# Patient Record
Sex: Male | Born: 1935 | Race: White | Hispanic: No | Smoking: Current every day smoker
Health system: Southern US, Community
[De-identification: ages and names within clinical notes are randomized; demographics above are authoritative.]

## PROBLEM LIST (undated history)

## (undated) DIAGNOSIS — D649 Anemia, unspecified: Secondary | ICD-10-CM

## (undated) DIAGNOSIS — D126 Benign neoplasm of colon, unspecified: Secondary | ICD-10-CM

## (undated) DIAGNOSIS — E785 Hyperlipidemia, unspecified: Secondary | ICD-10-CM

## (undated) DIAGNOSIS — L259 Unspecified contact dermatitis, unspecified cause: Secondary | ICD-10-CM

## (undated) DIAGNOSIS — K5730 Diverticulosis of large intestine without perforation or abscess without bleeding: Secondary | ICD-10-CM

## (undated) DIAGNOSIS — N529 Male erectile dysfunction, unspecified: Secondary | ICD-10-CM

## (undated) DIAGNOSIS — I10 Essential (primary) hypertension: Secondary | ICD-10-CM

## (undated) DIAGNOSIS — I4891 Unspecified atrial fibrillation: Secondary | ICD-10-CM

## (undated) DIAGNOSIS — C4491 Basal cell carcinoma of skin, unspecified: Secondary | ICD-10-CM

## (undated) DIAGNOSIS — J45909 Unspecified asthma, uncomplicated: Secondary | ICD-10-CM

## (undated) DIAGNOSIS — M109 Gout, unspecified: Secondary | ICD-10-CM

## (undated) HISTORY — PX: TONSILLECTOMY: SUR1361

## (undated) HISTORY — DX: Diverticulosis of large intestine without perforation or abscess without bleeding: K57.30

## (undated) HISTORY — DX: Unspecified asthma, uncomplicated: J45.909

## (undated) HISTORY — DX: Unspecified contact dermatitis, unspecified cause: L25.9

## (undated) HISTORY — DX: Anemia, unspecified: D64.9

## (undated) HISTORY — DX: Unspecified atrial fibrillation: I48.91

## (undated) HISTORY — DX: Essential (primary) hypertension: I10

## (undated) HISTORY — DX: Hyperlipidemia, unspecified: E78.5

## (undated) HISTORY — DX: Gout, unspecified: M10.9

## (undated) HISTORY — DX: Benign neoplasm of colon, unspecified: D12.6

## (undated) HISTORY — DX: Male erectile dysfunction, unspecified: N52.9

## (undated) HISTORY — DX: Basal cell carcinoma of skin, unspecified: C44.91

---

## 1997-10-01 ENCOUNTER — Ambulatory Visit (HOSPITAL_BASED_OUTPATIENT_CLINIC_OR_DEPARTMENT_OTHER): Admission: RE | Admit: 1997-10-01 | Discharge: 1997-10-01 | Payer: Self-pay | Admitting: Plastic Surgery

## 2000-12-25 ENCOUNTER — Encounter: Payer: Self-pay | Admitting: Emergency Medicine

## 2000-12-25 ENCOUNTER — Emergency Department (HOSPITAL_COMMUNITY): Admission: EM | Admit: 2000-12-25 | Discharge: 2000-12-25 | Payer: Self-pay | Admitting: *Deleted

## 2003-01-09 ENCOUNTER — Encounter: Admission: RE | Admit: 2003-01-09 | Discharge: 2003-01-09 | Payer: Self-pay

## 2003-04-30 ENCOUNTER — Encounter (INDEPENDENT_AMBULATORY_CARE_PROVIDER_SITE_OTHER): Payer: Self-pay | Admitting: Specialist

## 2003-04-30 ENCOUNTER — Ambulatory Visit (HOSPITAL_COMMUNITY): Admission: RE | Admit: 2003-04-30 | Discharge: 2003-04-30 | Payer: Self-pay | Admitting: Gastroenterology

## 2007-08-06 ENCOUNTER — Ambulatory Visit: Payer: Self-pay | Admitting: Vascular Surgery

## 2010-05-17 ENCOUNTER — Encounter: Payer: Self-pay | Admitting: Internal Medicine

## 2010-05-17 ENCOUNTER — Encounter (INDEPENDENT_AMBULATORY_CARE_PROVIDER_SITE_OTHER): Payer: Self-pay | Admitting: *Deleted

## 2010-05-17 ENCOUNTER — Ambulatory Visit (INDEPENDENT_AMBULATORY_CARE_PROVIDER_SITE_OTHER): Payer: Medicare Other | Admitting: Internal Medicine

## 2010-05-17 ENCOUNTER — Other Ambulatory Visit: Payer: Medicare Other

## 2010-05-17 ENCOUNTER — Other Ambulatory Visit: Payer: Self-pay | Admitting: Internal Medicine

## 2010-05-17 DIAGNOSIS — M109 Gout, unspecified: Secondary | ICD-10-CM | POA: Insufficient documentation

## 2010-05-17 DIAGNOSIS — I10 Essential (primary) hypertension: Secondary | ICD-10-CM

## 2010-05-17 DIAGNOSIS — N529 Male erectile dysfunction, unspecified: Secondary | ICD-10-CM | POA: Insufficient documentation

## 2010-05-17 DIAGNOSIS — K5730 Diverticulosis of large intestine without perforation or abscess without bleeding: Secondary | ICD-10-CM | POA: Insufficient documentation

## 2010-05-17 DIAGNOSIS — L259 Unspecified contact dermatitis, unspecified cause: Secondary | ICD-10-CM | POA: Insufficient documentation

## 2010-05-17 DIAGNOSIS — E785 Hyperlipidemia, unspecified: Secondary | ICD-10-CM

## 2010-05-17 DIAGNOSIS — J45909 Unspecified asthma, uncomplicated: Secondary | ICD-10-CM | POA: Insufficient documentation

## 2010-05-17 DIAGNOSIS — D126 Benign neoplasm of colon, unspecified: Secondary | ICD-10-CM | POA: Insufficient documentation

## 2010-05-17 DIAGNOSIS — C4491 Basal cell carcinoma of skin, unspecified: Secondary | ICD-10-CM | POA: Insufficient documentation

## 2010-05-17 LAB — HEPATIC FUNCTION PANEL
ALT: 18 U/L (ref 0–53)
AST: 17 U/L (ref 0–37)
Albumin: 4.1 g/dL (ref 3.5–5.2)
Alkaline Phosphatase: 73 U/L (ref 39–117)
Bilirubin, Direct: 0 mg/dL (ref 0.0–0.3)
Total Bilirubin: 0.6 mg/dL (ref 0.3–1.2)
Total Protein: 7.1 g/dL (ref 6.0–8.3)

## 2010-05-17 LAB — LIPID PANEL
Cholesterol: 245 mg/dL — ABNORMAL HIGH (ref 0–200)
HDL: 36.4 mg/dL — ABNORMAL LOW (ref >39.00)
Total CHOL/HDL Ratio: 7
Triglycerides: 351 mg/dL — ABNORMAL HIGH (ref 0.0–149.0)
VLDL: 70.2 mg/dL — ABNORMAL HIGH (ref 0.0–40.0)

## 2010-05-17 LAB — BASIC METABOLIC PANEL
BUN: 28 mg/dL — ABNORMAL HIGH (ref 6–23)
CO2: 29 mEq/L (ref 19–32)
Calcium: 9.1 mg/dL (ref 8.4–10.5)
Chloride: 103 mEq/L (ref 96–112)
Creatinine, Ser: 1.2 mg/dL (ref 0.4–1.5)
GFR: 62.25 mL/min (ref >60.00)
Glucose, Bld: 77 mg/dL (ref 70–99)
Potassium: 4.7 mEq/L (ref 3.5–5.1)
Sodium: 141 mEq/L (ref 135–145)

## 2010-05-17 LAB — URIC ACID: Uric Acid, Serum: 8.3 mg/dL — ABNORMAL HIGH (ref 4.0–7.8)

## 2010-05-18 ENCOUNTER — Telehealth: Payer: Self-pay | Admitting: Internal Medicine

## 2010-05-18 LAB — LDL CHOLESTEROL, DIRECT: Direct LDL: 150.5 mg/dL

## 2010-05-24 ENCOUNTER — Ambulatory Visit (INDEPENDENT_AMBULATORY_CARE_PROVIDER_SITE_OTHER): Payer: Medicare Other | Admitting: Internal Medicine

## 2010-05-24 ENCOUNTER — Encounter: Payer: Self-pay | Admitting: Internal Medicine

## 2010-05-24 DIAGNOSIS — I10 Essential (primary) hypertension: Secondary | ICD-10-CM

## 2010-05-24 DIAGNOSIS — E785 Hyperlipidemia, unspecified: Secondary | ICD-10-CM

## 2010-05-24 DIAGNOSIS — N529 Male erectile dysfunction, unspecified: Secondary | ICD-10-CM

## 2010-05-24 DIAGNOSIS — M109 Gout, unspecified: Secondary | ICD-10-CM

## 2010-05-27 NOTE — Progress Notes (Signed)
  Phone Note Outgoing Call   Reason for Call: Get patient information Summary of Call: I was supposed to have called in a 30 day supply of his lisinopril/hct 20/12.5 but did not and do not remember his pharmacy. Help please.  Thanks Initial call taken by: Jacques Navy MD,  May 18, 2010 8:31 AM    Prescriptions: LISINOPRIL-HYDROCHLOROTHIAZIDE 20-12.5 MG TABS (LISINOPRIL-HYDROCHLOROTHIAZIDE) 1 tablet every morning  #30 x 6   Entered by:   Ami Bullins CMA   Authorized by:   Jacques Navy MD   Signed by:   Bill Salinas CMA on 05/18/2010   Method used:   Electronically to        [id number] (retail)       [address]       [place], [place]  [address]       Ph: [phone]       Fax: [phone]   RxID:   [id number] LISINOPRIL-HYDROCHLOROTHIAZIDE 20-12.5 MG TABS (LISINOPRIL-HYDROCHLOROTHIAZIDE) 1 tablet every morning  #30 x 6   Entered and Authorized by:   Ami Bullins CMA   Signed by:   Ami Bullins CMA on 05/18/2010   Method used:   Print then Give to Patient   RxID:   [id number]

## 2010-05-27 NOTE — Assessment & Plan Note (Signed)
Summary: new pt/bcbs/medicare/#/lb   Vital Signs:  Patient profile:   77 year old male Height:      67.5 inches Weight:      167 pounds BMI:     25.86 O2 Sat:      97 % on Room air Temp:     97.4 degrees F oral Pulse rate:   62 / minute BP sitting:   190 / 72  (left arm) Cuff size:   regular  Vitals Entered By: Bill Salinas CMA (May 17, 2010 3:27 PM)  O2 Flow:  Room air  Primary Care Provider:  Illene Regulus   History of Present Illness: Mr. Devereux presents to establish for on-going continuity care after his physicians closed their office.  cc: Uncontrolled blood pressure. His BP is high but he is asymptomatic.   He has no complaints otherwise. He is not a "doctor goer" and considers himself to be in good health. He has not had an eye exam in years. He doesn't recall when he last had any lab work. He reports he has had a colonoscopy in the last 10 years.     Preventive Screening-Counseling & Management  Alcohol-Tobacco     Alcohol drinks/day: <1     Alcohol type: beer, whiskey     Smoking Status: current     Packs/Day: 1.0  Caffeine-Diet-Exercise     Caffeine use/day: 1 cup per day     Does Patient Exercise: no  Hep-HIV-STD-Contraception     Dental Visit-last 6 months no     Sun Exposure-Excessive: no  Safety-Violence-Falls     Seat Belt Use: yes     Helmet Use: n/a     Smoke Detectors: yes     Violence in the Home: no risk noted     Sexual Abuse: no     Fall Risk: low fall risk      Blood Transfusions:  no.    Current Medications (verified): 1)  Lisinopril-Hydrochlorothiazide 20-12.5 Mg Tabs (Lisinopril-Hydrochlorothiazide) .Marland Kitchen.. 1 Tablet Every Morning  Allergies (verified): 1)  ! Prednisone 2)  ! Codeine 3)  ! Neosporin 4)  ! * Topical Steroids  Past History:  Past Medical History: ERECTILE DYSFUNCTION, ORGANIC (ICD-607.84) CONTACT DERMATITIS&OTHER ECZEMA DUE UNSPEC CAUSE (ICD-692.9) COLONIC POLYPS, ADENOMATOUS (ICD-211.3) GOUT,  UNSPECIFIED (ICD-274.9) HYPERLIPIDEMIA (ICD-272.4) LABILE HYPERTENSION (ICD-401.9) DIVERTICULOSIS OF COLON (ICD-562.10) BASAL CELL CARCINOMA OF SKIN SITE UNSPECIFIED (ICD-173.91) ASTHMA, CHILDHOOD (ICD-493.00)     Physician Roser          Cardiologist - Dr. Peter Swaziland           Dermatologist - Dr. Danella Deis            Past Surgical History: Tonsillectomy  Family History: Father- deceased @ 68: CAD/MI Mother - deceased @ 20 - CVA, HTN Neg- prostate or colon cancer; DM  Social History: [employer], [employer] then [employer]; Master's [employer] - mathematics Married '62 2 dtrs - '67, '70: no grandchildren work- high school and college Editor, commissioning - retired. 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 Marriage in good health; wife with COPDSmoking Status:  current Packs/Day:  1.0 Caffeine use/day:  1 cup per day Does Patient Exercise:  no Dental Care w/in 6 mos.:  no Sun Exposure-Excessive:  no Seat Belt Use:  yes Fall Risk:  low fall risk Blood Transfusions:  no  Review of Systems       The patient complains of decreased hearing.  The patient denies anorexia, fever,  weight loss, weight gain, vision loss, chest pain, syncope, dyspnea on exertion, peripheral edema, prolonged cough, headaches, abdominal pain, severe indigestion/heartburn, incontinence, muscle weakness, difficulty walking, depression, abnormal bleeding, enlarged lymph nodes, and angioedema.    Physical Exam  General:  alert, well-developed, well-nourished, well-hydrated, and normal appearance.   Head:  normocephalic and atraumatic.  Bit of droop to the right eye Eyes:  vision grossly intact, pupils equal, and pupils round.  Both lenses have increased opacity with hand held opthalmoscope. Fundiscopic exam was hindered.  Ears:  R ear normal and L ear normal.   Nose:  no external deformity and no external erythema.   Mouth:  Oral mucosa and oropharynx without lesions or exudates.   Teeth in good repair. Neck:  supple, full ROM, no masses, no thyromegaly, and no carotid bruits.   Chest Wall:  No deformities, masses, tenderness or gynecomastia noted. Lungs:  Normal respiratory effort, chest expands symmetrically. Lungs are clear to auscultation, no crackles or wheezes. Heart:  Normal rate and regular rhythm. S1 and S2 normal without gallop, murmur, click, rub or other extra sounds. Abdomen:  soft, non-tender, normal bowel sounds, and no guarding.   Msk:  normal ROM, no joint tenderness, no joint swelling, and no joint instability.   Pulses:  2+ radial pulse Extremities:  No clubbing, cyanosis, edema, or deformity noted with normal full range of motion of all joints.   Neurologic:  alert & oriented X3, cranial nerves II-XII intact, strength normal in all extremities, sensation intact to light touch, gait normal, and DTRs symmetrical and normal.   Skin:  turgor normal, color normal, and no suspicious lesions.   Cervical Nodes:  no anterior cervical adenopathy and no posterior cervical adenopathy.   Psych:  Oriented X3, memory intact for recent and remote, normally interactive, good eye contact, and not anxious appearing.     Impression & Recommendations:  Problem # 1:  ERECTILE DYSFUNCTION, ORGANIC (ICD-607.84) INterested in a trial of viagra. Will provide samples at next office visit.  Problem # 2:  COLONIC POLYPS, ADENOMATOUS (ICD-211.3) By report he had precancerous polyp(s). He was to have repeat colonoscopy 5 years from the date of his last study.   Problem # 3:  GOUT, UNSPECIFIED (ICD-274.9) Reports that he has less than one flare a year. Currently asymptomatic.  Plan - uric acid level for baseline.   Orders: TLB-Uric Acid, Blood (84550-URIC)  Addendum - uric acid 8.3 - high.  Plan  low purine diet         for more than 2 flares in 12 months will recommend allopurinol.  Problem # 4:  HYPERLIPIDEMIA (ICD-272.4) Reports this diagnosis but doesn't know his  last lipid readings and hasn't had lab for approximately 1 year.  Plan - lab with recommendations to follow.  Orders: TLB-Lipid Panel (80061-LIPID) TLB-Hepatic/Liver Function Pnl (80076-HEPATIC)  addendum - LDL 150 - greater than goal of 130 or less but not at NCEP treatment threshold for low-moderate risk patient.  Plan - life style managment with low fat diet and regular aerobic exercise.  Problem # 5:  LABILE HYPERTENSION (ICD-401.9)  His updated medication list for this problem includes:    Lisinopril-hydrochlorothiazide 20-12.5 Mg Tabs (Lisinopril-hydrochlorothiazide) .Marland Kitchen... 1 tablet every morning  Orders: TLB-BMP (Basic Metabolic Panel-BMET) (80048-METABOL)  BP today: 190/72; on Doctor's repeat: 178/80 right arm; 164/84 left arm.  Plan - trial of Lisinopril/HCT 40/25 (doubling his present dose)          return in 1 week for follow-up  routine lab- Bmet  Problem # 6:  Preventive Health Care (ICD-V70.0) Does not have any complaints except for high BP. His exam is suggestive of cataracts and he admits to increased light refraction and slight dimming of vision. He is advised to see an opthalmologist. Limited physical exam was unremarkable. Lab results are within normal limits except for moderately elevated LDL cholesterol. Immunizations: tetnus May '10; shingles Nov '11; due for pneumonia vaccine. Colorectal cancer screening - will request copy of last colonoscopy from patient with recommendations to follow.   Complete Medication List: 1)  Lisinopril-hydrochlorothiazide 20-12.5 Mg Tabs (Lisinopril-hydrochlorothiazide) .Marland Kitchen.. 1 tablet every morning   Patient: Christopher Rubio Note: All result statuses are Final unless otherwise noted.  Tests: (1) Lipid Panel (LIPID)   Cholesterol          [H]  245 mg/dL                   4-132     ATP III Classification            Desirable:  < 200 mg/dL                    Borderline High:  200 - 239 mg/dL               High:  > = 240  mg/dL   Triglycerides        [H]  351.0 mg/dL                 4.4-010.2     Normal:  <150 mg/dL     Borderline High:  725 - 199 mg/dL   HDL                  [L]  36.64 mg/dL                 >40.34   VLDL Cholesterol     [H]  70.2 mg/dL                  7.4-25.9  CHO/HDL Ratio:  CHD Risk                             7                    Men          Women     1/2 Average Risk     3.4          3.3     Average Risk          5.0          4.4     2X Average Risk          9.6          7.1     3X Average Risk          15.0          11.0                           Tests: (2) Hepatic/Liver Function Panel (HEPATIC)   Total Bilirubin           0.6 mg/dL                   5.6-3.8   Direct Bilirubin  0.0 mg/dL                   1.6-1.0   Alkaline Phosphatase      73 U/L                      39-117   AST                       17 U/L                      0-37   ALT                       18 U/L                      0-53   Total Protein             7.1 g/dL                    9.6-0.4   Albumin                   4.1 g/dL                    5.4-0.9  Tests: (3) Uric Acid (URIC)   Uric Acid            [H]  8.3 mg/dL                   8.1-1.9  Tests: (4) BMP (METABOL)   Sodium                    141 mEq/L                   135-145   Potassium                 4.7 mEq/L                   3.5-5.1   Chloride                  103 mEq/L                   96-112   Carbon Dioxide            29 mEq/L                    19-32   Glucose                   77 mg/dL                    14-78   BUN                  [H]  28 mg/dL                    2-95   Creatinine                1.2 mg/dL                   6.2-1.3   Calcium                   9.1 mg/dL  8.4-10.5   GFR                       62.25 mL/min                >60.00  Tests: (5) Cholesterol LDL - Direct (DIRLDL)  Cholesterol LDL - Direct                             150.5 mg/dL     Optimal:  <478 mg/dL     Near or Above Optimal:   100-129 mg/dL     Borderline High:  295-621 mg/dL     High:  308-657 mg/dL     Very High:  >846 mg/dL  Orders Added: 1)  TLB-Lipid Panel [80061-LIPID] 2)  TLB-Hepatic/Liver Function Pnl [80076-HEPATIC] 3)  TLB-Uric Acid, Blood [84550-URIC] 4)  TLB-BMP (Basic Metabolic Panel-BMET) [80048-METABOL] 5)  New Patient Level IV [96295]   Immunization History:  Zostavax History:    Zostavax # 1:  zostavax (02/13/2010)   Immunization History:  Zostavax History:    Zostavax # 1:  Zostavax (02/13/2010)   Preventive Care Screening  Last Tetanus Booster:    Date:  08/10/2008    Results:  Historical

## 2010-06-01 ENCOUNTER — Encounter: Payer: Self-pay | Admitting: Internal Medicine

## 2010-06-02 NOTE — Assessment & Plan Note (Signed)
Summary: bp/fu/lb   Vital Signs:  Patient profile:   77 year old male Height:      67.5 inches Weight:      166 pounds BMI:     25.71 O2 Sat:      96 % on Room air Temp:     97.4 degrees F oral Pulse rate:   58 / minute BP sitting:   180 / 84  (left arm) Cuff size:   regular  Vitals Entered By: Bill Salinas CMA (May 24, 2010 9:06 AM)  O2 Flow:  Room air CC: follow-up visit on bp/ ab   Primary Care Provider:  Illene Regulus  CC:  follow-up visit on bp/ ab.  History of Present Illness: Mr. Barca returns for follow-up of blood pressure. He was recently seen as a new patient and that record is reviewed. He is seen with the assistance of Candelaria Celeste, NP-student.  His blood pressure remains elevated despite increase in lisinopril/hct. He is asymptomatic.  Reviewed his labs: LDL was 150 range. Discussed NCEP guidelines: treatment threshold of 160+.  He has a h/o gout and uric acid level was elevated on his lab - 8.6. He does not have frequent flares.   Current Medications (verified): 1)  Lisinopril-Hydrochlorothiazide 20-12.5 Mg Tabs (Lisinopril-Hydrochlorothiazide) .Marland Kitchen.. 1 Tablet Every Morning 2)  Vitamin D .... 1 Daily 3)  Aspirin 81 Mg Tbec (Aspirin) .Marland Kitchen.. 1 Tablet Once A Day 4)  Vitamin B-12 1000 Mcg Tabs (Cyanocobalamin) 5)  Fish Oil 1000 Mg Caps (Omega-3 Fatty Acids) .Marland Kitchen.. 1 Capsule Daiily  Allergies (verified): 1)  ! Prednisone 2)  ! Codeine 3)  ! Neosporin 4)  ! * Topical Steroids PMH-FH-SH reviewed-no changes except otherwise noted  Review of Systems  The patient denies fever, weight loss, chest pain, dyspnea on exertion, headaches, abdominal pain, severe indigestion/heartburn, incontinence, muscle weakness, difficulty walking, and abnormal bleeding.    Physical Exam  General:  Well-developed,well-nourished,in no acute distress; alert,appropriate and cooperative throughout examination Head:  normocephalic and atraumatic.   Eyes:  C&S clear Lungs:   normal respiratory effort and normal breath sounds.   Heart:  normal rate and regular rhythm.     Impression & Recommendations:  Problem # 1:  ERECTILE DYSFUNCTION, ORGANIC (EAV-409.81) Patient had raised this issue at his inital visit. He has not had chest pain and does use any nitrate product  Plan - trail of viagra - sample provied - instructed to take 1/2 100mg  tab to start.  Problem # 2:  GOUT, UNSPECIFIED (ICD-274.9) No recent flares. Uric acid elevated per HPI  Plan - he is provided information about a low purine diet           will not start allopurinol unless he begins to have more flares           wil decrease HCT to previous low dose.  Problem # 3:  HYPERLIPIDEMIA (ICD-272.4)  Labs Reviewed: SGOT: 17 (05/17/2010)   SGPT: 18 (05/17/2010)   HDL:36.40 (05/17/2010)  LDL 150.5 (05/17/2010 Chol:245 (05/17/2010)  Trig:351.0 (05/17/2010)  Plan - patient provided information on low cholesterol diet           recheck lipid panel in  6 months.  Problem # 4:  LABILE HYPERTENSION (ICD-401.9) Blood pressure remains poorly controlled. He is asymptomatic.  Plan - resume lisinopril/hct at 20/12.5 once daily            add taztia XT 180 mg once daily.  return for BP check in 1-2 weeks, bring home monitor for calibration  His updated medication list for this problem includes:    Lisinopril-hydrochlorothiazide 20-12.5 Mg Tabs (Lisinopril-hydrochlorothiazide) .Marland Kitchen... 1 tablet every morning    Taztia Xt 180 Mg Xr24h-cap (Diltiazem hcl er beads) .Marland Kitchen... 1 by mouth once daily for blood pressure  Complete Medication List: 1)  Lisinopril-hydrochlorothiazide 20-12.5 Mg Tabs (Lisinopril-hydrochlorothiazide) .Marland Kitchen.. 1 tablet every morning 2)  Vitamin D  .... 1 daily 3)  Aspirin 81 Mg Tbec (Aspirin) .Marland Kitchen.. 1 tablet once a day 4)  Vitamin B-12 1000 Mcg Tabs (Cyanocobalamin) 5)  Fish Oil 1000 Mg Caps (Omega-3 fatty acids) .Marland Kitchen.. 1 capsule daiily 6)  Taztia Xt 180 Mg Xr24h-cap (Diltiazem hcl er  beads) .Marland Kitchen.. 1 by mouth once daily for blood pressure Prescriptions: TAZTIA XT 180 MG XR24H-CAP (DILTIAZEM HCL ER BEADS) 1 by mouth once daily for blood pressure  #30 x 12   Entered and Authorized by:   Jacques Navy MD   Signed by:   Jacques Navy MD on 05/24/2010   Method used:   Electronically to        [facility] (retail)       [address]       [place], [place]  [address]       Ph: [phone]       Fax: [phone]   RxID:   [id number]    Orders Added: 1)  Est. Patient Level III [40102]

## 2010-06-07 ENCOUNTER — Ambulatory Visit: Payer: Medicare Other

## 2010-06-07 ENCOUNTER — Encounter: Payer: Self-pay | Admitting: Internal Medicine

## 2010-06-22 NOTE — Assessment & Plan Note (Signed)
Summary: bp check  Nurse Visit   Vital Signs:  Patient profile:   77 year old male BP sitting:   156 / 70  (left arm) Cuff size:   regular  Vitals Entered By: Bill Salinas CMA (June 07, 2010 9:15 AM)  Allergies: 1)  ! Prednisone 2)  ! Codeine 3)  ! Neosporin 4)  ! * Topical Steroids

## 2010-06-22 NOTE — Consult Note (Signed)
Summary: [facility]   Imported By: Sherian Rein 06/15/2010 12:44:37  _____________________________________________________________________  External Attachment:    Type:   Image     Comment:   External Document

## 2010-08-27 NOTE — Op Note (Signed)
NAME:  Christopher Rubio, Christopher Rubio                        ACCOUNT NO.:  [id number]   MEDICAL RECORD NO.:  [id number]                   PATIENT TYPE:  AMB   LOCATION:  ENDO                                 FACILITY:  [facility]   PHYSICIAN:  Anselmo Rod, M.D.               DATE OF BIRTH:  [birth year]   DATE OF PROCEDURE:  04/30/2003  DATE OF DISCHARGE:                                 OPERATIVE REPORT   PROCEDURE:  Colonoscopy with cold biopsies x6.   ENDOSCOPIST:  Anselmo Rod, M.D.   INSTRUMENT USED:  Olympus video colonoscope.   INDICATIONS FOR PROCEDURE:  A 77 year old white male undergoing screening  colonoscopy, rule out colonic polyps, masses, etc.   PREPROCEDURE PREPARATION:  Informed consent was obtained from the patient  and the patient was  fasted for 8 hours prior to  the procedure and prepped  with a bottle of magnesium citrate and a gallon of GoLYTELY the  night prior  to the procedure.   PREPROCEDURE PHYSICAL:  The patient had stable vital signs. Neck supple.  Chest clear to auscultation, S1, S2 regular. Abdomen soft with normoactive  bowel sounds.   DESCRIPTION OF PROCEDURE:  The patient was placed in the left lateral  decubitus position and sedated with 80 mg of Demerol and 8 mg of Versed  intravenously. Once sedation was adequate, the patient was maintained on low  flow oxygen and continuous cardiac monitoring.   The Olympus video colonoscope was advanced from the rectum to the cecum  without difficulty. The patient had scattered diverticula throughout the  colon with some diverticula in the proximal right and distal left colon.  Some of the diverticula had stool impacted in them. Prominent internal  hemorrhoids were seen on retroflexion. These were not bleeding at the time  of examination.   Four small  sessile polyps were biopsied from 20 cm. The terminal ileum  appeared  normal. The appendiceal orifice and ileocecal valve were clearly  visualized and  photographed.  The patient tolerated the procedure well  without complications.   IMPRESSION:  1. Prominent internal hemorrhoids.  2. Four small sessile polyps biopsied from 20 cm.  3. Scattered diverticulosis with small prominent change in the proximal     right and distal left colon.  4. Normal terminal ileum.   RECOMMENDATIONS:  1. Await pathology results.  2. Avoid nonsteroidals including  aspirin .  3. A high fiber diet with liberal fluid intake has been advocated. Brochures     on diverticulosis have been given to the patient for his education.  4. Outpatient follow up in the next 2 weeks. Repeat  colorectal cancer     screening will be scheduled depending on pathology results.   RECOMMENDATIONS:  Anselmo Rod, M.D.    JNM/MEDQ  D:  04/30/2003  T:  04/30/2003  Job:  [id number]   cc:   Talmadge Coventry, M.D.  [address]  [place]  [place] [address]  Fax: [phone]

## 2010-12-07 ENCOUNTER — Other Ambulatory Visit: Payer: Self-pay | Admitting: Internal Medicine

## 2011-03-21 ENCOUNTER — Other Ambulatory Visit: Payer: Self-pay | Admitting: Internal Medicine

## 2011-05-19 ENCOUNTER — Encounter: Payer: Self-pay | Admitting: Internal Medicine

## 2011-05-19 ENCOUNTER — Ambulatory Visit (INDEPENDENT_AMBULATORY_CARE_PROVIDER_SITE_OTHER): Payer: Medicare Other | Admitting: Internal Medicine

## 2011-05-19 DIAGNOSIS — M19071 Primary osteoarthritis, right ankle and foot: Secondary | ICD-10-CM

## 2011-05-19 DIAGNOSIS — I779 Disorder of arteries and arterioles, unspecified: Secondary | ICD-10-CM

## 2011-05-19 DIAGNOSIS — M19079 Primary osteoarthritis, unspecified ankle and foot: Secondary | ICD-10-CM

## 2011-05-19 DIAGNOSIS — I739 Peripheral vascular disease, unspecified: Secondary | ICD-10-CM

## 2011-05-19 MED ORDER — CILOSTAZOL 50 MG PO TABS: 50.0000 mg | ORAL_TABLET | Freq: Two times a day (BID) | ORAL | Status: AC

## 2011-05-19 NOTE — Patient Instructions (Signed)
1. Tender toe joints - most likely osteoarthritis. Use it or loose it - ok to walk, etc. For pain ok to use OTC anti-inflammatory, e.g. Ibuprofen  2. Leg pain with exertion = claudication. On exam weak pulses and femoral bruits suggestive of blocked arteries. Plan - do nothing, or a study - CT or MR angio or trial medication, i.e. Pletal 50mg  twice a day.   Osteoarthritis Osteoarthritis is the most common form of arthritis. It is redness, soreness, and swelling (inflammation) affecting the cartilage. Cartilage acts as a cushion, covering the ends of bones where they meet to form a joint. CAUSES   Over time, the cartilage begins to wear away. This causes bone to rub on bone. This produces pain and stiffness in the affected joints. Factors that contribute to this problem are:  Excessive body weight.     Age.    Overuse of joints.  SYMPTOMS    People with osteoarthritis usually experience joint pain, swelling, or stiffness.     Over time, the joint may lose its normal shape.     Small deposits of bone (osteophytes) may grow on the edges of the joint.     Bits of bone or cartilage can break off and float inside the joint space. This may cause more pain and damage.     Osteoarthritis can lead to depression, anxiety, feelings of helplessness, and limitations on daily activities.  The most commonly affected joints are in the:  Ends of the fingers.     Thumbs.    Neck.    Lower back.     Knees.    Hips.  DIAGNOSIS  Diagnosis is mostly based on your symptoms and exam. Tests may be helpful, including:  X-rays of the affected joint.     A computerized magnetic scan (MRI).     Blood tests to rule out other types of arthritis.     Joint fluid tests. This involves using a needle to draw fluid from the joint and examining the fluid under a microscope.  TREATMENT   Goals of treatment are to control pain, improve joint function, maintain a normal body weight, and maintain a healthy  lifestyle. Treatment approaches may include:  A prescribed exercise program with rest and joint relief.     Weight control with nutritional education.     Pain relief techniques such as:     Properly applied heat and cold.     Electric pulses delivered to nerve endings under the skin (transcutaneous electrical nerve stimulation, TENS).     Massage.    Certain supplements. Ask your caregiver before using any supplements, especially in combination with prescribed drugs.     Medicines to control pain, such as:     Acetaminophen.    Nonsteroidal anti-inflammatory drugs (NSAIDs), such as naproxen.     Narcotic or central-acting agents, such as tramadol. This drug carries a risk of addiction and is generally prescribed for short-term use.     Corticosteroids. These can be given orally or as injection. This is a short-term treatment, not recommended for routine use.     Surgery to reposition the bones and relieve pain (osteotomy) or to remove loose pieces of bone and cartilage. Joint replacement may be needed in advanced states of osteoarthritis.  HOME CARE INSTRUCTIONS   Your caregiver can recommend specific types of exercise. These may include:  Strengthening exercises. These are done to strengthen the muscles that support joints affected by arthritis. They can be performed with weights  or with exercise bands to add resistance.     Aerobic activities. These are exercises, such as brisk walking or low-impact aerobics, that get your heart pumping. They can help keep your lungs and circulatory system in shape.     Range-of-motion activities. These keep your joints limber.     Balance and agility exercises. These help you maintain daily living skills.  Learning about your condition and being actively involved in your care will help improve the course of your osteoarthritis. SEEK MEDICAL CARE IF:    You feel hot or your skin turns red.     You develop a rash in addition to your joint  pain.     You have an oral temperature above 102 F (38.9 C).  FOR MORE INFORMATION   National Institute of Arthritis and Musculoskeletal and Skin Diseases: www.niams.http://www.myers.net/ [employer] on Aging: https://walker.com/ American College of Rheumatology: www.rheumatology.org Document Released: 03/28/2005 Document Revised: 12/08/2010 Document Reviewed: 07/09/2009 [facility] Patient Information 2012 [place], [place].   Peripheral Vascular Disease Peripheral Vascular Disease (PVD), also called Peripheral Arterial Disease (PAD), is a circulation problem caused by cholesterol (atherosclerotic plaque) deposits in the arteries. PVD commonly occurs in the lower extremities (legs) but it can occur in other areas of the body, such as your arms. The cholesterol buildup in the arteries reduces blood flow which can cause pain and other serious problems. The presence of PVD can place a person at risk for Coronary Artery Disease (CAD).   CAUSES   Causes of PVD can be many. It is usually associated with more than one risk factor such as:    High Cholesterol.     Smoking.    Diabetes.    Lack of exercise or inactivity.     High blood pressure (hypertension).     Obesity.    Family history.  SYMPTOMS    When the lower extremities are affected, patients with PVD may experience:     Leg pain with exertion or physical activity. This is called INTERMITTENT CLAUDICATION. This may present as cramping or numbness with physical activity. The location of the pain is associated with the level of blockage. For example, blockage at the abdominal level (distal abdominal aorta) may result in buttock or hip pain. Lower leg arterial blockage may result in calf pain.     As PVD becomes more severe, pain can develop with less physical activity.     In people with severe PVD, leg pain may occur at rest.     Other PVD signs and symptoms:     Leg numbness or weakness.     Coldness in the affected leg or foot,  especially when compared to the other leg.     A change in leg color.     Patients with significant PVD are more prone to ulcers or sores on toes, feet or legs. These may take longer to heal or may reoccur. The ulcers or sores can become infected.     If signs and symptoms of PVD are ignored, gangrene may occur. This can result in the loss of toes or loss of an entire limb.     Not all leg pain is related to PVD. Other medical conditions can cause leg pain such as:     Blood clots (embolism) or Deep Vein Thrombosis.     Inflammation of the blood vessels (vasculitis).     Spinal stenosis.  DIAGNOSIS   Diagnosis of PVD can involve several different types of tests. These can  include:  Pulse Volume Recording Method (PVR). This test is simple, painless and does not involve the use of X-rays. PVR involves measuring and comparing the blood pressure in the arms and legs. An ABI (Ankle-Brachial Index) is calculated. The normal ratio of blood pressures is 1. As this number becomes smaller, it indicates more severe disease.     < 0.95 - indicates significant narrowing in one or more leg vessels.     <0.8 - there will usually be pain in the foot, leg or buttock with exercise.     <0.4 - will usually have pain in the legs at rest.     <0.25 - usually indicates limb threatening PVD.     Doppler detection of pulses in the legs. This test is painless and checks to see if you have a pulses in your legs/feet.     A dye or contrast material (a substance that highlights the blood vessels so they show up on x-ray) may be given to help your caregiver better see the arteries for the following tests. The dye is eliminated from your body by the kidney's. Your caregiver may order blood work to check your kidney function and other laboratory values before the following tests are performed:     Magnetic Resonance Angiography (MRA). An MRA is a picture study of the blood vessels and arteries. The MRA machine uses  a large magnet to produce images of the blood vessels.     Computed Tomography Angiography (CTA). A CTA is a specialized x-ray that looks at how the blood flows in your blood vessels. An IV may be inserted into your arm so contrast dye can be injected.     Angiogram. Is a procedure that uses x-rays to look at your blood vessels. This procedure is minimally invasive, meaning a small incision (cut) is made in your groin. A small tube (catheter) is then inserted into the artery of your groin. The catheter is guided to the blood vessel or artery your caregiver wants to examine. Contrast dye is injected into the catheter. X-rays are then taken of the blood vessel or artery. After the images are obtained, the catheter is taken out.  TREATMENT   Treatment of PVD involves many interventions which may include:  Lifestyle changes:     Quitting smoking.     Exercise.    Following a low fat, low cholesterol diet.     Control of diabetes.     Foot care is very important to the PVD patient. Good foot care can help prevent infection.     Medication:    Cholesterol-lowering medicine.     Blood pressure medicine.     Anti-platelet drugs.     Certain medicines may reduce symptoms of Intermittent Claudication.     Interventional/Surgical options:     Angioplasty. An Angioplasty is a procedure that inflates a balloon in the blocked artery. This opens the blocked artery to improve blood flow.     Stent Implant. A wire mesh tube (stent) is placed in the artery. The stent expands and stays in place, allowing the artery to remain open.     Peripheral Bypass Surgery. This is a surgical procedure that reroutes the blood around a blocked artery to help improve blood flow. This type of procedure may be performed if Angioplasty or stent implants are not an option.  SEEK IMMEDIATE MEDICAL CARE IF:    You develop pain or numbness in your arms or legs.     Your  arm or leg turns cold, becomes blue in color.      You develop redness, warmth, swelling and pain in your arms or legs.  MAKE SURE YOU:    Understand these instructions.     Will watch your condition.     Will get help right away if you are not doing well or get worse.  Document Released: 05/05/2004 Document Revised: 12/08/2010 Document Reviewed: 04/01/2008 [facility] Patient Information 2012 [place], [place].

## 2011-05-21 DIAGNOSIS — M19071 Primary osteoarthritis, right ankle and foot: Secondary | ICD-10-CM | POA: Insufficient documentation

## 2011-05-21 DIAGNOSIS — I739 Peripheral vascular disease, unspecified: Secondary | ICD-10-CM | POA: Insufficient documentation

## 2011-05-21 NOTE — Progress Notes (Signed)
  Subjective:    Patient ID: Christopher Rubio, male    DOB: 06/04/1933, 78 y.o.   MRN: [id number]  HPI Christopher Rubio presents with 2 primary c/o: He has pain with initial weight bearing at the plantar aspect of his feet located at the based of his toes. After standing for several minutes the pain abates. He reports that he will develop leg pain with exertion of 15 minutes or more that resolves with rest. He reports that he did have a lower extremity doppler some time ago that by his report was normal.   Past Medical History  Diagnosis Date  . Impotence of organic origin   . Contact dermatitis and other eczema, due to unspecified cause   . Benign neoplasm of colon   . Gout, unspecified   . Other and unspecified hyperlipidemia   . Unspecified essential hypertension   . Diverticulosis of colon (without mention of hemorrhage)   . Basal cell carcinoma of skin, site unspecified   . Extrinsic asthma, unspecified    Past Surgical History  Procedure Date  . Tonsillectomy    Family History  Problem Relation Age of Onset  . Coronary artery disease Father 29    deceased  . Heart attack Father   . Stroke Mother   . Hypertension Mother   . Diabetes Neg Hx   . Cancer Neg Hx     colon, prostate   History   Social History  . Marital Status: Married    Spouse Name: N/A    Number of Children: 2  . Years of Education: N/A   Occupational History  . retired Runner, broadcasting/film/video    Social History Main Topics  . Smoking status: Current Everyday Smoker  . Smokeless tobacco: Not on file  . Alcohol Use: Not on file  . Drug Use: Not on file  . Sexually Active: Not on file   Other Topics Concern  . Not on file   Social History Narrative   [employer], [employer]; Masters [employer]hematicsMarried '622 daughters - '67, '70: no grandchildrenRetired-college math teacherEnjoys retirement: golf, gardening, woodworkingMarriage in good health     Review of Systems System review is negative  for any constitutional, cardiac, pulmonary, GI or neuro symptoms or complaints other than as described in the HPI.     Objective:   Physical Exam Filed Vitals:   05/19/11 1048  BP: 128/68  Pulse: 55  Temp: 98.3 F (36.8 C)  Resp: 16  Weight: 161 lb 8 oz (73.256 kg)  Gen'l - WNWD white man in no distress Pulm - normal respirations Cor - 2+ radial pulse, 1+ femoral pulses L>R with bilateral bruits, trace to absent DP/PT pulses, slow capillary refill noted at toes. No abdominal bruits noted. HR - regular Ext - short toes w/o webbing. No valgus deformity of toes. Tender to pressure at the MTIP joints worse at the 3rd-5th digits.       Assessment & Plan:

## 2011-05-21 NOTE — Assessment & Plan Note (Signed)
Tenderness at the MTP joints with initial weight bearing and on palpation suggestive of OA vs plantar tendonitis.  Plan - supportive footwear           For prolonged discomfort may [place] otc NSAIDs

## 2011-05-21 NOTE — Assessment & Plan Note (Signed)
Symptoms that are classic for claudication with very limited activity time. Physical findings of weak to absent pulses distally along with bruits support the diagnosis of PAD. Offered watchful waiting vs non-invasive angiography vs medical therapy.  Plan - Pletal 50 mg bid.

## 2011-05-30 ENCOUNTER — Other Ambulatory Visit: Payer: Self-pay | Admitting: Internal Medicine

## 2011-06-01 ENCOUNTER — Other Ambulatory Visit: Payer: Self-pay

## 2011-06-01 MED ORDER — DILTIAZEM HCL ER BEADS 180 MG PO CP24: 180.0000 mg | ORAL_CAPSULE | Freq: Every day | ORAL | Status: DC

## 2011-07-02 ENCOUNTER — Other Ambulatory Visit: Payer: Self-pay | Admitting: Internal Medicine

## 2011-09-06 ENCOUNTER — Other Ambulatory Visit: Payer: Self-pay | Admitting: Internal Medicine

## 2011-09-07 NOTE — Telephone Encounter (Signed)
Rx cardiazem sent to [facility]

## 2011-10-10 ENCOUNTER — Other Ambulatory Visit: Payer: Self-pay | Admitting: Internal Medicine

## 2012-04-03 ENCOUNTER — Other Ambulatory Visit: Payer: Self-pay | Admitting: Internal Medicine

## 2012-06-05 ENCOUNTER — Other Ambulatory Visit: Payer: Self-pay | Admitting: *Deleted

## 2012-06-06 MED ORDER — CILOSTAZOL 50 MG PO TABS: 50.0000 mg | ORAL_TABLET | Freq: Two times a day (BID) | ORAL | Status: DC

## 2012-08-22 ENCOUNTER — Other Ambulatory Visit: Payer: Self-pay

## 2012-08-22 MED ORDER — LISINOPRIL-HYDROCHLOROTHIAZIDE 20-12.5 MG PO TABS: 1.0000 | ORAL_TABLET | ORAL | Status: DC

## 2012-09-20 ENCOUNTER — Encounter: Payer: Self-pay | Admitting: Internal Medicine

## 2012-09-20 ENCOUNTER — Ambulatory Visit (INDEPENDENT_AMBULATORY_CARE_PROVIDER_SITE_OTHER): Payer: Medicare Other | Admitting: Internal Medicine

## 2012-09-20 VITALS — BP 190/74 | HR 64 | Temp 97.6°F | Resp 16 | Ht 67.5 in | Wt 159.4 lb

## 2012-09-20 DIAGNOSIS — I739 Peripheral vascular disease, unspecified: Secondary | ICD-10-CM

## 2012-09-20 DIAGNOSIS — I10 Essential (primary) hypertension: Secondary | ICD-10-CM

## 2012-09-20 MED ORDER — LISINOPRIL 20 MG PO TABS: 20.0000 mg | ORAL_TABLET | Freq: Every day | ORAL | Status: DC

## 2012-09-20 MED ORDER — FUROSEMIDE 40 MG PO TABS: 40.0000 mg | ORAL_TABLET | Freq: Every day | ORAL | Status: DC

## 2012-09-20 NOTE — Progress Notes (Signed)
  Subjective:    Patient ID: Christopher Rubio., male    DOB: 17-Jul-1933, 79 y.o.   MRN: [id number]  HPI Christopher Rubio presents due to Rx refill triggering a need for a visit. Last visit was Feb '13 for leg and foot pain. He reports that his symptoms are unchanged despite taking pletal (once a day). He had LE arterial doppler study that was negative.  He has had poor control BP: today SBP 190 with normal diastolic. He is asymptomatic.  PMH, FamHx and SocHx reviewed for any changes and relevance. Current Outpatient Prescriptions on File Prior to Visit  Medication Sig Dispense Refill  . aspirin 81 MG tablet Take 81 mg by mouth daily.      . Cholecalciferol (VITAMIN D PO) Take by mouth daily.      . cilostazol (PLETAL) 50 MG tablet Take 1 tablet (50 mg total) by mouth 2 (two) times daily.  60 tablet  1  . diltiazem (TIAZAC) 180 MG 24 hr capsule TAKE ONE CAPSULE BY MOUTH ONE TIME DAILY  30 capsule  11  . fish oil-omega-3 fatty acids 1000 MG capsule Take 2 g by mouth daily.      Marland Kitchen lisinopril-hydrochlorothiazide (PRINZIDE,ZESTORETIC) 20-12.5 MG per tablet Take 1 tablet by mouth every morning.  30 tablet  0   No current facility-administered medications on file prior to visit.     Review of Systems System review is negative for any constitutional, cardiac, pulmonary, GI or neuro symptoms or complaints other than as described in the HPI.     Objective:   Physical Exam Filed Vitals:   09/20/12 1103  BP: 190/74  Pulse: 64  Temp: 97.6 F (36.4 C)  Resp: 16   BP Readings from Last 3 Encounters:  09/20/12 190/74  05/19/11 128/68  06/07/10 156/70   Gen'l- WNWD white man HEENT - right eye with lid-lag. C&S clear, PERRLA Cor- RRR, trace to 1+ DP and PT pulses, slow capillary refill. Pulm - RRR Derm - no skin breakdown feet or distal LE Neuro - Alert and oriented.       Assessment & Plan:

## 2012-09-20 NOTE — Patient Instructions (Addendum)
Blood pressure - too high. Reviewed the Nephron and the loop of Henle. Plan Continue diltiazem  Stop lisinopril/Hvt  Start lisinopril 20 mg plain, add furosemide 40 mg once a day  Return in 3 weeks for follow up  Claudication - leg fatigue with exertion. Strongly suggestive of peripheral arterial disease (PAD) Recommend - repeat Lower Extremity Arterial Dopplers with exercise to better assess the circulation.   If there is a problem identified would go the next step to a non-invasive angiogram.

## 2012-09-21 NOTE — Assessment & Plan Note (Signed)
Christopher Rubio continues to have claudication, most noticeable when working, e.g. Mowing the yard. Taking pletal once a day did not help.  Plan Restudy: LE arterial doppler with exercise.

## 2012-09-21 NOTE — Assessment & Plan Note (Signed)
BP Readings from Last 3 Encounters:  09/20/12 190/74  05/19/11 128/68  06/07/10 156/70   Patient admits to not taking medication today. He is asymptomatic  Plan Renewed Rx's for BP meds  Patient to monitor BP at home and report back. Recommendations on med changes to be based on report.

## 2012-09-24 ENCOUNTER — Telehealth: Payer: Self-pay

## 2012-09-24 NOTE — Telephone Encounter (Signed)
Ok to cancel study

## 2012-09-24 NOTE — Telephone Encounter (Signed)
Phone call from patient stating he would like to pass on having the lower extremity arterial doppler studies done. He states he's mis-read the label for Cilostazol 50 mg for the past few years and has only been taking 1 tablet daily. Now that he is taking it properly (BID) his legs are much better.

## 2012-09-25 NOTE — Telephone Encounter (Signed)
Pt notified Dr Debby Bud is okay with study being cancelled.

## 2012-10-10 ENCOUNTER — Other Ambulatory Visit: Payer: Self-pay | Admitting: Internal Medicine

## 2012-10-15 ENCOUNTER — Ambulatory Visit (INDEPENDENT_AMBULATORY_CARE_PROVIDER_SITE_OTHER): Payer: Medicare Other | Admitting: Internal Medicine

## 2012-10-15 ENCOUNTER — Encounter: Payer: Self-pay | Admitting: Internal Medicine

## 2012-10-15 ENCOUNTER — Other Ambulatory Visit (INDEPENDENT_AMBULATORY_CARE_PROVIDER_SITE_OTHER): Payer: Medicare Other

## 2012-10-15 VITALS — BP 160/78 | HR 66 | Temp 96.9°F | Resp 16 | Ht 67.0 in | Wt 157.8 lb

## 2012-10-15 DIAGNOSIS — D126 Benign neoplasm of colon, unspecified: Secondary | ICD-10-CM

## 2012-10-15 DIAGNOSIS — I739 Peripheral vascular disease, unspecified: Secondary | ICD-10-CM

## 2012-10-15 DIAGNOSIS — E785 Hyperlipidemia, unspecified: Secondary | ICD-10-CM

## 2012-10-15 DIAGNOSIS — Z0000 Encounter for general adult medical examination without abnormal findings: Secondary | ICD-10-CM | POA: Insufficient documentation

## 2012-10-15 DIAGNOSIS — I10 Essential (primary) hypertension: Secondary | ICD-10-CM

## 2012-10-15 DIAGNOSIS — M109 Gout, unspecified: Secondary | ICD-10-CM

## 2012-10-15 LAB — COMPREHENSIVE METABOLIC PANEL
AST: 19 U/L (ref 0–37)
Albumin: 4.2 g/dL (ref 3.5–5.2)
Alkaline Phosphatase: 73 U/L (ref 39–117)
Chloride: 104 mEq/L (ref 96–112)
Glucose, Bld: 109 mg/dL — ABNORMAL HIGH (ref 70–99)
Potassium: 4.4 mEq/L (ref 3.5–5.1)
Sodium: 139 mEq/L (ref 135–145)
Total Protein: 7.4 g/dL (ref 6.0–8.3)

## 2012-10-15 LAB — LIPID PANEL: Total CHOL/HDL Ratio: 7

## 2012-10-15 LAB — HEPATIC FUNCTION PANEL
ALT: 19 U/L (ref 0–53)
Total Bilirubin: 0.8 mg/dL (ref 0.3–1.2)
Total Protein: 7.4 g/dL (ref 6.0–8.3)

## 2012-10-15 LAB — URIC ACID: Uric Acid, Serum: 9.5 mg/dL — ABNORMAL HIGH (ref 4.0–7.8)

## 2012-10-15 MED ORDER — CILOSTAZOL 50 MG PO TABS: ORAL_TABLET | ORAL | Status: DC

## 2012-10-15 MED ORDER — COLCHICINE 0.6 MG PO TABS: 0.6000 mg | ORAL_TABLET | Freq: Every day | ORAL | Status: DC

## 2012-10-15 MED ORDER — ALLOPURINOL 100 MG PO TABS: 100.0000 mg | ORAL_TABLET | Freq: Every day | ORAL | Status: DC

## 2012-10-15 MED ORDER — GEMFIBROZIL 600 MG PO TABS: 600.0000 mg | ORAL_TABLET | Freq: Two times a day (BID) | ORAL | Status: DC

## 2012-10-15 NOTE — Assessment & Plan Note (Signed)
No report of recent gout flares. Uric acid is high at 9.5  Plan Uric acid lowering drugs: recommend colchicine 0.6 mg daily x 30 days to prevent gout flare with concomitant start up of allopurinol 100 mg daily.  Follow up lab 8 weeks

## 2012-10-15 NOTE — Patient Instructions (Addendum)
Thanks for coming back to see me.  For routine labs today - results will be available on MyChart within 72 hrs - please have your wife sign you up.  You seem to be doing really well.

## 2012-10-15 NOTE — Assessment & Plan Note (Signed)
Interval history is benign. Physical exam is normal. Lab with elevated LDL cholesterol, elevated uric acid, otherwise normal. Colorectal cancer screening - last record in [facility] Jan '05. Will ask him to check records with Dr. Ranae Palms. He is aged out of prostate cancer screening - [facility] '13 recommendations. Immunizations are up to date.  In summary - a very nice man who appears to be medically stable at this time. He will check his blood pressure and report back. He is asked to return Oct 7th for lab: uric acid and cholesterol levels.

## 2012-10-15 NOTE — Assessment & Plan Note (Signed)
Last colonoscopy - Jan '05 by Dr. Loreta Ave  Serrated adenomatous polyp. No correspondence in EPIC of follow up study.  PLan - notification of Dr. Loreta Ave and request for latest study.

## 2012-10-15 NOTE — Assessment & Plan Note (Signed)
Patient started on Pletal BID and he reports resolution of his claudication type symptoms  Plan Continue Pletal BID

## 2012-10-15 NOTE — Progress Notes (Signed)
Subjective:    Patient ID: Christopher Rubio., male    DOB: 18-Oct-1933, 79 y.o.   MRN: [id number]  HPI The patient is here for annual Medicare wellness examination and management of other chronic and acute problems.  He is concerned about increased urination that may be due to furosemide. He also has dry skin of the legs. He has easy bruisability.   He reports that on pletal once a day he is doing much better in regard to claudication.  He reports that when he takes his  BP at the drugstore his BP is much better controlled - running in the 130 SBP range.   The risk factors are reflected in the social history.  The roster of all physicians providing medical care to patient - is listed in the Snapshot section of the chart.  Activities of daily living:  The patient is 100% inedpendent in all ADLs: dressing, toileting, feeding as well as independent mobility  Home safety : The patient has smoke detectors in the home. No falls in the last 6 months.They wear seatbelts most of the time. No firearms at home  There is no violence in the home.   There is no risks for hepatitis, STDs or HIV. There is no  history of blood transfusion. They have no travel history to infectious disease endemic areas of the world.  The patient has seen their dentist in the last six month. They have seen their eye doctor in the last year. They admit to hearing difficulty and have not had audiologic testing in the last year.    They do not  have excessive sun exposure. Discussed the need for sun protection: hats, long sleeves and use of sunscreen if there is significant sun exposure.   Diet: the importance of a healthy diet is discussed. They do have a healthy diet.  The patient has a regular exercise program: golf, walking , 30-60 duration, 5 per week.  The benefits of regular aerobic exercise were discussed.  Depression screen: there are no signs or vegative symptoms of depression- irritability, change in  appetite, anhedonia, sadness/tearfullness.  Cognitive assessment: the patient manages all their financial and personal affairs and is actively engaged.   The following portions of the patient's history were reviewed and updated as appropriate: allergies, current medications, past family history, past medical history,  past surgical history, past social history  and problem list.  Vision, hearing, body mass index were assessed and reviewed.   During the course of the visit the patient was educated and counseled about appropriate screening and preventive services including : fall prevention , diabetes screening, nutrition counseling, colorectal cancer screening, and recommended immunizations.  Past Medical History  Diagnosis Date  . Impotence of organic origin   . Contact dermatitis and other eczema, due to unspecified cause   . Benign neoplasm of colon   . Gout, unspecified   . Other and unspecified hyperlipidemia   . Unspecified essential hypertension   . Diverticulosis of colon (without mention of hemorrhage)   . Basal cell carcinoma of skin, site unspecified   . Extrinsic asthma, unspecified    Past Surgical History  Procedure Laterality Date  . Tonsillectomy     Family History  Problem Relation Age of Onset  . Coronary artery disease Father 32    deceased  . Heart attack Father   . Stroke Mother   . Hypertension Mother   . Diabetes Neg Hx   . Cancer Neg Hx  colon, prostate   History   Social History  . Marital Status: Married    Spouse Name: N/A    Number of Children: 2  . Years of Education: N/A   Occupational History  . retired Runner, broadcasting/film/video    Social History Main Topics  . Smoking status: Current Every Day Smoker  . Smokeless tobacco: Not on file  . Alcohol Use: No  . Drug Use: No  . Sexually Active: Not on file   Other Topics Concern  . Not on file   Social History Narrative   HSG, [employer], [employer]; Masters [employer]. Married '62. 2  daughters - '67, '70: no grandchildren. Retired-college Editor, commissioning. Enjoys retirement: golf, gardening, woodworking. Marriage in good health             Current Outpatient Prescriptions on File Prior to Visit  Medication Sig Dispense Refill  . aspirin 81 MG tablet Take 81 mg by mouth daily.      . Cholecalciferol (VITAMIN D PO) Take by mouth daily.      . fish oil-omega-3 fatty acids 1000 MG capsule Take 2 g by mouth daily.      . furosemide (LASIX) 40 MG tablet Take 1 tablet (40 mg total) by mouth daily.  30 tablet  3  . lisinopril (PRINIVIL,ZESTRIL) 20 MG tablet Take 1 tablet (20 mg total) by mouth daily.  90 tablet  3   No current facility-administered medications on file prior to visit.     Review of Systems Constitutional:  Negative for fever, chills, activity change and unexpected weight change.  HEENT:  Negative for hearing loss, ear pain, congestion, neck stiffness and postnasal drip. Negative for sore throat or swallowing problems. Negative for dental complaints.   Eyes: Negative for vision loss or change in visual acuity.  Respiratory: Negative for chest tightness and wheezing. Negative for DOE.   Cardiovascular: Negative for chest pain or palpitations. No decreased exercise tolerance Gastrointestinal: No change in bowel habit. No bloating or gas. No reflux or indigestion Genitourinary: Negative for urgency, frequency, flank pain and difficulty urinating.  Musculoskeletal: Negative for myalgias, back pain, arthralgias and gait problem.  Neurological: Negative for dizziness, tremors, weakness and headaches.  Hematological: Negative for adenopathy.  Psychiatric/Behavioral: Negative for behavioral problems and dysphoric mood.       Objective:   Physical Exam Filed Vitals:   10/15/12 1023  BP: 160/78  Pulse: 66  Temp: 96.9 F (36.1 C)  Resp: 16   Wt Readings from Last 3 Encounters:  10/15/12 157 lb 12.8 oz (71.578 kg)  09/20/12 159 lb 6.4 oz (72.303 kg)  05/19/11  161 lb 8 oz (73.256 kg)   Gen'l: Well nourished well developed     male in no acute distress  HEENT: Head: Normocephalic and atraumatic. Right Ear: External ear normal. EAC/TM nl. Left Ear: External ear normal.  EAC/TM nl. Nose: Nose normal. Mouth/Throat: Oropharynx is clear and moist. Dentition - native, in good repair. No buccal or palatal lesions. Posterior pharynx clear. Eyes: Conjunctivae and sclera clear. EOM intact. Pupils are equal, round, and reactive to light. Right eye exhibits no discharge. Left eye exhibits no discharge. Neck: Normal range of motion. Neck supple. No JVD present. No tracheal deviation present. No thyromegaly present.  Cardiovascular: Normal rate, regular rhythm, no gallop, no friction rub, no murmur heard.      Quiet precordium. 2+ radial and DP pulses . No carotid bruits Pulmonary/Chest: Effort normal. No respiratory distress or increased WOB, no wheezes,  no rales. No chest wall deformity or CVAT. Abdomen: Soft. Bowel sounds are normal in all quadrants. He exhibits no distension, no tenderness, no rebound or guarding, No heptosplenomegaly  Genitourinary: deferred   Musculoskeletal: Normal range of motion. He exhibits no edema and no tenderness.       Small and large joints without redness, synovial thickening or deformity. Full range of motion preserved about all small, median and large joints.  Lymphadenopathy:    He has no cervical or supraclavicular adenopathy.  Neurological: He is alert and oriented to person, place, and time. CN II-XII intact. DTRs 2+ and symmetrical biceps, radial and patellar tendons. Cerebellar function normal with no tremor, rigidity, normal gait and station.  Skin: Skin is warm and dry. No rash noted. No erythema.  Psychiatric: He has a normal mood and affect. His behavior is normal. Thought content normal.    Recent Results (from the past 2160 hour(s))  HEPATIC FUNCTION PANEL     Status: None   Collection Time    10/15/12 11:49 AM       Result Value Range   Total Bilirubin 0.8  0.3 - 1.2 mg/dL   Bilirubin, Direct 0.1  0.0 - 0.3 mg/dL   Alkaline Phosphatase 73  39 - 117 U/L   AST 19  0 - 37 U/L   ALT 19  0 - 53 U/L   Total Protein 7.4  6.0 - 8.3 g/dL   Albumin 4.2  3.5 - 5.2 g/dL  COMPREHENSIVE METABOLIC PANEL     Status: Abnormal   Collection Time    10/15/12 11:49 AM      Result Value Range   Sodium 139  135 - 145 mEq/L   Potassium 4.4  3.5 - 5.1 mEq/L   Chloride 104  96 - 112 mEq/L   CO2 24  19 - 32 mEq/L   Glucose, Bld 109 (*) 70 - 99 mg/dL   BUN 28 (*) 6 - 23 mg/dL   Creatinine, Ser 1.5  0.4 - 1.5 mg/dL   Total Bilirubin 0.8  0.3 - 1.2 mg/dL   Alkaline Phosphatase 73  39 - 117 U/L   AST 19  0 - 37 U/L   ALT 19  0 - 53 U/L   Total Protein 7.4  6.0 - 8.3 g/dL   Albumin 4.2  3.5 - 5.2 g/dL   Calcium 9.4  8.4 - 40.9 mg/dL   GFR 81.19 (*) >14.78 mL/min  LIPID PANEL     Status: Abnormal   Collection Time    10/15/12 11:49 AM      Result Value Range   Cholesterol 242 (*) 0 - 200 mg/dL   Comment: ATP III Classification       Desirable:  < 200 mg/dL               Borderline High:  200 - 239 mg/dL          High:  > = 295 mg/dL   Triglycerides 621.3 (*) 0.0 - 149.0 mg/dL   Comment: Normal:  <086 mg/dLBorderline High:  150 - 199 mg/dL   HDL 57.84 (*) >69.62 mg/dL   VLDL 95.2 (*) 0.0 - 84.1 mg/dL   Total CHOL/HDL Ratio 7     Comment:                Men          Women1/2 Average Risk     3.4  3.3Average Risk          5.0          4.42X Average Risk          9.6          7.13X Average Risk          15.0          11.0                      URIC ACID     Status: Abnormal   Collection Time    10/15/12 11:49 AM      Result Value Range   Uric Acid, Serum 9.5 (*) 4.0 - 7.8 mg/dL  LDL CHOLESTEROL, DIRECT     Status: None   Collection Time    10/15/12 11:49 AM      Result Value Range   Direct LDL 157.8     Comment: Optimal:  <100 mg/dLNear or Above Optimal:  100-129 mg/dLBorderline High:  130-159 mg/dLHigh:   160-189 mg/dLVery High:  >190 mg/dL        Assessment & Plan:

## 2012-10-15 NOTE — Assessment & Plan Note (Signed)
BP Readings from Last 3 Encounters:  10/15/12 160/78  09/20/12 190/74  05/19/11 128/68   Plan  continue present medications  Record of at home or drugstore readings for the record.

## 2012-10-15 NOTE — Assessment & Plan Note (Signed)
LDL 157.8 above goal of 130 or less. At treatment threshold of 160+.  Plan Continue exercise and low fat diet  Trial of lopid 600 mg: take 1/2 tab daily x 3, 1/2 tab AM/PM x 3, 1 tab AM 1/2 tab PM x 3 then 1 tab bid.  Follow up lab after on full dose for 4 weeks or after

## 2012-11-09 ENCOUNTER — Encounter: Payer: Self-pay | Admitting: Internal Medicine

## 2012-11-14 ENCOUNTER — Other Ambulatory Visit: Payer: Self-pay

## 2012-12-09 ENCOUNTER — Other Ambulatory Visit: Payer: Self-pay | Admitting: Internal Medicine

## 2013-02-14 ENCOUNTER — Other Ambulatory Visit: Payer: Self-pay

## 2013-03-31 ENCOUNTER — Emergency Department (HOSPITAL_COMMUNITY)
Admission: EM | Admit: 2013-03-31 | Discharge: 2013-03-31 | Disposition: A | Discharge status: Home / Self Care | Payer: Medicare Other | Attending: Emergency Medicine | Admitting: Emergency Medicine

## 2013-03-31 ENCOUNTER — Encounter (HOSPITAL_COMMUNITY): Payer: Self-pay | Admitting: Emergency Medicine

## 2013-03-31 DIAGNOSIS — F17200 Nicotine dependence, unspecified, uncomplicated: Secondary | ICD-10-CM | POA: Insufficient documentation

## 2013-03-31 DIAGNOSIS — I951 Orthostatic hypotension: Secondary | ICD-10-CM | POA: Insufficient documentation

## 2013-03-31 DIAGNOSIS — R748 Abnormal levels of other serum enzymes: Secondary | ICD-10-CM | POA: Insufficient documentation

## 2013-03-31 DIAGNOSIS — Z87448 Personal history of other diseases of urinary system: Secondary | ICD-10-CM | POA: Insufficient documentation

## 2013-03-31 DIAGNOSIS — R7989 Other specified abnormal findings of blood chemistry: Secondary | ICD-10-CM

## 2013-03-31 DIAGNOSIS — Z8719 Personal history of other diseases of the digestive system: Secondary | ICD-10-CM | POA: Insufficient documentation

## 2013-03-31 DIAGNOSIS — Z85828 Personal history of other malignant neoplasm of skin: Secondary | ICD-10-CM | POA: Insufficient documentation

## 2013-03-31 DIAGNOSIS — R112 Nausea with vomiting, unspecified: Secondary | ICD-10-CM | POA: Insufficient documentation

## 2013-03-31 DIAGNOSIS — R5381 Other malaise: Secondary | ICD-10-CM | POA: Insufficient documentation

## 2013-03-31 DIAGNOSIS — E860 Dehydration: Secondary | ICD-10-CM | POA: Insufficient documentation

## 2013-03-31 DIAGNOSIS — M109 Gout, unspecified: Secondary | ICD-10-CM | POA: Insufficient documentation

## 2013-03-31 DIAGNOSIS — Z7982 Long term (current) use of aspirin: Secondary | ICD-10-CM | POA: Insufficient documentation

## 2013-03-31 DIAGNOSIS — R42 Dizziness and giddiness: Secondary | ICD-10-CM | POA: Insufficient documentation

## 2013-03-31 DIAGNOSIS — R55 Syncope and collapse: Secondary | ICD-10-CM | POA: Insufficient documentation

## 2013-03-31 DIAGNOSIS — E872 Acidosis, unspecified: Secondary | ICD-10-CM | POA: Insufficient documentation

## 2013-03-31 DIAGNOSIS — Z79899 Other long term (current) drug therapy: Secondary | ICD-10-CM | POA: Insufficient documentation

## 2013-03-31 DIAGNOSIS — I10 Essential (primary) hypertension: Secondary | ICD-10-CM | POA: Insufficient documentation

## 2013-03-31 DIAGNOSIS — Z872 Personal history of diseases of the skin and subcutaneous tissue: Secondary | ICD-10-CM | POA: Insufficient documentation

## 2013-03-31 DIAGNOSIS — J45909 Unspecified asthma, uncomplicated: Secondary | ICD-10-CM | POA: Insufficient documentation

## 2013-03-31 LAB — URINE MICROSCOPIC-ADD ON

## 2013-03-31 LAB — POCT I-STAT, CHEM 8
BUN: 34 mg/dL — ABNORMAL HIGH (ref 6–23)
Calcium, Ion: 1.16 mmol/L (ref 1.13–1.30)
Creatinine, Ser: 1.8 mg/dL — ABNORMAL HIGH (ref 0.50–1.35)
Glucose, Bld: 113 mg/dL — ABNORMAL HIGH (ref 70–99)
HCT: 34 % — ABNORMAL LOW (ref 39.0–52.0)
Hemoglobin: 11.6 g/dL — ABNORMAL LOW (ref 13.0–17.0)
Potassium: 4 mEq/L (ref 3.5–5.1)
Sodium: 144 mEq/L (ref 135–145)
TCO2: 25 mmol/L (ref 0–100)

## 2013-03-31 LAB — CBC WITH DIFFERENTIAL/PLATELET
Basophils Relative: 1 % (ref 0–1)
Hemoglobin: 13.7 g/dL (ref 13.0–17.0)
Lymphocytes Relative: 26 % (ref 12–46)
Lymphs Abs: 2.4 10*3/uL (ref 0.7–4.0)
Monocytes Relative: 9 % (ref 3–12)
Neutro Abs: 5.8 10*3/uL (ref 1.7–7.7)
Neutrophils Relative %: 61 % (ref 43–77)
Platelets: 182 10*3/uL (ref 150–400)
RBC: 4.32 MIL/uL (ref 4.22–5.81)

## 2013-03-31 LAB — URINALYSIS, ROUTINE W REFLEX MICROSCOPIC
Bilirubin Urine: NEGATIVE
Ketones, ur: NEGATIVE mg/dL
Nitrite: NEGATIVE
Specific Gravity, Urine: 1.016 (ref 1.005–1.030)
Urobilinogen, UA: 0.2 mg/dL (ref 0.0–1.0)
pH: 5.5 (ref 5.0–8.0)

## 2013-03-31 LAB — COMPREHENSIVE METABOLIC PANEL
BUN: 39 mg/dL — ABNORMAL HIGH (ref 6–23)
CO2: 26 mEq/L (ref 19–32)
Calcium: 9 mg/dL (ref 8.4–10.5)
Chloride: 101 mEq/L (ref 96–112)
Creatinine, Ser: 1.9 mg/dL — ABNORMAL HIGH (ref 0.50–1.35)
GFR calc Af Amer: 38 mL/min — ABNORMAL LOW (ref >90)
GFR calc non Af Amer: 32 mL/min — ABNORMAL LOW (ref >90)
Glucose, Bld: 107 mg/dL — ABNORMAL HIGH (ref 70–99)
Potassium: 4 mEq/L (ref 3.5–5.1)
Total Bilirubin: 0.3 mg/dL (ref 0.3–1.2)

## 2013-03-31 LAB — POCT I-STAT TROPONIN I: Troponin i, poc: 0.01 ng/mL (ref 0.00–0.08)

## 2013-03-31 LAB — GLUCOSE, CAPILLARY: Glucose-Capillary: 92 mg/dL (ref 70–99)

## 2013-03-31 LAB — CG4 I-STAT (LACTIC ACID)
Lactic Acid, Venous: 3.06 mmol/L — ABNORMAL HIGH (ref 0.5–2.2)
Lactic Acid, Venous: 0.67 mmol/L (ref 0.5–2.2)

## 2013-03-31 MED ORDER — SODIUM CHLORIDE 0.9 % IV BOLUS (SEPSIS)
1000.0000 mL | Freq: Once | INTRAVENOUS | Status: AC
  Administered 2013-03-31: 1000 mL via INTRAVENOUS

## 2013-03-31 NOTE — ED Notes (Signed)
i stat lactic acid shown to Dr Allen and Monica, RN 

## 2013-03-31 NOTE — ED Notes (Signed)
Was at a Christmas party c/o sudden onset dizziness & had a syncopal episode lasting approx 3 min. Emesis x 8 per EMS.  EMS reports initial BP 80/40, given zofran 4mg  IV. Pt A&OX4 upon arrival to ED

## 2013-03-31 NOTE — ED Provider Notes (Signed)
I saw and evaluated the patient, reviewed the resident's note and I agree with the findings and plan.  EKG Interpretation    Date/Time:  Sunday March 31 2013 14:15:28 EST Ventricular Rate:  62 PR Interval:  134 QRS Duration: 84 QT Interval:  422 QTC Calculation: 428 R Axis:   46 Text Interpretation:  Sinus rhythm Ventricular premature complex Baseline wander in lead(s) III aVL No significant change since last tracing Confirmed by Gem Conkle  MD, Andi Layfield ([id number]) on 03/31/2013 3:32:56 PM           Pt seen and examined--pt consumed EtOH on an empty stomach. A syncopal event. The patient denied any chest pain shortness of breath prior to the event. Was on hypotensive. This is likely from volume depletion. Patient be given fluid resuscitation and be evaluated. Patient likely had a vasovagal episode and will likely be discharged  Toy Baker, MD 03/31/13 1544

## 2013-03-31 NOTE — ED Notes (Signed)
Pt undressed, in gown, on monitor, continuous pulse oximetry and blood pressure cuff; friends at bedside; warm blankets placed on patient and around pt's head

## 2013-03-31 NOTE — ED Provider Notes (Signed)
CSN: [id number]     Arrival date & time 03/31/13  1412 History   First MD Initiated Contact with Patient 03/31/13 1501     Chief Complaint  Patient presents with  . Loss of Consciousness  . Emesis  . Dizziness   HPI  79 y/o Rubio with history of HTN who presents via EMS for syncope. The patient was in his usual state of health prior to his episode. He was at a [employer] and drank 3/4 of a bloody mary on an empty stomach. He was standing up and talking when he began to feel dizzy. He states that he then sat down. After sitting, his friends state that he lost consciousness for approximately 1 minute. He was slouched over in a chair and vomited. He became alert again at which point he felt nauseated and had generalized weakness. EMS was called. He had several episodes of NBNB vomiting with EMS. He was hypotensive in route with BP of 80/40. He was given Zofran and transferred here for further evaluation.   Past Medical History  Diagnosis Date  . Impotence of organic origin   . Contact dermatitis and other eczema, due to unspecified cause   . Benign neoplasm of colon   . Gout, unspecified   . Other and unspecified hyperlipidemia   . Unspecified essential hypertension   . Diverticulosis of colon (without mention of hemorrhage)   . Basal cell carcinoma of skin, site unspecified   . Extrinsic asthma, unspecified    Past Surgical History  Procedure Laterality Date  . Tonsillectomy     Family History  Problem Relation Age of Onset  . Coronary artery disease Father 10    deceased  . Heart attack Father   . Stroke Mother   . Hypertension Mother   . Diabetes Neg Hx   . Cancer Neg Hx     colon, prostate   History  Substance Use Topics  . Smoking status: Current Every Day Smoker  . Smokeless tobacco: Not on file  . Alcohol Use: Yes    Review of Systems  Constitutional: Positive for fatigue. Negative for fever and chills.  Respiratory: Negative for cough and shortness of breath.    Cardiovascular: Negative for chest pain.  Gastrointestinal: Positive for nausea and vomiting. Negative for abdominal pain.  Neurological: Positive for syncope. Negative for weakness, numbness and headaches.  All other systems reviewed and are negative.    Allergies  Codeine; Neomycin-bacitracin zn-polymyx; and Prednisone  Home Medications   Current Outpatient Rx  Name  Route  Sig  Dispense  Refill  . aspirin EC 81 MG tablet   Oral   Take 81 mg by mouth at bedtime.         . Cholecalciferol (VITAMIN D) 2000 UNITS CAPS   Oral   Take 2,000 Units by mouth daily.         . cilostazol (PLETAL) 50 MG tablet   Oral   Take 50 mg by mouth daily.         . colchicine 0.6 MG tablet   Oral   Take 0.6 mg by mouth daily as needed (gout).         . fish oil-omega-3 fatty acids 1000 MG capsule   Oral   Take 1 g by mouth daily.          . furosemide (LASIX) 40 MG tablet   Oral   Take 40 mg by mouth daily.         Marland Kitchen  ibuprofen (ADVIL,MOTRIN) 200 MG tablet   Oral   Take 600 mg by mouth 3 (three) times daily as needed (pain).         Marland Kitchen lisinopril (PRINIVIL,ZESTRIL) 20 MG tablet   Oral   Take 1 tablet (20 mg total) by mouth daily.   90 tablet   3    BP 140/76  Pulse 60  Temp(Src) 98 F (36.7 C) (Oral)  Resp 16  Ht 5\' 7"  (1.702 m)  Wt 158 lb (71.668 kg)  BMI 24.74 kg/m2  SpO2 97% Physical Exam  Constitutional: He is oriented to person, place, and time. He appears well-developed and well-nourished. No distress.  HENT:  Head: Normocephalic and atraumatic.  Mouth/Throat: No oropharyngeal exudate.  Eyes: Conjunctivae are normal. Pupils are equal, round, and reactive to light.  Neck: Normal range of motion. Neck supple.  Cardiovascular: Normal rate and normal heart sounds.  Exam reveals no gallop and no friction rub.   No murmur heard. Pulmonary/Chest: Effort normal and breath sounds normal.  Abdominal: Soft. He exhibits no distension. There is no tenderness.   Musculoskeletal: Normal range of motion. He exhibits no edema and no tenderness.  Neurological: He is alert and oriented to person, place, and time. He has normal strength and normal reflexes. No cranial nerve deficit or sensory deficit. Coordination normal. GCS eye subscore is 4. GCS verbal subscore is 5. GCS motor subscore is 6.  Normal finger to nose. Normal alternating hand movements. No pronator drift.   Skin: Skin is warm and dry.    ED Course  Procedures (including critical care time) Labs Review Labs Reviewed  COMPREHENSIVE METABOLIC PANEL - Abnormal; Notable for the following:    Glucose, Bld 107 (*)    BUN 39 (*)    Creatinine, Ser 1.90 (*)    GFR calc non Af Amer 32 (*)    GFR calc Af Amer 38 (*)    All other components within normal limits  CBC WITH DIFFERENTIAL - Abnormal; Notable for the following:    HCT 38.7 (*)    All other components within normal limits  URINALYSIS, ROUTINE W REFLEX MICROSCOPIC - Abnormal; Notable for the following:    Leukocytes, UA SMALL (*)    All other components within normal limits  URINE MICROSCOPIC-ADD ON - Abnormal; Notable for the following:    Casts HYALINE CASTS (*)    All other components within normal limits  CG4 I-STAT (LACTIC ACID) - Abnormal; Notable for the following:    Lactic Acid, Venous 3.06 (*)    All other components within normal limits  POCT I-STAT, CHEM 8 - Abnormal; Notable for the following:    BUN 34 (*)    Creatinine, Ser 1.80 (*)    Glucose, Bld 113 (*)    Hemoglobin 11.6 (*)    HCT 34.0 (*)    All other components within normal limits  URINE CULTURE  GLUCOSE, CAPILLARY  POCT I-STAT TROPONIN I  CG4 I-STAT (LACTIC ACID)   Imaging Review No results found.  EKG Interpretation    Date/Time:  Sunday March 31 2013 14:15:28 EST Ventricular Rate:  Christopher PR Interval:  134 QRS Duration: 84 QT Interval:  422 QTC Calculation: 428 R Axis:   46 Text Interpretation:  Sinus rhythm Ventricular premature complex  Baseline wander in lead(s) III aVL No significant change since last tracing Confirmed by Freida Busman  MD, ANTHONY ([id number]) on 03/31/2013 3:32:56 PM            Date: 03/31/2013  Rate: Christopher  Rhythm: normal sinus rhythm  QRS Axis: normal  Intervals: normal  ST/T Wave abnormalities: normal  Conduction Disutrbances:none  Narrative Interpretation:   Old EKG Reviewed: unchanged  MDM   79 y/o Rubio with history of HTN who presents via EMS after syncopal episode. No CP or SOB. EKG without acute ischemic changes. Troponin negative. BMP with mild elevated in BUN and Cr. Improved with IVF. Lactate cleared. UA not c/w UTI. At time of discharge the patient was asymptomatic and ambulated without difficulty. Symptoms likely secondary from dehydration from over diuresis, low po intake and alcohol use. Instructed the patient to hold his lasix until he follows up with his pcp. He was instructed to call tomorrow to make an appointment with his primary care physician for a recheck of his renal function. The patient was in agreement with the plan.    1. Syncope   2. Elevated serum creatinine   3. Orthostatic hypotension   4. Dehydration   5. Lactic acidosis        Shanon Ace, MD 04/01/13 [phone]

## 2013-03-31 NOTE — ED Notes (Signed)
PT ambulates well. O2 consistent at 97%

## 2013-04-02 NOTE — ED Provider Notes (Signed)
I saw and evaluated the patient, reviewed the resident's note and I agree with the findings and plan.  EKG Interpretation    Date/Time:  Sunday March 31 2013 20:44:12 EST Ventricular Rate:  78 PR Interval:  132 QRS Duration: 88 QT Interval:  385 QTC Calculation: 438 R Axis:   -49 Text Interpretation:  Sinus rhythm Left anterior fascicular block Abnormal R-wave progression, late transition Confirmed by Marilea Gwynne  MD, Ivis Henneman ([id number]) on 03/31/2013 10:52:06 PM             Galina Haddox T Ayman Brull, MD 04/02/13 2005 

## 2013-04-03 ENCOUNTER — Other Ambulatory Visit: Payer: Medicare Other

## 2013-04-03 ENCOUNTER — Encounter: Payer: Self-pay | Admitting: Internal Medicine

## 2013-04-03 ENCOUNTER — Ambulatory Visit (INDEPENDENT_AMBULATORY_CARE_PROVIDER_SITE_OTHER): Payer: Medicare Other | Admitting: Internal Medicine

## 2013-04-03 VITALS — BP 180/88 | HR 67 | Temp 97.2°F | Wt 159.6 lb

## 2013-04-03 DIAGNOSIS — R55 Syncope and collapse: Secondary | ICD-10-CM

## 2013-04-03 DIAGNOSIS — R7989 Other specified abnormal findings of blood chemistry: Secondary | ICD-10-CM

## 2013-04-03 DIAGNOSIS — R799 Abnormal finding of blood chemistry, unspecified: Secondary | ICD-10-CM

## 2013-04-03 LAB — BASIC METABOLIC PANEL
BUN: 19 mg/dL (ref 6–23)
CO2: 27 mEq/L (ref 19–32)
Calcium: 8.9 mg/dL (ref 8.4–10.5)
Chloride: 106 mEq/L (ref 96–112)
Creatinine, Ser: 1.1 mg/dL (ref 0.4–1.5)
GFR: 68.24 mL/min (ref >60.00)
Potassium: 4.2 mEq/L (ref 3.5–5.1)

## 2013-04-03 LAB — URINE CULTURE: Colony Count: 70000

## 2013-04-03 NOTE — Progress Notes (Signed)
Subjective:    Patient ID: Joan Flores., male    DOB: [birth year], 79 y.o.   MRN: [id number]  HPI Mr. Huntsberry had a vaso-vagal episode on the 22nd: he was at a xmas party, had 3/4 a strong blood mary some of the buffet then had the on-set of mild tingling in the head, glare bothered his eyes, felt light-headed and sat down follow by a loss of consciousness for about 1 minute. When he awoke he was awake and had the on-set of N/V multiple times. He was transported to [facility] ED - all notes and labs reviewed. His EKG and lab work were normal except for elevated Cr @ 1.80. He was given IV fluids, his symptoms did abate and he was discharged home with instructions to hold the furosemide. He has been well since.   Past Medical History  Diagnosis Date  . Impotence of organic origin   . Contact dermatitis and other eczema, due to unspecified cause   . Benign neoplasm of colon   . Gout, unspecified   . Other and unspecified hyperlipidemia   . Unspecified essential hypertension   . Diverticulosis of colon (without mention of hemorrhage)   . Basal cell carcinoma of skin, site unspecified   . Extrinsic asthma, unspecified    Past Surgical History  Procedure Laterality Date  . Tonsillectomy     Family History  Problem Relation Age of Onset  . Coronary artery disease Father 29    deceased  . Heart attack Father   . Stroke Mother   . Hypertension Mother   . Diabetes Neg Hx   . Cancer Neg Hx     colon, prostate   History   Social History  . Marital Status: Married    Spouse Name: N/A    Number of Children: 2  . Years of Education: N/A   Occupational History  . retired Runner, broadcasting/film/video    Social History Main Topics  . Smoking status: Current Every Day Smoker  . Smokeless tobacco: Not on file  . Alcohol Use: Yes  . Drug Use: No  . Sexual Activity: Not on file   Other Topics Concern  . Not on file   Social History Narrative   HSG, [employer], [employer]; Masters  [employer]. Married '62. 2 daughters - '67, '70: no grandchildren. Retired-college Editor, commissioning. Enjoys retirement: golf, gardening, woodworking. Marriage in good health             Current Outpatient Prescriptions on File Prior to Visit  Medication Sig Dispense Refill  . aspirin EC 81 MG tablet Take 81 mg by mouth at bedtime.      . Cholecalciferol (VITAMIN D) 2000 UNITS CAPS Take 2,000 Units by mouth daily.      . cilostazol (PLETAL) 50 MG tablet Take 50 mg by mouth daily.      . colchicine 0.6 MG tablet Take 0.6 mg by mouth daily as needed (gout).      . fish oil-omega-3 fatty acids 1000 MG capsule Take 1 g by mouth daily.       . furosemide (LASIX) 40 MG tablet Take 40 mg by mouth daily.      Marland Kitchen ibuprofen (ADVIL,MOTRIN) 200 MG tablet Take 600 mg by mouth 3 (three) times daily as needed (pain).      Marland Kitchen lisinopril (PRINIVIL,ZESTRIL) 20 MG tablet Take 1 tablet (20 mg total) by mouth daily.  90 tablet  3   No current facility-administered medications on file prior  to visit.       Review of Systems System review is negative for any constitutional, cardiac, pulmonary, GI or neuro symptoms or complaints other than as described in the HPI.     Objective:   Physical Exam Filed Vitals:   04/03/13 1113  BP: 180/88  Pulse: 67  Temp: 97.2 F (36.2 C)   Gen'l - WNWD man HEENT- nl Cor 2+ Radial pulse, RRR, II/VI mm RSB Pulm - normal Neuro - A&O x 3, CN II-XII, speech clear, cognition normal, Cerebellar- no tremor, normal gait.   BMET    Component Value Date/Time   NA 139 04/03/2013 1156   K 4.2 04/03/2013 1156   CL 106 04/03/2013 1156   CO2 27 04/03/2013 1156   GLUCOSE 94 04/03/2013 1156   BUN 19 04/03/2013 1156   CREATININE 1.1 04/03/2013 1156   CALCIUM 8.9 04/03/2013 1156   GFRNONAA 32* 03/31/2013 1502   GFRAA 38* 03/31/2013 1502         Assessment & Plan:  1. You had what was most likely a vaso-vagal episode related to multiple factors including possible food  reaction or a rapid acting stomach virus. With such a rapid recovery I favor the former.  The lab and EKG from the ED were normal. Your exam today is normal. No further testing is needed.   Please resume taking the furosemide  2. Renal function - acute renal insufficiency. Lab reveals renal function returned to normal baseline.

## 2013-04-03 NOTE — Patient Instructions (Signed)
Happy Holidays  You had what was most likely a vaso-vagal episode related to multiple factors including possible food reaction or a rapid acting stomach virus. With such a rapid recovery I favor the former.  The lab and EKG from the ED were normal. Your exam today is normal. No further testing is needed.   Please resume taking the furosemide  Vasovagal Syncope, Adult Syncope, commonly known as fainting, is a temporary loss of consciousness. It occurs when the blood flow to the brain is reduced. Vasovagal syncope (also called neurocardiogenic syncope) is a fainting spell in which the blood flow to the brain is reduced because of a sudden drop in heart rate and blood pressure. Vasovagal syncope occurs when the brain and the cardiovascular system (blood vessels) do not adequately communicate and respond to each other. This is the most common cause of fainting. It often occurs in response to fear or some other type of emotional or physical stress. The body has a reaction in which the heart starts beating too slowly or the blood vessels expand, reducing blood pressure. This type of fainting spell is generally considered harmless. However, injuries can occur if a person takes a sudden fall during a fainting spell.  CAUSES  Vasovagal syncope occurs when a person's blood pressure and heart rate decrease suddenly, usually in response to a trigger. Many things and situations can trigger an episode. Some of these include:   Pain.   Fear.   The sight of blood or medical procedures, such as blood being drawn from a vein.   Common activities, such as coughing, swallowing, stretching, or going to the bathroom.   Emotional stress.   Prolonged standing, especially in a warm environment.   Lack of sleep or rest.   Prolonged lack of food.   Prolonged lack of fluids.   Recent illness.  The use of certain drugs that affect blood pressure, such as cocaine, alcohol, marijuana, inhalants, and  opiates.  SYMPTOMS  Before the fainting episode, you may:   Feel dizzy or light headed.   Become pale.  Sense that you are going to faint.   Feel like the room is spinning.   Have tunnel vision, only seeing directly in front of you.   Feel sick to your stomach (nauseous).   See spots or slowly lose vision.   Hear ringing in your ears.   Have a headache.   Feel warm and sweaty.   Feel a sensation of pins and needles. During the fainting spell, you will generally be unconscious for no longer than a couple minutes before waking up and returning to normal. If you get up too quickly before your body can recover, you may faint again. Some twitching or jerky movements may occur during the fainting spell.  DIAGNOSIS  Your caregiver will ask about your symptoms, take a medical history, and perform a physical exam. Various tests may be done to rule out other causes of fainting. These may include blood tests and tests to check the heart, such as electrocardiography, echocardiography, and possibly an electrophysiology study. When other causes have been ruled out, a test may be done to check the body's response to changes in position (tilt table test). TREATMENT  Most cases of vasovagal syncope do not require treatment. Your caregiver may recommend ways to avoid fainting triggers and may provide home strategies for preventing fainting. If you must be exposed to a possible trigger, you can drink additional fluids to help reduce your chances of having an  episode of vasovagal syncope. If you have warning signs of an oncoming episode, you can respond by positioning yourself favorably (lying down). If your fainting spells continue, you may be given medicines to prevent fainting. Some medicines may help make you more resistant to repeated episodes of vasovagal syncope. Special exercises or compression stockings may be recommended. In rare cases, the surgical placement of a pacemaker is  considered. HOME CARE INSTRUCTIONS   Learn to identify the warning signs of vasovagal syncope.   Sit or lie down at the first warning sign of a fainting spell. If sitting, put your head down between your legs. If you lie down, swing your legs up in the air to increase blood flow to the brain.   Avoid hot tubs and saunas.  Avoid prolonged standing.  Drink enough fluids to keep your urine clear or pale yellow. Avoid caffeine.  Increase salt in your diet as directed by your caregiver.   If you have to stand for a long time, perform movements such as:   Crossing your legs.   Flexing and stretching your leg muscles.   Squatting.   Moving your legs.   Bending over.   Only take over-the-counter or prescription medicines as directed by your caregiver. Do not suddenly stop any medicines without asking your caregiver first. SEEK MEDICAL CARE IF:   Your fainting spells continue or happen more frequently in spite of treatment.   You lose consciousness for more than a couple minutes.  You have fainting spells during or after exercising or after being startled.   You have new symptoms that occur with the fainting spells, such as:   Shortness of breath.  Chest pain.   Irregular heartbeat.   You have episodes of twitching or jerky movements that last longer than a few seconds.  You have episodes of twitching or jerky movements without obvious fainting. SEEK IMMEDIATE MEDICAL CARE IF:   You have injuries or bleeding after a fainting spell.   You have episodes of twitching or jerky movements that last longer than 5 minutes.   You have more than one spell of twitching or jerky movements before returning to consciousness after fainting. MAKE SURE YOU:   Understand these instructions.  Will watch your condition.  Will get help right away if you are not doing well or get worse. Document Released: 03/14/2012 Document Reviewed: 03/14/2012 [facility] Patient  Information 2014 [place], [place].

## 2013-04-03 NOTE — Progress Notes (Signed)
Pre visit review using our clinic review tool, if applicable. No additional management support is needed unless otherwise documented below in the visit note. 

## 2013-04-04 NOTE — ED Notes (Signed)
Post ED Visit - Positive Culture Follow-up: Successful Patient Follow-Up  Culture assessed and recommendations reviewed by:  [X]  Patient discharged originally without antimicrobial agent and treatment is now indicated  New antibiotic prescription: Cephalexin 500mg  po 4 times daily for 7 days  ED Provider: Johnnette Gourd, PA-C  Christopher Rubio  04/04/2013, 9:58 AM  Infectious Diseases Pharmacist   Larena Sox 04/04/2013, 5:50 PM

## 2013-04-04 NOTE — Progress Notes (Signed)
ED Antimicrobial Stewardship Positive Culture Follow Up   Christopher Rubio. is an 80 y.o. male who presented to [facility] on 03/31/2013 with a chief complaint of  Chief Complaint  Patient presents with  . Loss of Consciousness  . Emesis  . Dizziness    Recent Results (from the past 720 hour(s))  URINE CULTURE     Status: None   Collection Time    03/31/13  7:34 PM      Result Value Range Status   Specimen Description URINE, CLEAN CATCH   Final   Special Requests NONE   Final   Culture  Setup Time     Final   Value: 03/31/2013 20:32     Performed at [facility] Count     Final   Value: 70,000 COLONIES/ML     Performed at [facility]   Culture     Final   Value: GROUP B STREP(S.AGALACTIAE)ISOLATED     Note: TESTING AGAINST S. AGALACTIAE NOT ROUTINELY PERFORMED DUE TO PREDICTABILITY OF AMP/PEN/VAN SUSCEPTIBILITY.     Performed at [facility]   Report Status 04/03/2013 FINAL   Final     [x]  Patient discharged originally without antimicrobial agent and treatment is now indicated  New antibiotic prescription: Cephalexin 500mg  po 4 times daily for 7 days  ED Provider: Johnnette Gourd, PA-C   [name] 04/04/2013, 9:58 AM Infectious Diseases Pharmacist Phone# [phone]

## 2013-04-07 ENCOUNTER — Emergency Department (HOSPITAL_COMMUNITY): Payer: Medicare Other

## 2013-04-07 ENCOUNTER — Encounter (HOSPITAL_COMMUNITY): Payer: Self-pay | Admitting: Emergency Medicine

## 2013-04-07 ENCOUNTER — Inpatient Hospital Stay (HOSPITAL_COMMUNITY)
Admission: EM | Admit: 2013-04-07 | Discharge: 2013-04-08 | DRG: 086 | Disposition: A | Discharge status: Home / Self Care | Payer: Medicare Other | Attending: Internal Medicine | Admitting: Internal Medicine

## 2013-04-07 DIAGNOSIS — Z85828 Personal history of other malignant neoplasm of skin: Secondary | ICD-10-CM

## 2013-04-07 DIAGNOSIS — I609 Nontraumatic subarachnoid hemorrhage, unspecified: Secondary | ICD-10-CM

## 2013-04-07 DIAGNOSIS — Z7982 Long term (current) use of aspirin: Secondary | ICD-10-CM

## 2013-04-07 DIAGNOSIS — F1010 Alcohol abuse, uncomplicated: Secondary | ICD-10-CM

## 2013-04-07 DIAGNOSIS — F17200 Nicotine dependence, unspecified, uncomplicated: Secondary | ICD-10-CM | POA: Diagnosis present

## 2013-04-07 DIAGNOSIS — M19071 Primary osteoarthritis, right ankle and foot: Secondary | ICD-10-CM

## 2013-04-07 DIAGNOSIS — E860 Dehydration: Secondary | ICD-10-CM

## 2013-04-07 DIAGNOSIS — Z79899 Other long term (current) drug therapy: Secondary | ICD-10-CM

## 2013-04-07 DIAGNOSIS — M109 Gout, unspecified: Secondary | ICD-10-CM | POA: Diagnosis present

## 2013-04-07 DIAGNOSIS — Y92009 Unspecified place in unspecified non-institutional (private) residence as the place of occurrence of the external cause: Secondary | ICD-10-CM

## 2013-04-07 DIAGNOSIS — E785 Hyperlipidemia, unspecified: Secondary | ICD-10-CM | POA: Diagnosis present

## 2013-04-07 DIAGNOSIS — I10 Essential (primary) hypertension: Secondary | ICD-10-CM

## 2013-04-07 DIAGNOSIS — S066XAA Traumatic subarachnoid hemorrhage with loss of consciousness status unknown, initial encounter: Principal | ICD-10-CM | POA: Diagnosis present

## 2013-04-07 DIAGNOSIS — R55 Syncope and collapse: Secondary | ICD-10-CM

## 2013-04-07 DIAGNOSIS — J9819 Other pulmonary collapse: Secondary | ICD-10-CM | POA: Diagnosis present

## 2013-04-07 DIAGNOSIS — S066X9A Traumatic subarachnoid hemorrhage with loss of consciousness of unspecified duration, initial encounter: Principal | ICD-10-CM | POA: Diagnosis present

## 2013-04-07 DIAGNOSIS — W19XXXA Unspecified fall, initial encounter: Secondary | ICD-10-CM | POA: Diagnosis present

## 2013-04-07 DIAGNOSIS — N179 Acute kidney failure, unspecified: Secondary | ICD-10-CM

## 2013-04-07 LAB — CBC WITH DIFFERENTIAL/PLATELET
Eosinophils Absolute: 0.4 10*3/uL (ref 0.0–0.7)
Eosinophils Relative: 4 % (ref 0–5)
HCT: 39 % (ref 39.0–52.0)
Hemoglobin: 13.4 g/dL (ref 13.0–17.0)
Lymphocytes Relative: 5 % — ABNORMAL LOW (ref 12–46)
Lymphs Abs: 0.5 10*3/uL — ABNORMAL LOW (ref 0.7–4.0)
MCH: 30.9 pg (ref 26.0–34.0)
MCHC: 34.4 g/dL (ref 30.0–36.0)
MCV: 89.9 fL (ref 78.0–100.0)
Monocytes Absolute: 0.5 10*3/uL (ref 0.1–1.0)
Monocytes Relative: 5 % (ref 3–12)
Neutrophils Relative %: 86 % — ABNORMAL HIGH (ref 43–77)
RBC: 4.34 MIL/uL (ref 4.22–5.81)
WBC: 9 10*3/uL (ref 4.0–10.5)

## 2013-04-07 LAB — URINALYSIS, ROUTINE W REFLEX MICROSCOPIC
Bilirubin Urine: NEGATIVE
Hgb urine dipstick: NEGATIVE
Leukocytes, UA: NEGATIVE
Specific Gravity, Urine: 1.014 (ref 1.005–1.030)
Urobilinogen, UA: 0.2 mg/dL (ref 0.0–1.0)
pH: 5 (ref 5.0–8.0)

## 2013-04-07 LAB — POCT I-STAT TROPONIN I

## 2013-04-07 LAB — BASIC METABOLIC PANEL
BUN: 37 mg/dL — ABNORMAL HIGH (ref 6–23)
CO2: 23 mEq/L (ref 19–32)
Calcium: 8.2 mg/dL — ABNORMAL LOW (ref 8.4–10.5)
Creatinine, Ser: 1.83 mg/dL — ABNORMAL HIGH (ref 0.50–1.35)
Glucose, Bld: 103 mg/dL — ABNORMAL HIGH (ref 70–99)
Potassium: 4.8 mEq/L (ref 3.5–5.1)
Sodium: 137 mEq/L (ref 135–145)

## 2013-04-07 MED ORDER — SODIUM CHLORIDE 0.9 % IV SOLN
INTRAVENOUS | Status: DC
  Administered 2013-04-07 – 2013-04-08 (×2): via INTRAVENOUS

## 2013-04-07 MED ORDER — COLCHICINE 0.6 MG PO TABS
0.6000 mg | ORAL_TABLET | Freq: Every day | ORAL | Status: DC | PRN
  Filled 2013-04-07: qty 1

## 2013-04-07 MED ORDER — ALUM & MAG HYDROXIDE-SIMETH 200-200-20 MG/5ML PO SUSP: 30.0000 mL | Freq: Four times a day (QID) | ORAL | Status: DC | PRN

## 2013-04-07 MED ORDER — ACETAMINOPHEN 650 MG RE SUPP: 650.0000 mg | Freq: Four times a day (QID) | RECTAL | Status: DC | PRN

## 2013-04-07 MED ORDER — HYDROCODONE-ACETAMINOPHEN 5-325 MG PO TABS
1.0000 | ORAL_TABLET | Freq: Once | ORAL | Status: AC
  Administered 2013-04-07: 1 via ORAL
  Filled 2013-04-07: qty 1

## 2013-04-07 MED ORDER — ONDANSETRON HCL 4 MG/2ML IJ SOLN: 4.0000 mg | Freq: Four times a day (QID) | INTRAMUSCULAR | Status: DC | PRN

## 2013-04-07 MED ORDER — SODIUM CHLORIDE 0.9 % IJ SOLN: 3.0000 mL | Freq: Two times a day (BID) | INTRAMUSCULAR | Status: DC

## 2013-04-07 MED ORDER — HYDROCODONE-ACETAMINOPHEN 5-325 MG PO TABS
1.0000 | ORAL_TABLET | ORAL | Status: DC | PRN
  Administered 2013-04-07 – 2013-04-08 (×2): 2 via ORAL
  Filled 2013-04-07 (×2): qty 2

## 2013-04-07 MED ORDER — SODIUM CHLORIDE 0.9 % IV BOLUS (SEPSIS)
1000.0000 mL | Freq: Once | INTRAVENOUS | Status: AC
  Administered 2013-04-07: 1000 mL via INTRAVENOUS

## 2013-04-07 MED ORDER — HYPROMELLOSE (GONIOSCOPIC) 2.5 % OP SOLN: 1.0000 [drp] | Freq: Every day | OPHTHALMIC | Status: DC | PRN

## 2013-04-07 MED ORDER — ONDANSETRON HCL 4 MG PO TABS: 4.0000 mg | ORAL_TABLET | Freq: Four times a day (QID) | ORAL | Status: DC | PRN

## 2013-04-07 MED ORDER — ACETAMINOPHEN 325 MG PO TABS: 650.0000 mg | ORAL_TABLET | Freq: Four times a day (QID) | ORAL | Status: DC | PRN

## 2013-04-07 NOTE — ED Notes (Signed)
Pt reported falling and hitting LT knee. Pt had witnessed syncope episode lasting 2 mins. Pt given 324 SAS nad zofran 4mg  IV foer nausea.

## 2013-04-07 NOTE — ED Notes (Signed)
Pt unable to void at this time. 

## 2013-04-07 NOTE — H&P (Signed)
[facility] History and Physical  Joan Flores. ZOX:[id number] DOB: 1933-05-12 DOA: 04/07/2013  Referring physician: Bebe Shaggy PCP: Illene Regulus, MD   Chief Complaint: Fall  HPI: Susano Cleckler. is a 79 y.o. male with past medical history of hypertension, recent vasovagal episode came in to the hospital after a fall. Patient was recently in the emergency department precisely on 12/21 for a fall which diagnosed as vasovagal episode. Patient for the past 2 days felt he has dry skin, today he felt weak and not feeling right, he was about to go to the bathroom he said he missed a pole he used to keep his balance. Patient fell without loss of consciousness, EMS was called and they reported that he lost his consciousness. In the ED he was found to have acute renal failure with creatinine of 1.8, CT scan of the head showed small subarachnoid hemorrhage. Neurosurgery was called and they said this is very small for any type of intervention.  Review of Systems:  Constitutional: negative for anorexia, fevers and sweats Eyes: negative for irritation, redness and visual disturbance Ears, nose, mouth, throat, and face: negative for earaches, epistaxis, nasal congestion and sore throat Respiratory: negative for cough, dyspnea on exertion, sputum and wheezing Cardiovascular: negative for chest pain, dyspnea, lower extremity edema, orthopnea, palpitations and syncope Gastrointestinal: negative for abdominal pain, constipation, diarrhea, melena, nausea and vomiting Genitourinary:negative for dysuria, frequency and hematuria Hematologic/lymphatic: negative for bleeding, easy bruising and lymphadenopathy Musculoskeletal:negative for arthralgias, muscle weakness and stiff joints Neurological: fall Endocrine: negative for diabetic symptoms including polydipsia, polyuria and weight loss Allergic/Immunologic: negative for anaphylaxis, hay fever and urticaria  Past Medical History   Diagnosis Date  . Impotence of organic origin   . Contact dermatitis and other eczema, due to unspecified cause   . Benign neoplasm of colon   . Gout, unspecified   . Other and unspecified hyperlipidemia   . Unspecified essential hypertension   . Diverticulosis of colon (without mention of hemorrhage)   . Basal cell carcinoma of skin, site unspecified   . Extrinsic asthma, unspecified    Past Surgical History  Procedure Laterality Date  . Tonsillectomy     Social History:  reports that he has been smoking.  He does not have any smokeless tobacco history on file. He reports that he drinks alcohol. He reports that he does not use illicit drugs.  Allergies  Allergen Reactions  . Codeine Nausea And Vomiting and Other (See Comments)    dizziness  . Neomycin-Bacitracin Zn-Polymyx Other (See Comments)    Neosporin caused rash when covered with bandaid  . Prednisone Other (See Comments)    Lips and tongue swelled up    Family History  Problem Relation Age of Onset  . Coronary artery disease Father 74    deceased  . Heart attack Father   . Stroke Mother   . Hypertension Mother   . Diabetes Neg Hx   . Cancer Neg Hx     colon, prostate     Prior to Admission medications   Medication Sig Start Date End Date Taking? Authorizing Provider  aspirin EC 81 MG tablet Take 81 mg by mouth at bedtime.   Yes Historical Provider, MD  Cholecalciferol (VITAMIN D) 2000 UNITS CAPS Take 2,000 Units by mouth daily.   Yes Historical Provider, MD  cilostazol (PLETAL) 50 MG tablet Take 50 mg by mouth daily.   Yes Historical Provider, MD  colchicine 0.6 MG tablet Take 0.6 mg by mouth  daily as needed (gout).   Yes Historical Provider, MD  fish oil-omega-3 fatty acids 1000 MG capsule Take 1 g by mouth daily.    Yes Historical Provider, MD  furosemide (LASIX) 40 MG tablet Take 40 mg by mouth daily.   Yes Historical Provider, MD  hydroxypropyl methylcellulose (ISOPTO TEARS) 2.5 % ophthalmic solution Place  1 drop into both eyes daily as needed for dry eyes.   Yes Historical Provider, MD  ibuprofen (ADVIL,MOTRIN) 200 MG tablet Take 600 mg by mouth 3 (three) times daily as needed (pain).   Yes Historical Provider, MD  lisinopril (PRINIVIL,ZESTRIL) 20 MG tablet Take 1 tablet (20 mg total) by mouth daily. 09/20/12  Yes Jacques Navy, MD   Physical Exam: Filed Vitals:   04/07/13 1730  BP: 110/34  Pulse: 63  Temp:   Resp: 24    BP 110/34  Pulse 63  Temp(Src) 99 F (37.2 C) (Oral)  Resp 24  SpO2 100% General appearance: alert, cooperative and no distress  Head: Normocephalic, without obvious abnormality, atraumatic  Eyes: conjunctivae/corneas clear. PERRL, EOM's intact. Fundi benign.  Nose: Nares normal. Septum midline. Mucosa normal. No drainage or sinus tenderness.  Throat: lips, mucosa, and tongue normal; teeth and gums normal  Neck: Supple, no masses, no cervical lymphadenopathy, no JVD appreciated, no meningeal signs Resp: clear to auscultation bilaterally  Chest wall: no tenderness  Cardio: regular rate and rhythm, S1, S2 normal, no murmur, click, rub or gallop  GI: soft, non-tender; bowel sounds normal; no masses, no organomegaly  Extremities: extremities normal, atraumatic, no cyanosis or edema  Skin: Skin color, texture, turgor normal. No rashes or lesions  Neurologic: Alert and oriented X 3, normal strength and tone. Normal symmetric reflexes. Normal coordination and gait]  Labs on Admission:  Basic Metabolic Panel:  Recent Labs Lab 03/31/13 1906 04/03/13 1156 04/07/13 1346  NA 144 139 137  K 4.0 4.2 4.8  CL 105 106 104  CO2  --  27 23  GLUCOSE 113* 94 103*  BUN 34* 19 37*  CREATININE 1.80* 1.1 1.83*  CALCIUM  --  8.9 8.2*   Liver Function Tests: No results found for this basename: AST, ALT, ALKPHOS, BILITOT, PROT, ALBUMIN,  in the last 168 hours No results found for this basename: LIPASE, AMYLASE,  in the last 168 hours No results found for this basename:  AMMONIA,  in the last 168 hours CBC:  Recent Labs Lab 03/31/13 1906 04/07/13 1346  WBC  --  9.0  NEUTROABS  --  7.7  HGB 11.6* 13.4  HCT 34.0* 39.0  MCV  --  89.9  PLT  --  241   Cardiac Enzymes: No results found for this basename: CKTOTAL, CKMB, CKMBINDEX, TROPONINI,  in the last 168 hours  BNP (last 3 results) No results found for this basename: PROBNP,  in the last 8760 hours CBG: No results found for this basename: GLUCAP,  in the last 168 hours  Radiological Exams on Admission: Dg Chest 2 View  04/07/2013   CLINICAL DATA:  Hypertension; loss of consciousness  EXAM: CHEST  2 VIEW  COMPARISON:  None.  FINDINGS: There is mild eventration of the left hemidiaphragm. There is mild left base atelectasis. Lungs are otherwise clear. Heart is upper normal in size with normal pulmonary vascularity. No adenopathy. There is atherosclerotic change in the aorta. No bone lesions.  IMPRESSION: Mild left base atelectasis.  No edema or consolidation.   Electronically Signed   By: Bretta Bang  M.D.   On: 04/07/2013 16:23   Ct Head Wo Contrast  04/07/2013   CLINICAL DATA:  Syncope post trauma  EXAM: CT HEAD WITHOUT CONTRAST  TECHNIQUE: Contiguous axial images were obtained from the base of the skull through the vertex without intravenous contrast.  COMPARISON:  None.  FINDINGS: There is mild diffuse atrophy. There is a small amount of subarachnoid hemorrhage in the right frontal lobe, best seen on axial slice 13. The no other focal hemorrhage is seen. There is no mass, extra-axial fluid collection, or midline shift. There is patchy small vessel disease in the centra semiovale bilaterally. No acute infarct is apparent.  Bony calvarium appears intact. The mastoid air cells are clear. There is moderate mucosal thickening in the right maxillary antrum. There is mild ethmoid air cell disease bilaterally.  IMPRESSION: Small amount of subarachnoid hemorrhage in the right frontal lobe. There is atrophy  with periventricular small vessel disease. No mass. No subdural or epidural fluid. Paranasal sinus disease as noted above.  Critical Value/emergent results were called by telephone at the time of interpretation on 04/07/2013 at 4:39 PM to Dr. Bebe Shaggy , who verbally acknowledged these results.   Electronically Signed   By: Bretta Bang M.D.   On: 04/07/2013 16:40   Dg Knee Complete 4 Views Left  04/07/2013   CLINICAL DATA:  Fall with laceration and pain.  EXAM: LEFT KNEE - COMPLETE 4+ VIEW  COMPARISON:  None.  FINDINGS: No evidence of fracture, dislocation or focal lesion. There is a tiny amount of joint fluid. Arterial calcification is noted.  IMPRESSION: Tiny amount of joint fluid. No evidence of fracture or significant degenerative change.   Electronically Signed   By: Paulina Fusi M.D.   On: 04/07/2013 16:23    EKG: Independently reviewed.   Assessment/Plan Principal Problem:   SDH (subdural hematoma) Active Problems:   Syncope   HTN (hypertension)   ARF (acute renal failure)   Alcohol abuse   Fall -This is unlikely to be syncope, sounds like mechanical fall, patient said he missed his bedside pole. -Also he did not lose his consciousness after the fall, he had brief loss of consciousness later. - This could be secondary to dehydration and acute renal failure. -Hydrate with IV fluids, check orthostatic blood pressure in the morning.  Subdural hematoma -Very small bleed, neurosurgery was consulted in the emergency department and recommend to observe. -Repeat CT scan in the morning. -Patient does not have any neurological deficits.  Acute renal failure -Patient is on lisinopril, ibuprofen and Lasix. -Hold nephrotoxic medications, hydrate with IV fluids. BUN/creatinine ratio indicates prerenal ARF.  Alcohol abuse -Patient said he drinks on the weekends and he did not drink since last episode when he fell. -When I walked out of the room his wife followed me and told me that  he drinks everyday. -He does not let her know, she drinks because he drinks has at home in small studio in his backyard. -Will watch for any signs of alcohol withdrawal.  Code Status: Full code Family Communication: Plan discussed with the patient in the presence wife and daughter at bedside. Disposition Plan: Inpatient, telemetry  Time spent: 70 minutes  [facility] A [facility] Pager [phone]

## 2013-04-07 NOTE — ED Provider Notes (Signed)
Pt stable at this time D/w dr Phoebe Perch, he has reviewed CT imaging and does not feel this is [facility] Will admit for syncope D/w dr Arthor Captain, will admit   Joya Gaskins, MD 04/07/13 [phone]

## 2013-04-07 NOTE — ED Notes (Signed)
PT  Attempted to void for sample. Pt unable to void at this time.

## 2013-04-07 NOTE — ED Provider Notes (Signed)
CSN: [id number]     Arrival date & time 04/07/13  1332 History   First MD Initiated Contact with Patient 04/07/13 1343     Chief Complaint  Patient presents with  . Loss of Consciousness   (Consider location/radiation/quality/duration/timing/severity/associated sxs/prior Treatment) HPI  This is a 79 year old with recent history of syncope who presents by EMS with a syncopal episode at home. Initially patient was brought in as a code STEMI; however, he has been chest pain-free and repeat EKG shows no evidence of ST elevation. Code STEMI was discontinued. Patient reports that one week ago he was seen for syncope. Syncope was in the setting of acute alcohol intoxication and dehydration. He had a workup at that time and was discharged home. Per the patient, he got up from a nap stood up on his left knee which gave way and made him fall. He was found by his daughter on the ground unresponsive. He reports left knee pain. The daughter called EMS. During their evaluation, EMS sat the patient up from laying flat and he had an episode of syncope. The episodes of syncope were in the setting of going from lying to standing or lying to sitting. Patient denies any shortness of breath or chest pain during these times. He denies any recent alcohol use.  Past Medical History  Diagnosis Date  . Impotence of organic origin   . Contact dermatitis and other eczema, due to unspecified cause   . Benign neoplasm of colon   . Gout, unspecified   . Other and unspecified hyperlipidemia   . Unspecified essential hypertension   . Diverticulosis of colon (without mention of hemorrhage)   . Basal cell carcinoma of skin, site unspecified   . Extrinsic asthma, unspecified    Past Surgical History  Procedure Laterality Date  . Tonsillectomy     Family History  Problem Relation Age of Onset  . Coronary artery disease Father 46    deceased  . Heart attack Father   . Stroke Mother   . Hypertension Mother   . Diabetes  Neg Hx   . Cancer Neg Hx     colon, prostate   History  Substance Use Topics  . Smoking status: Current Every Day Smoker  . Smokeless tobacco: Not on file  . Alcohol Use: Yes    Review of Systems  Constitutional: Negative.  Negative for fever.  Respiratory: Negative.  Negative for chest tightness and shortness of breath.   Cardiovascular: Negative.  Negative for chest pain.  Gastrointestinal: Negative.  Negative for vomiting, abdominal pain and diarrhea.  Genitourinary: Negative.  Negative for dysuria.  Musculoskeletal: Negative for back pain.       Knee pain  Skin: Negative for rash.  Neurological: Positive for syncope. Negative for dizziness, weakness and headaches.  All other systems reviewed and are negative.    Allergies  Codeine; Neomycin-bacitracin zn-polymyx; and Prednisone  Home Medications   Current Outpatient Rx  Name  Route  Sig  Dispense  Refill  . aspirin EC 81 MG tablet   Oral   Take 81 mg by mouth at bedtime.         . Cholecalciferol (VITAMIN D) 2000 UNITS CAPS   Oral   Take 2,000 Units by mouth daily.         . cilostazol (PLETAL) 50 MG tablet   Oral   Take 50 mg by mouth daily.         . colchicine 0.6 MG tablet   Oral  Take 0.6 mg by mouth daily as needed (gout).         . fish oil-omega-3 fatty acids 1000 MG capsule   Oral   Take 1 g by mouth daily.          . furosemide (LASIX) 40 MG tablet   Oral   Take 40 mg by mouth daily.         . hydroxypropyl methylcellulose (ISOPTO TEARS) 2.5 % ophthalmic solution   Both Eyes   Place 1 drop into both eyes daily as needed for dry eyes.         Marland Kitchen ibuprofen (ADVIL,MOTRIN) 200 MG tablet   Oral   Take 600 mg by mouth 3 (three) times daily as needed (pain).         Marland Kitchen lisinopril (PRINIVIL,ZESTRIL) 20 MG tablet   Oral   Take 1 tablet (20 mg total) by mouth daily.   90 tablet   3    BP 110/34  Pulse 63  Temp(Src) 99 F (37.2 C) (Oral)  Resp 24  SpO2 100% Physical Exam   Nursing note and vitals reviewed. Constitutional: He is oriented to person, place, and time. He appears well-developed and well-nourished. No distress.  HENT:  Head: Normocephalic and atraumatic.  Eyes: Pupils are equal, round, and reactive to light.  Neck: Neck supple.  Normal range of motion, no C-spine  Cardiovascular: Normal rate, regular rhythm and normal heart sounds.   No murmur heard. Pulmonary/Chest: Effort normal and breath sounds normal. No respiratory distress. He has no wheezes.  Abdominal: Soft. Bowel sounds are normal. There is no tenderness. There is no rebound.  Musculoskeletal: He exhibits no edema.  Pain with range of motion of the left knee, small effusion noted, no evidence of contusion or abrasion  Lymphadenopathy:    He has no cervical adenopathy.  Neurological: He is alert and oriented to person, place, and time.  Skin: Skin is warm and dry.  Psychiatric: He has a normal mood and affect.    ED Course  Procedures (including critical care time) Labs Review Labs Reviewed  CBC WITH DIFFERENTIAL - Abnormal; Notable for the following:    Neutrophils Relative % 86 (*)    Lymphocytes Relative 5 (*)    Lymphs Abs 0.5 (*)    All other components within normal limits  BASIC METABOLIC PANEL - Abnormal; Notable for the following:    Glucose, Bld 103 (*)    BUN 37 (*)    Creatinine, Ser 1.83 (*)    Calcium 8.2 (*)    GFR calc non Af Amer 34 (*)    GFR calc Af Amer 39 (*)    All other components within normal limits  ETHANOL  URINALYSIS, ROUTINE W REFLEX MICROSCOPIC  POCT I-STAT TROPONIN I   Imaging Review Dg Chest 2 View  04/07/2013   CLINICAL DATA:  Hypertension; loss of consciousness  EXAM: CHEST  2 VIEW  COMPARISON:  None.  FINDINGS: There is mild eventration of the left hemidiaphragm. There is mild left base atelectasis. Lungs are otherwise clear. Heart is upper normal in size with normal pulmonary vascularity. No adenopathy. There is atherosclerotic change  in the aorta. No bone lesions.  IMPRESSION: Mild left base atelectasis.  No edema or consolidation.   Electronically Signed   By: Bretta Bang M.D.   On: 04/07/2013 16:23   Ct Head Wo Contrast  04/07/2013   CLINICAL DATA:  Syncope post trauma  EXAM: CT HEAD WITHOUT CONTRAST  TECHNIQUE:  Contiguous axial images were obtained from the base of the skull through the vertex without intravenous contrast.  COMPARISON:  None.  FINDINGS: There is mild diffuse atrophy. There is a small amount of subarachnoid hemorrhage in the right frontal lobe, best seen on axial slice 13. The no other focal hemorrhage is seen. There is no mass, extra-axial fluid collection, or midline shift. There is patchy small vessel disease in the centra semiovale bilaterally. No acute infarct is apparent.  Bony calvarium appears intact. The mastoid air cells are clear. There is moderate mucosal thickening in the right maxillary antrum. There is mild ethmoid air cell disease bilaterally.  IMPRESSION: Small amount of subarachnoid hemorrhage in the right frontal lobe. There is atrophy with periventricular small vessel disease. No mass. No subdural or epidural fluid. Paranasal sinus disease as noted above.  Critical Value/emergent results were called by telephone at the time of interpretation on 04/07/2013 at 4:39 PM to Dr. Bebe Shaggy , who verbally acknowledged these results.   Electronically Signed   By: Bretta Bang M.D.   On: 04/07/2013 16:40   Dg Knee Complete 4 Views Left  04/07/2013   CLINICAL DATA:  Fall with laceration and pain.  EXAM: LEFT KNEE - COMPLETE 4+ VIEW  COMPARISON:  None.  FINDINGS: No evidence of fracture, dislocation or focal lesion. There is a tiny amount of joint fluid. Arterial calcification is noted.  IMPRESSION: Tiny amount of joint fluid. No evidence of fracture or significant degenerative change.   Electronically Signed   By: Paulina Fusi M.D.   On: 04/07/2013 16:23    EKG Interpretation     Date/Time:  Sunday April 07 2013 13:36:38 EST Ventricular Rate:  66 PR Interval:  98 QRS Duration: 76 QT Interval:  381 QTC Calculation: 399 R Axis:   21 Text Interpretation:  Ectopic atrial rhythm Short PR interval Confirmed by Gleen Ripberger  MD, Taffy Delconte ([id number]) on 04/07/2013 3:59:28 PM            MDM   1. Syncope   2. Dehydration   3.  SAH  Patient with fall and syncope.  NOntoxic and nonfocal on exam.  2nd visit for syncope in 1 week. W/u initiated.  CT obtained given fall.  W/u notable for small frontal SAH likely traumatic 2/2 fall.  Patient's syncope is suspicious of orthostasis given positional component.  Patient will be admitted for observation.    Shon Baton, MD 04/07/13 Rickey Primus

## 2013-04-08 ENCOUNTER — Inpatient Hospital Stay (HOSPITAL_COMMUNITY): Payer: Medicare Other

## 2013-04-08 ENCOUNTER — Other Ambulatory Visit: Payer: Self-pay | Admitting: Internal Medicine

## 2013-04-08 DIAGNOSIS — N179 Acute kidney failure, unspecified: Secondary | ICD-10-CM

## 2013-04-08 DIAGNOSIS — I609 Nontraumatic subarachnoid hemorrhage, unspecified: Secondary | ICD-10-CM

## 2013-04-08 LAB — BASIC METABOLIC PANEL
BUN: 45 mg/dL — ABNORMAL HIGH (ref 6–23)
Calcium: 7.8 mg/dL — ABNORMAL LOW (ref 8.4–10.5)
Chloride: 103 mEq/L (ref 96–112)
GFR calc Af Amer: 35 mL/min — ABNORMAL LOW (ref >90)
Potassium: 5 mEq/L (ref 3.5–5.1)

## 2013-04-08 LAB — CBC
HCT: 37.4 % — ABNORMAL LOW (ref 39.0–52.0)
Hemoglobin: 12.8 g/dL — ABNORMAL LOW (ref 13.0–17.0)
MCHC: 34.2 g/dL (ref 30.0–36.0)
Platelets: 270 10*3/uL (ref 150–400)
WBC: 11.5 10*3/uL — ABNORMAL HIGH (ref 4.0–10.5)

## 2013-04-08 NOTE — Evaluation (Signed)
Physical Therapy Evaluation Patient Details Name: Christopher Rubio. MRN: [id number] DOB: Dec 12, 1933 Today's Date: 04/08/2013 Time: 4098-1191 PT Time Calculation (min): 29 min  PT Assessment / Plan / Recommendation History of Present Illness  Christopher Rubio. is a 79 y.o. male with past medical history of hypertension, recent vasovagal episode came in to the hospital after a fall. Patient was recently in the emergency department precisely on 12/21 for a fall which diagnosed as vasovagal episode. Patient for the past 2 days felt he has dry skin, today he felt weak and not feeling right, he was about to go to the bathroom he said he missed a pole he used to keep his balance. Patient fell without loss of consciousness, EMS was called and they reported that he lost his consciousness.  Clinical Impression  Pt functioning fairly well given the injury sustained in his left knee.  Antalgic limp remains, but pt is steady.  Should be fine at home with available assist.  No follow up.  D/C for acute PT.    PT Assessment  Patent does not need any further PT services    Follow Up Recommendations  No PT follow up    Does the patient have the potential to tolerate intense rehabilitation      Barriers to Discharge        Equipment Recommendations  None recommended by PT    Recommendations for Other Services     Frequency      Precautions / Restrictions Precautions Precautions: None   Pertinent Vitals/Pain Greater than or equal to 2/10 L knee      Mobility  Bed Mobility Bed Mobility: Not assessed Transfers Transfers: Sit to Stand;Stand to Sit Sit to Stand: 6: Modified independent (Device/Increase time) Stand to Sit: 6: Modified independent (Device/Increase time) Details for Transfer Assistance: used hands approp. for safety Ambulation/Gait Ambulation/Gait Assistance: 5: Supervision Ambulation Distance (Feet): 200 Feet Assistive device: Other (Comment) (with/without IV  pole) Ambulation/Gait Assistance Details: antalgic limp on L due to knee pain, but remained steady Gait Pattern: Step-through pattern Stairs: Yes Stairs Assistance: 5: Supervision Stairs Assistance Details (indicate cue type and reason): functional Stair Management Technique: One rail Right;Step to pattern;Forwards Number of Stairs: 3 Wheelchair Mobility Wheelchair Mobility: No    Exercises     PT Diagnosis:    PT Problem List:   PT Treatment Interventions:       PT Goals(Current goals can be found in the care plan section)    Visit Information  Last PT Received On: 04/08/13 Assistance Needed: +1 History of Present Illness: Christopher Rubio. is a 79 y.o. male with past medical history of hypertension, recent vasovagal episode came in to the hospital after a fall. Patient was recently in the emergency department precisely on 12/21 for a fall which diagnosed as vasovagal episode. Patient for the past 2 days felt he has dry skin, today he felt weak and not feeling right, he was about to go to the bathroom he said he missed a pole he used to keep his balance. Patient fell without loss of consciousness, EMS was called and they reported that he lost his consciousness.       Prior Functioning  Home Living Family/patient expects to be discharged to:: Private residence Living Arrangements: Spouse/significant other Available Help at Discharge: Family;Available 24 hours/day Type of Home: House Home Access: Stairs to enter [employer] of Steps: 3 Entrance Stairs-Rails: Right;Left Home Layout: One level Home Equipment: None Prior Function Level  of Independence: Independent Communication Communication: No difficulties    Cognition  Cognition Arousal/Alertness: Awake/alert Behavior During Therapy: WFL for tasks assessed/performed Overall Cognitive Status: Within Functional Limits for tasks assessed    Extremity/Trunk Assessment Upper Extremity Assessment Upper  Extremity Assessment: Overall WFL for tasks assessed Lower Extremity Assessment Lower Extremity Assessment: LLE deficits/detail LLE Deficits / Details: weak due to knee pain LLE: Unable to fully assess due to pain   Balance Balance Balance Assessed: No  End of Session PT - End of Session Activity Tolerance: Patient tolerated treatment well Patient left: in chair;with call bell/phone within reach;with chair alarm set Nurse Communication: Mobility status  GP     Christopher Rubio, Eliseo Gum 04/08/2013, 4:02 PM 04/08/2013  Dupont Bing, PT [phone]  (pager)

## 2013-04-08 NOTE — Progress Notes (Signed)
Went over discharge instructions with patient. IV d/c'd. Patient taken off cardiac monitor. Patient discharged home with family. Stanton Kidney R

## 2013-04-08 NOTE — Progress Notes (Signed)
Subjective: Christopher Rubio reports he had a fall at home: missed the post on the bed when getting up, fell backward with his knee under him. His major complaint is knee pain. He did not loose consciousness or have a feinting spell. He continues to have knee pain.   Objective: Lab:  Recent Labs  04/07/13 1346 04/08/13 0525  WBC 9.0 11.5*  NEUTROABS 7.7  --   HGB 13.4 12.8*  HCT 39.0 37.4*  MCV 89.9 90.3  PLT 241 270    Recent Labs  04/07/13 1346 04/08/13 0525  NA 137 134*  K 4.8 5.0  CL 104 103  GLUCOSE 103* 104*  BUN 37* 45*  CREATININE 1.83* 2.03*  CALCIUM 8.2* 7.8*    Imaging: 04/07/13 CT brain: IMPRESSION:  Small amount of subarachnoid hemorrhage in the right frontal lobe.  There is atrophy with periventricular small vessel disease. No mass.  No subdural or epidural fluid. Paranasal sinus disease as noted  above.  CXR : IMPRESSION:  Mild left base atelectasis. No edema or consolidation.  Left Knee: IMPRESSION:  Tiny amount of joint fluid. No evidence of fracture or significant  degenerative change.    Scheduled Meds: . sodium chloride  3 mL Intravenous Q12H   Continuous Infusions: . sodium chloride 100 mL/hr at 04/07/13 2145   PRN Meds:.acetaminophen, acetaminophen, alum & mag hydroxide-simeth, colchicine, HYDROcodone-acetaminophen, hydroxypropyl methylcellulose, ondansetron (ZOFRAN) IV, ondansetron   Physical Exam: Filed Vitals:   04/08/13 0436  BP: 110/44  Pulse: 70  Temp: 98.9 F (37.2 C)  Resp: 16  gen'l - older man in no distress sitting in a geri-chair, in no distress HEENT - no signs of trauma Cor - 2+ radial, RRR Pulm - normal respirations Neuro - A&O, speech clear, cognition normal. Ext - left knee w/o large effusion, able to actively straighten out the leg.       Assessment/Plan: 1. Fall - no lOC  2. Knee pain - x-ray normal. Suspect strain/sprain. Plan  PT eval prior to discharge - assess for any home care needs.  3. Renal  insufficiency - he seems intolerant of furosemide. At most recent ED visit Cr 1.8, off furosemide Cr 1.1 Plan D/c furosemide.  4. HTN - monitor off duretics.    Illene Regulus [facility] (o) [phone]; (c) [phone] Call-grp - [facility]  Tele: [phone]  04/08/2013, 6:35 AM

## 2013-04-08 NOTE — Progress Notes (Signed)
PM note: CT reviewed - no change or progression PT eval - no need for PT services or appliances Pt is stable and ready for d/c home.  Dictated # [id number]

## 2013-04-09 NOTE — Discharge Summary (Signed)
NAMESTEPHAN, Rubio NO.:  [id number]  MEDICAL RECORD NO.:  [id number]  LOCATION:  2W17C                        FACILITY:  [facility]  PHYSICIAN:  Rosalyn Gess. Norins, MD  DATE OF BIRTH:  1934/02/29  DATE OF ADMISSION:  04/07/2013 DATE OF DISCHARGE:  04/08/2013                              DISCHARGE SUMMARY   ADMITTING DIAGNOSES: 1. Subarachnoid hemorrhage after a fall. 2. Knee pain, left. 3. Chest x-ray, which showed mild left base atelectasis, no edema or     consolidation. 4. Diagnostic knee films complete, which showed tiny amount of joint     fluid.  No evidence of fracture or significant degenerative change.  DISCHARGE DIAGNOSES: 1. Subarachnoid hemorrhage after a fall. 2. Knee pain, left. 3. Chest x-ray, which showed mild left base atelectasis, No edema or     consolidation. 4. Diagnostic knee films complete, which showed tiny amount of joint     fluid.  No evidence of fracture or significant degenerative change.  CONSULTANTS:  None.  IMAGING:  CT scan of the head without contrast on April 07, 2013, which showed small amount of subarachnoid hemorrhage in the right frontal lobe.  Atrophy with periventricular small vessel disease is noted.  No mass.  No subdural or epidural fluid.  Paranasal sinuses as noted with some chronic change.  CT scan of the brain without contrast on April 08, 2013, which showed no acute intracranial abnormality.  No significant progression or change from previous study.  HISTORY OF PRESENT ILLNESS:  Christopher Rubio is a 79 year old gentleman recently seen in the office after a syncopal episode thought to be vasovagal in nature after a thorough evaluation.  The patient was at home and awoke in the middle of the night and needed to go to the bathroom.  He has a 4 poster bed and usually pulled up on 1 of the post, that he missed his grab through the post and fell.  He had no loss of consciousness.  Because of the fall, he was  brought to the emergency department for evaluation.  He was noted on lab studies to have a creatinine that had gone to 1.8 up from 1.1 in the office several days prior.  CT scan of the head showed small subarachnoid hemorrhage. Neurosurgery reviewed this and felt there is no indication for intervention.  The patient was admitted on observation because of the subarachnoid hemorrhage and painful knee.  He did have knee films that were unremarkable.  He did make a time-out here. Diagnostic knee films complete, which showed tiny amount of joint fluid.  No evidence of fracture or significant degenerative change.  Please see the H and P for the patient's past medical history, family history, social history, and admission exam.  Please also see recent office visit in Epic.  HOSPITAL COURSE: 1. Subarachnoid hemorrhage, the patient was asymptomatic.  He denied     any headache. He had no stiffness of the neck and had no neurologic     symptoms.  The patient did have a followup CT of the brain, which     showed no change or progression of the subarachnoid bleed.  He was     fully awake  and cognizant on the morning of discharge.  At this     point, no further evaluation is needed.  The patient will hold     aspirin until seen in the office for followup. 2. Knee pain. The patient with no obvious effusion on his knee at     exam.  The x-rays were reviewed and negative.  The patient was seen     by Physical Therapy, who felt the patient had a mildly antalgic     gait, but that he was stable and steady on his feet.  No further     physical therapy was recommended and no use of any support device     such as cane or walker was recommended. 3. With the patient having no progression of subarachnoid hemorrhage     with no neurologic symptoms and with PT evaluation being completed,     he is now ready for discharge to home.  DISCHARGE EXAMINATION:  Physical examination performed on the morning of the day  of discharge. VITAL SIGNS:  Temperature of 98.3, blood pressure 110/42, heart rate 71, respirations 20, oxygen saturation 100%. GENERAL APPEARANCE:  Patient is a pleasant older gentleman sitting in a Geri chair, in no acute distress. HEENT:  Normocephalic, atraumatic.  No Battle sign.  No raccoon's eyes. Pupils were equal, round, and reactive. NECK:  Supple. CHEST:  Patient is moving air well with no rales, wheezes, or rhonchi. CARDIOVASCULAR:  2+ radial pulse.  No JVD.  A quiet precordium with a regular rate and rhythm. ABDOMEN:  Soft.  No guarding or rebound. EXTREMITIES:  Patient's left knee was somewhat tender to touch.  There was no effusion.  There was no erythema.  Patient was able to extend his leg fully without significant pain or discomfort.  DISCHARGE MEDICATIONS: 1. Aspirin is on hold until seen in followup. 2. Vitamin D 2000 international units daily. 3. Pletal 50 mg by mouth daily. 4. Colchicine 0.6 mg by mouth as needed for gout flare. 5. Fish oil 1 g by mouth daily. 6. Furosemide is to be put on hold. 7. Isopto Tears 2.5% ophthalmic solution, 1 drop in each eye as     needed. 8. Ibuprofen 200 mg up to 3 tablets every 8 hours as needed for pain. 9. Lisinopril 20 mg p.o. daily.  DISPOSITION:  The patient is discharged to home.  He will be seen in the office for followup in 1 week.  The patient is to hold Lasix and hold aspirin until seen in followup.  The patient's condition at time of discharge dictation is stable.     Rosalyn Gess Norins, MD     MEN/MEDQ  D:  04/08/2013  T:  04/09/2013  Job:  [id number]

## 2013-04-12 ENCOUNTER — Other Ambulatory Visit: Payer: Medicare Other

## 2013-04-12 ENCOUNTER — Telehealth: Payer: Self-pay | Admitting: Internal Medicine

## 2013-04-12 DIAGNOSIS — N179 Acute kidney failure, unspecified: Secondary | ICD-10-CM

## 2013-04-12 LAB — BASIC METABOLIC PANEL
BUN: 19 mg/dL (ref 6–23)
CALCIUM: 9.1 mg/dL (ref 8.4–10.5)
CO2: 27 mEq/L (ref 19–32)
CREATININE: 1.3 mg/dL (ref 0.4–1.5)
Chloride: 106 mEq/L (ref 96–112)
GFR: 58.95 mL/min — AB (ref >60.00)
Glucose, Bld: 95 mg/dL (ref 70–99)
Potassium: 4.9 mEq/L (ref 3.5–5.1)
Sodium: 140 mEq/L (ref 135–145)

## 2013-04-12 MED ORDER — OSELTAMIVIR PHOSPHATE 75 MG PO CAPS: 75.0000 mg | ORAL_CAPSULE | Freq: Every day | ORAL | Status: DC

## 2013-04-12 NOTE — Telephone Encounter (Signed)
tamiflu 75 mg once a day for 10 days for household contact protection #10

## 2013-04-12 NOTE — Telephone Encounter (Signed)
Patient has been notified Tamiflu has been sent to his pharmacy

## 2013-04-12 NOTE — Telephone Encounter (Signed)
Patients daughter is in from [place].  She caught the flu upon arrival.  He is requesting a script for Tamaflu to be called into his pharmacy at [facility] at [address].  Patient is requesting a call in regards.

## 2013-04-15 ENCOUNTER — Ambulatory Visit (INDEPENDENT_AMBULATORY_CARE_PROVIDER_SITE_OTHER): Payer: Medicare Other | Admitting: Family Medicine

## 2013-04-15 ENCOUNTER — Ambulatory Visit (INDEPENDENT_AMBULATORY_CARE_PROVIDER_SITE_OTHER): Payer: Medicare Other | Admitting: Internal Medicine

## 2013-04-15 ENCOUNTER — Encounter: Payer: Self-pay | Admitting: Family Medicine

## 2013-04-15 ENCOUNTER — Encounter: Payer: Self-pay | Admitting: Internal Medicine

## 2013-04-15 VITALS — BP 180/86 | HR 72 | Temp 96.9°F | Wt 158.0 lb

## 2013-04-15 DIAGNOSIS — I609 Nontraumatic subarachnoid hemorrhage, unspecified: Secondary | ICD-10-CM

## 2013-04-15 DIAGNOSIS — N179 Acute kidney failure, unspecified: Secondary | ICD-10-CM

## 2013-04-15 DIAGNOSIS — M25462 Effusion, left knee: Secondary | ICD-10-CM

## 2013-04-15 DIAGNOSIS — M25469 Effusion, unspecified knee: Secondary | ICD-10-CM

## 2013-04-15 NOTE — Patient Instructions (Signed)
After hospital visit for fall and subarchnoid hemorrhage.  You seem to be doing fine. It is ok to restart one aspirin a day.  Please Restart your lasix once a day for the systolic blood pressure.  You will see Dr. Tamala Julian, sports medicine,  now for evaluation of your knee.  After seeing Dr. Tamala Julian stop by the lab so we can check you kidney function.

## 2013-04-15 NOTE — Progress Notes (Signed)
Pre-visit discussion using our clinic review tool. No additional management support is needed unless otherwise documented below in the visit note.  

## 2013-04-15 NOTE — Progress Notes (Signed)
Pre visit review using our clinic review tool, if applicable. No additional management support is needed unless otherwise documented below in the visit note. 

## 2013-04-15 NOTE — Progress Notes (Signed)
  I'm seeing this patient by the request  of:  Adella Hare, MD  CC: Left knee swelling  HPI: Patient is a very pleasant 80 year old gentleman who unfortunately had a fall causing him to be admitted to the hospital for a subdural hematoma. Patient is doing very well after this. Patient is following up to with primary care provider was having left knee pain from the fall. Patient did on the left side sending hitting his left knee extremely hard. Patient had swelling immediately and has had trouble with ambulation since then. Patient was there since April 07, 2013. Patient has had the swelling for one week and hasn't noticed minimal improvement. Patient states during the day it seems it worse it is at its best during the morning. Patient has trouble with flexing of the knee. Patient denies any radiation of pain or any numbness. Patient states that the pain has completely almost resolved at this time. Patient though states that the swelling is was more concerning. Severity is approximately 6/10  Past medical, surgical, family and social history reviewed. Medications reviewed all in the electronic medical record.   Review of Systems: No headache, visual changes, nausea, vomiting, diarrhea, constipation, dizziness, abdominal pain, skin rash, fevers, chills, night sweats, weight loss, swollen lymph nodes, body aches, joint swelling, muscle aches, chest pain, shortness of breath, mood changes.   Objective:    Blood pressure 180/86, pulse 72, temperature 96.9 F (36.1 C), temperature source Oral, weight 158 lb (71.668 kg), SpO2 96.00%.   General: No apparent distress alert and oriented x3 mood and affect normal, dressed appropriately.  HEENT: Pupils equal, extraocular movements intact Respiratory: Patient's speak in full sentences and does not appear short of breath Cardiovascular: No lower extremity edema, non tender, no erythema Skin: Warm dry intact with no signs of infection or rash on  extremities or on axial skeleton. Abdomen: Soft nontender Neuro: Cranial nerves II through XII are intact, neurovascularly intact in all extremities with 2+ DTRs and 2+ pulses. Lymph: No lymphadenopathy of posterior or anterior cervical chain or axillae bilaterally.  Gait normal with good balance and coordination.  MSK: Non tender with full range of motion and good stability and symmetric strength and tone of shoulders, elbows, wrist, hip,  and ankles bilaterally.  Knee: Left On inspection patient does have a 2+ effusion of the left knee. Palpation normal with no warmth, joint line tenderness, patellar tenderness, or condyle tenderness. Patient does have decreased range of motion with flexion to only 80 but full extension. Ligaments with solid consistent endpoints including ACL, PCL, LCL, MCL. Negative Mcmurray's, Apley's, and Thessalonian tests. Mild painful patellar compression. Patellar glide with mild crepitus. Patellar and quadriceps tendons unremarkable. Hamstring and quadriceps strength is normal.  Contralateral knee unremarkable with some mild atrophy of the quadriceps noted.   Impression and Recommendations:     This case required medical decision making of moderate complexity.

## 2013-04-15 NOTE — Patient Instructions (Signed)
Very nice to meet you You do have a knee effusion Wear the compression starting right in the am.  dO NOT WEAR IT AT NIGHT cOME BACK IN 1 WEEK TO MAKE SURE YOU ARE BETTER.  iCE 20 MINUTES 2 TIMES A DAY  Knee Effusion The medical term for having fluid in your knee is effusion. This is often due to an internal derangement of the knee. This means something is wrong inside the knee. Some of the causes of fluid in the knee may be torn cartilage, a torn ligament, or bleeding into the joint from an injury. Your knee is likely more difficult to bend and move. This is often because there is increased pain and pressure in the joint. The time it takes for recovery from a knee effusion depends on different factors, including:   Type of injury.  Your age.  Physical and medical conditions.  Rehabilitation Strategies. How long you will be away from your normal activities will depend on what kind of knee problem you have and how much damage is present. Your knee has two types of cartilage. Articular cartilage covers the bone ends and lets your knee bend and move smoothly. Two menisci, thick pads of cartilage that form a rim inside the joint, help absorb shock and stabilize your knee. Ligaments bind the bones together and support your knee joint. Muscles move the joint, help support your knee, and take stress off the joint itself. CAUSES  Often an effusion in the knee is caused by an injury to one of the menisci. This is often a tear in the cartilage. Recovery after a meniscus injury depends on how much meniscus is damaged and whether you have damaged other knee tissue. Small tears may heal on their own with conservative treatment. Conservative means rest, limited weight bearing activity and muscle strengthening exercises. Your recovery may take up to 6 weeks.  TREATMENT  Larger tears may require surgery. Meniscus injuries may be treated during arthroscopy. Arthroscopy is a procedure in which your surgeon uses a  small telescope like instrument to look in your knee. Your caregiver can make a more accurate diagnosis (learning what is wrong) by performing an arthroscopic procedure. If your injury is on the inner margin of the meniscus, your surgeon may trim the meniscus back to a smooth rim. In other cases your surgeon will try to repair a damaged meniscus with stitches (sutures). This may make rehabilitation take longer, but may provide better long term result by helping your knee keep its shock absorption capabilities. Ligaments which are completely torn usually require surgery for repair. HOME CARE INSTRUCTIONS  Use crutches as instructed.  If a brace is applied, use as directed.  Once you are home, an ice pack applied to your swollen knee may help with discomfort and help decrease swelling.  Keep your knee raised (elevated) when you are not up and around or on crutches.  Only take over-the-counter or prescription medicines for pain, discomfort, or fever as directed by your caregiver.  Your caregivers will help with instructions for rehabilitation of your knee. This often includes strengthening exercises.  You may resume a normal diet and activities as directed. SEEK MEDICAL CARE IF:   There is increased swelling in your knee.  You notice redness, swelling, or increasing pain in your knee.  An unexplained oral temperature above 102 F (38.9 C) develops. SEEK IMMEDIATE MEDICAL CARE IF:   You develop a rash.  You have difficulty breathing.  You have any allergic reactions  from medications you may have been given.  There is severe pain with any motion of the knee. MAKE SURE YOU:   Understand these instructions.  Will watch your condition.  Will get help right away if you are not doing well or get worse. Document Released: 06/18/2003 Document Revised: 06/20/2011 Document Reviewed: 08/22/2007 [facility] Patient Information 2014 [place].

## 2013-04-15 NOTE — Assessment & Plan Note (Signed)
Patient does have a significant left knee effusion and would do very well with aspiration. 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 Patient was given a knee brace which was fitted by me today. Discussed proper care with icing. We'll avoid anti-inflammatories secondary to his recent subarachnoid hematoma. Patient will wear the compression sleeve that was given to him today as well. Patient given range of motion exercises and will followup in one week. If the swelling is not better I would like to potentially do the aspiration and steroid injection.  X-rays were reviewed by me and in the hospital there was no bony abnormality.

## 2013-04-15 NOTE — Progress Notes (Signed)
Subjective:    Patient ID: Christopher Aid., male    DOB: Jul 09, 1933, 80 y.o.   MRN: [id number]  HPI Christopher Rubio presents for hospital follow-up. He fell out of bed and hit his head. He was evaluated in the ED and found to have a SAH. He was admitted and observed. Follow up CT brain with no change in [facility]. He was neurologically intact and was able to be discharged to home.  He had left knee pain after the fall. His exam in hospital revealed no derangement. He was seen by PT who felt he did not need therapy or assist device. Since discharge he has had increasing pain and swelling of the left knee with pain on weight bearing.  Past Medical History  Diagnosis Date  . Impotence of organic origin   . Contact dermatitis and other eczema, due to unspecified cause   . Benign neoplasm of colon   . Gout, unspecified   . Other and unspecified hyperlipidemia   . Unspecified essential hypertension   . Diverticulosis of colon (without mention of hemorrhage)   . Basal cell carcinoma of skin, site unspecified   . Extrinsic asthma, unspecified    Past Surgical History  Procedure Laterality Date  . Tonsillectomy     Family History  Problem Relation Age of Onset  . Coronary artery disease Father 35    deceased  . Heart attack Father   . Stroke Mother   . Hypertension Mother   . Diabetes Neg Hx   . Cancer Neg Hx     colon, prostate   History   Social History  . Marital Status: Married    Spouse Name: N/A    Number of Children: 2  . Years of Education: N/A   Occupational History  . retired Pharmacist, hospital    Social History Main Topics  . Smoking status: Current Every Day Smoker  . Smokeless tobacco: Not on file  . Alcohol Use: Yes  . Drug Use: No  . Sexual Activity: Not on file   Other Topics Concern  . Not on file   Social History Narrative   HS[place], [place], [place]; [employer]. Married '62. 2 daughters - '67, '70: no grandchildren. Retired-college  Music therapist. Enjoys retirement: golf, gardening, woodworking. Marriage in good health              Current Outpatient Prescriptions on File Prior to Visit  Medication Sig Dispense Refill  . Cholecalciferol (VITAMIN D) 2000 UNITS CAPS Take 2,000 Units by mouth daily.      . cilostazol (PLETAL) 50 MG tablet Take 50 mg by mouth daily.      . colchicine 0.6 MG tablet Take 0.6 mg by mouth daily as needed (gout).      . fish oil-omega-3 fatty acids 1000 MG capsule Take 1 g by mouth daily.       . hydroxypropyl methylcellulose (ISOPTO TEARS) 2.5 % ophthalmic solution Place 1 drop into both eyes daily as needed for dry eyes.      Marland Kitchen ibuprofen (ADVIL,MOTRIN) 200 MG tablet Take 600 mg by mouth 3 (three) times daily as needed (pain).      Marland Kitchen lisinopril (PRINIVIL,ZESTRIL) 20 MG tablet Take 1 tablet (20 mg total) by mouth daily.  90 tablet  3  . oseltamivir (TAMIFLU) 75 MG capsule Take 1 capsule (75 mg total) by mouth daily.  10 capsule  0   No current facility-administered medications on file prior to visit.  Review of Systems System review is negative for any constitutional, cardiac, pulmonary, GI or neuro symptoms or complaints other than as described in the HPI.     Objective:   Physical Exam Filed Vitals:   04/15/13 1609  BP: 180/86  Pulse: 72  Temp: 96.9 F (36.1 C)   Gen'l- WNWD elderly man in no distress HEENT - no signs of trauma Cor - RRR PUlm - normal MSK - left knee with effusion, swelling, heat, tenderness to palpation. He has good ROM of the knee.       Assessment & Plan:  1.Subarachnoid Hemorrhage - Christopher Rubio is doing fine with no evidence of sequelae. Plan No further w/u needed at this time.  2. Knee pain - post traumatic knee pain with effusion Plan Immediate consult with Dr. Creig Hines

## 2013-04-16 ENCOUNTER — Telehealth: Payer: Self-pay | Admitting: Family Medicine

## 2013-04-16 NOTE — Telephone Encounter (Signed)
Pt is coming in at 11:15a

## 2013-04-16 NOTE — Telephone Encounter (Signed)
The patient is in agony.  He has decided to have the fluid drawn off.  He still has swelling.  I have him scheduled at 4:15.  If there is anyway to work him in sooner, please let him know.

## 2013-04-16 NOTE — Telephone Encounter (Signed)
Please see if he would come in at 1115

## 2013-04-17 ENCOUNTER — Encounter: Payer: Self-pay | Admitting: Internal Medicine

## 2013-04-17 ENCOUNTER — Ambulatory Visit: Payer: Medicare Other | Admitting: Family Medicine

## 2013-04-17 ENCOUNTER — Ambulatory Visit (INDEPENDENT_AMBULATORY_CARE_PROVIDER_SITE_OTHER): Payer: Medicare Other | Admitting: Family Medicine

## 2013-04-17 ENCOUNTER — Encounter: Payer: Self-pay | Admitting: Family Medicine

## 2013-04-17 ENCOUNTER — Other Ambulatory Visit: Payer: Self-pay | Admitting: Internal Medicine

## 2013-04-17 DIAGNOSIS — M25469 Effusion, unspecified knee: Secondary | ICD-10-CM

## 2013-04-17 DIAGNOSIS — M25462 Effusion, left knee: Secondary | ICD-10-CM

## 2013-04-17 DIAGNOSIS — N390 Urinary tract infection, site not specified: Secondary | ICD-10-CM

## 2013-04-17 DIAGNOSIS — I609 Nontraumatic subarachnoid hemorrhage, unspecified: Secondary | ICD-10-CM | POA: Insufficient documentation

## 2013-04-17 MED ORDER — COLCHICINE 0.6 MG PO TABS: 0.6000 mg | ORAL_TABLET | Freq: Every day | ORAL | Status: DC | PRN

## 2013-04-17 MED ORDER — ALLOPURINOL 100 MG PO TABS: 100.0000 mg | ORAL_TABLET | Freq: Every day | ORAL | Status: DC

## 2013-04-17 MED ORDER — CIPROFLOXACIN HCL 500 MG PO TABS: 500.0000 mg | ORAL_TABLET | Freq: Two times a day (BID) | ORAL | Status: DC

## 2013-04-17 NOTE — Assessment & Plan Note (Signed)
The patient's culture did grow out strep acgalactae and sensitivity was not done. I do think that this can be possibly a poor specimen collection. Patient as well as wife is adamant that they felt he didn't need to be treated. Patient was given Cipro which has mild to moderate coverage for gram-positive a will have better penetration for potential prostatitis. Patient will try this medication. We did put in his allergies of penicillin.

## 2013-04-17 NOTE — Assessment & Plan Note (Signed)
Knee pain with swelling  Plan Referral to Dr. Creig Hines, sports medicine.

## 2013-04-17 NOTE — Assessment & Plan Note (Signed)
Patient with observation admission for possible SAH. He did well. Follow up study was reassuring.

## 2013-04-17 NOTE — Patient Instructions (Signed)
I am glad you are better I would do elevation when you can but only if comfortable Start the colchicine twice daily for 2 days then daily for 5 days.  Wear the brace with activity.  Come back if not perfect next week.

## 2013-04-17 NOTE — Assessment & Plan Note (Signed)
Actually feel that patient's original injury was more secondary to gout flare with him improving as quickly. No intervention was necessary today. Patient was given colchicine to finish this flare. Patient warned of potential side effects. Discussed with patient her to take medication titrate off. In addition to this discussed proper diet choices. I believe patient did get somewhat dehydrated likely with the hospitalization which did cause a scalp flare. After diagnosing this patient did state that he had a exacerbation for this multiple years ago on the same knee in states that he thinks about it he does seem similar.

## 2013-04-17 NOTE — Progress Notes (Signed)
   CC: Left knee swelling follow up  HPI: Patient is a very pleasant 80 year old gentleman who unfortunately had a fall causing him to be admitted to the hospital for a subdural hematoma. Patient returns today though for followup of his left knee. Patient did have what appeared to be a large effusion of the knee. Secondary to his phobia of needles he did not want to be draining. Patient was having extreme pain yesterday and made this scheduled appointment have a drain. Patient states though starting yesterday started to feel significantly better and had a lot of drainage. Patient did have significant amount of urination and did feel better. Patient states it is approximately 80% better than he was previously. Still has significant soreness.   Patient also states that he did have a rash after taking an antibiotic I was called in for him, Keflex, for a urinary tract infection was diagnosed in the hospital. Patient states that the rash has resolved but after taking 2 pills he does not feel comfortable taking any more.  Past medical, surgical, family and social history reviewed. Medications reviewed all in the electronic medical record.   Review of Systems: No headache, visual changes, nausea, vomiting, diarrhea, constipation, dizziness, abdominal pain, skin rash, fevers, chills, night sweats, weight loss, swollen lymph nodes, body aches, joint swelling, muscle aches, chest pain, shortness of breath, mood changes.   Objective:    Vitals reviewed   General: No apparent distress alert and oriented x3 mood and affect normal, dressed appropriately.  HEENT: Pupils equal, extraocular movements intact Respiratory: Patient's speak in full sentences and does not appear short of breath Cardiovascular: No lower extremity edema, non tender, no erythema Skin: Warm dry intact with no signs of infection or rash on extremities or on axial skeleton. Abdomen: Soft nontender Neuro: Cranial nerves II through XII are  intact, neurovascularly intact in all extremities with 2+ DTRs and 2+ pulses. Lymph: No lymphadenopathy of posterior or anterior cervical chain or axillae bilaterally.  Gait normal with good balance and coordination.  MSK: Non tender with full range of motion and good stability and symmetric strength and tone of shoulders, elbows, wrist, hip,  and ankles bilaterally.  Knee: Left On inspection p patient likely has significant decreased effusion from previous exam. Still trace remaining. Palpation normal with no warmth, joint line tenderness, patellar tenderness, or condyle tenderness. Patient does have decreased range of motion with flexion to only 100 and full extension. This is better than previous exam Ligaments with solid consistent endpoints including ACL, PCL, LCL, MCL. Negative Mcmurray's, Apley's, and Thessalonian tests. Mild painful patellar compression. Patellar glide with mild crepitus. Patellar and quadriceps tendons unremarkable. Hamstring and quadriceps strength is normal.  Contralateral knee unremarkable with some mild atrophy of the quadriceps noted.     Impression and Recommendations:     This case required medical decision making of moderate complexity.

## 2013-04-22 ENCOUNTER — Ambulatory Visit: Payer: Medicare Other | Admitting: Family Medicine

## 2013-04-22 ENCOUNTER — Telehealth: Payer: Self-pay | Admitting: Internal Medicine

## 2013-04-22 NOTE — Telephone Encounter (Signed)
Ok to add on

## 2013-04-22 NOTE — Telephone Encounter (Signed)
Pt called request to be work in with Dr. Linda Hedges today. Pt stated that he is in pain and burning because his skin is peeling off of his body and pt can not get in to see the dermatologist any time soon. Pt refuse to go to ER.  Please advise.

## 2013-04-22 NOTE — Telephone Encounter (Signed)
Pt stated does not need an appt, problem solved.

## 2013-07-15 ENCOUNTER — Telehealth: Payer: Self-pay | Admitting: Internal Medicine

## 2013-07-15 ENCOUNTER — Encounter: Payer: Self-pay | Admitting: Internal Medicine

## 2013-07-15 ENCOUNTER — Ambulatory Visit (INDEPENDENT_AMBULATORY_CARE_PROVIDER_SITE_OTHER): Payer: Medicare Other | Admitting: Internal Medicine

## 2013-07-15 VITALS — BP 148/82 | HR 56 | Temp 97.4°F | Resp 14 | Wt 151.4 lb

## 2013-07-15 DIAGNOSIS — M542 Cervicalgia: Secondary | ICD-10-CM

## 2013-07-15 DIAGNOSIS — D539 Nutritional anemia, unspecified: Secondary | ICD-10-CM | POA: Insufficient documentation

## 2013-07-15 DIAGNOSIS — D649 Anemia, unspecified: Secondary | ICD-10-CM

## 2013-07-15 DIAGNOSIS — R634 Abnormal weight loss: Secondary | ICD-10-CM

## 2013-07-15 DIAGNOSIS — I739 Peripheral vascular disease, unspecified: Secondary | ICD-10-CM

## 2013-07-15 NOTE — Telephone Encounter (Signed)
Relevant patient education assigned to patient using Emmi. ° °

## 2013-07-15 NOTE — Progress Notes (Signed)
Pre visit review using our clinic review tool, if applicable. No additional management support is needed unless otherwise documented below in the visit note. 

## 2013-07-15 NOTE — Patient Instructions (Signed)
Use an anti-inflammatory cream such as Aspercreme or Zostrix cream twice a day to the affected area as needed. In lieu of this warm moist compresses or  hot water bottle can be used. Do not apply ice .  If the  symptoms persist or progress;  I recommend a Physical Therapy consultation for further evaluation. Please think about quitting smoking. Review the risks we discussed. Please call 1-800-QUIT-NOW [phone]) for free smoking cessation counseling.

## 2013-07-15 NOTE — Progress Notes (Signed)
Subjective:    Patient ID: Christopher Aid., male    DOB: [birth year], 80 y.o.   MRN: [id number]  HPI  He began to have pain in his neck on the left side 7 days ago without specific trigger or injury. It occurred when he turned his head laterally.  Initially this progressed but apparently responded to nonsteroidals and wearing a neck brace which belonged to his wife as it has improved in the last 3 days.  It is not associated with numbness, tingling, or weakness in his upper extremities.  He's had no incontinence of urine or stool.  There's been no change in color, temperature, or appearance of any rash in the area of the pain.  His most recent labs from December 2014 in January 2015 were reviewed. There was mild anemia present. There was mild decrease in the GFR. TSH was therapeutic.  He inquired as to whether the urinalysis need to be repeated as he was treated for urinary tract infection based on culture from the hospital. He stated that Dr. Linda Hedges did prescribe medication for this. He is asymptomatic with no pyuria, or hematuria.  He continues to smoke and has no interest in discontinuing this. Cardiac and vascular risk were discussed.      Review of Systems He has lost approximately 14 pounds in the last 2 years which was not purposeful.  He denies abdominal pain, significant dyspepsia, dysphagia, melena, rectal bleeding, or persistently small caliber stools.  He denies any cough, sputum production, hemoptysis, or significant dyspnea.  He does have discomfort from the buttocks down after mowing a short period time.  He denies chest pain, palpitations, bruxism nocturnal dyspnea, or edema.      Objective:   Physical Exam Gen.: Healthy and well-nourished in appearance. Alert, appropriate and cooperative throughout exam. Appears younger than stated age  Head: Normocephalic without obvious abnormalities;  No alopecia  Eyes: No corneal or conjunctival inflammation  noted. Pupils equal round reactive to light and accommodation. Extraocular motion intact. Slight ptosis right eye  Ears: External  ear exam reveals no significant lesions or deformities. Canals clear .TMs normal. Nose: External nasal exam reveals no deformity or inflammation. Nasal mucosa are pink and moist. No lesions or exudates noted.   Mouth: Oral mucosa and oropharynx reveal no lesions or exudates. Teeth in good repair. Neck: No deformities, masses, or tenderness noted. Decreased range of motion with left lateral rotation. Thyroid normal  Lungs: Normal respiratory effort; chest expands symmetrically. Lungs are clear to auscultation without rales, wheezes, or increased work of breathing. Breath sounds generally decreased. Heart: Normal rate and rhythm. Normal S1 and S2. No gallop, click, or rub. Grade 1 systolic murmur Abdomen: Bowel sounds normal; abdomen soft and nontender. No masses, organomegaly or hernias noted.                              Musculoskeletal/extremities: No deformity or scoliosis noted of  the thoracic or lumbar spine.    No clubbing, cyanosis, edema, or significant extremity  deformity noted. Range of motion normal .Tone & strength normal. Hand joints normal  Fingernail  health good. Able to lie down & sit up w/o help. Negative SLR bilaterally Vascular: Carotid, radial artery, dorsalis pedis and  posterior tibial pulses are equal. Decreased pedal pulses.No bruits present. Neurologic: Alert and oriented x3. Deep tendon reflexes symmetrical and normal.  Gait normal  including heel & toe walking .  Skin:  Intact without suspicious lesions or rashes. Lymph: No cervical, axillary lymphadenopathy present. Psych: Mood and affect are normal. Normally interactive                                                                                        Assessment & Plan:  #1 cervical neck pain, musculoskeletal. No neuromuscular deficit  #2 significant weight loss without  localizing signs  #3 claudication in the context of one half pack per day smoking and prior history of peripheral vascular disease.  #4 anemia  See orders. Note: CBC & dif & IBC declined. "I have needle phobia".  After the above issues were discussed; he produced a list of other concerns which he wished to review. I explained that he had been worked in for an acute visit and that time did not allow a complete physical type encounter. At that point he made the comment that "this is the business" & left.

## 2013-07-30 ENCOUNTER — Telehealth: Payer: Self-pay | Admitting: Internal Medicine

## 2013-07-30 NOTE — Telephone Encounter (Signed)
Ok with me 

## 2013-07-30 NOTE — Telephone Encounter (Signed)
Patient and his wife are Dr. Linda Hedges patients.  They heard that you had a wait list but we do not have a list.  Right now Mr. Christopher Rubio and his wife are on the wait list for Dr. Doug Sou but they would like to know if you would be able to take them on.

## 2013-08-09 LAB — HM COLONOSCOPY

## 2013-10-21 ENCOUNTER — Other Ambulatory Visit: Payer: Self-pay

## 2013-10-21 MED ORDER — LISINOPRIL 20 MG PO TABS: 20.0000 mg | ORAL_TABLET | Freq: Every day | ORAL | Status: DC

## 2013-12-09 ENCOUNTER — Encounter: Payer: Self-pay | Admitting: Internal Medicine

## 2013-12-09 ENCOUNTER — Ambulatory Visit (INDEPENDENT_AMBULATORY_CARE_PROVIDER_SITE_OTHER): Payer: Medicare Other | Admitting: Internal Medicine

## 2013-12-09 ENCOUNTER — Other Ambulatory Visit (INDEPENDENT_AMBULATORY_CARE_PROVIDER_SITE_OTHER): Payer: Medicare Other

## 2013-12-09 VITALS — BP 130/78 | HR 60 | Temp 97.8°F | Resp 16 | Ht 67.0 in | Wt 149.0 lb

## 2013-12-09 DIAGNOSIS — E785 Hyperlipidemia, unspecified: Secondary | ICD-10-CM

## 2013-12-09 DIAGNOSIS — D539 Nutritional anemia, unspecified: Secondary | ICD-10-CM

## 2013-12-09 DIAGNOSIS — I10 Essential (primary) hypertension: Secondary | ICD-10-CM

## 2013-12-09 DIAGNOSIS — Z0000 Encounter for general adult medical examination without abnormal findings: Secondary | ICD-10-CM

## 2013-12-09 DIAGNOSIS — M109 Gout, unspecified: Secondary | ICD-10-CM

## 2013-12-09 LAB — RETICULOCYTES
ABS RETIC: 54.1 10*3/uL (ref 19.0–186.0)
RBC.: 4.51 MIL/uL (ref 4.22–5.81)
Retic Ct Pct: 1.2 % (ref 0.4–2.3)

## 2013-12-09 LAB — CBC WITH DIFFERENTIAL/PLATELET
Basophils Absolute: 0.1 10*3/uL (ref 0.0–0.1)
Basophils Relative: 1.3 % (ref 0.0–3.0)
EOS PCT: 3.8 % (ref 0.0–5.0)
Eosinophils Absolute: 0.4 10*3/uL (ref 0.0–0.7)
HCT: 40.6 % (ref 39.0–52.0)
Hemoglobin: 13.8 g/dL (ref 13.0–17.0)
LYMPHS ABS: 2.4 10*3/uL (ref 0.7–4.0)
Lymphocytes Relative: 24 % (ref 12.0–46.0)
MCHC: 33.9 g/dL (ref 30.0–36.0)
MCV: 90.8 fl (ref 78.0–100.0)
MONO ABS: 1 10*3/uL (ref 0.1–1.0)
Monocytes Relative: 10.1 % (ref 3.0–12.0)
Neutro Abs: 6 10*3/uL (ref 1.4–7.7)
Neutrophils Relative %: 60.8 % (ref 43.0–77.0)
PLATELETS: 232 10*3/uL (ref 150.0–400.0)
RBC: 4.47 Mil/uL (ref 4.22–5.81)
RDW: 14.3 % (ref 11.5–15.5)
WBC: 9.8 10*3/uL (ref 4.0–10.5)

## 2013-12-09 LAB — COMPREHENSIVE METABOLIC PANEL
ALT: 21 U/L (ref 0–53)
AST: 19 U/L (ref 0–37)
Albumin: 4.2 g/dL (ref 3.5–5.2)
Alkaline Phosphatase: 72 U/L (ref 39–117)
BUN: 35 mg/dL — ABNORMAL HIGH (ref 6–23)
CO2: 26 mEq/L (ref 19–32)
Calcium: 9.5 mg/dL (ref 8.4–10.5)
Chloride: 104 mEq/L (ref 96–112)
Creatinine, Ser: 1.6 mg/dL — ABNORMAL HIGH (ref 0.4–1.5)
GFR: 46.34 mL/min — ABNORMAL LOW (ref >60.00)
Glucose, Bld: 97 mg/dL (ref 70–99)
Potassium: 4.7 mEq/L (ref 3.5–5.1)
SODIUM: 141 meq/L (ref 135–145)
TOTAL PROTEIN: 7.9 g/dL (ref 6.0–8.3)
Total Bilirubin: 0.8 mg/dL (ref 0.2–1.2)

## 2013-12-09 LAB — IBC PANEL
Iron: 85 ug/dL (ref 42–165)
SATURATION RATIOS: 22.2 % (ref 20.0–50.0)
Transferrin: 273.4 mg/dL (ref 212.0–360.0)

## 2013-12-09 LAB — LIPID PANEL
Cholesterol: 219 mg/dL — ABNORMAL HIGH (ref 0–200)
HDL: 29.4 mg/dL — AB (ref >39.00)
NonHDL: 189.6
Total CHOL/HDL Ratio: 7
Triglycerides: 404 mg/dL — ABNORMAL HIGH (ref 0.0–149.0)
VLDL: 80.8 mg/dL — ABNORMAL HIGH (ref 0.0–40.0)

## 2013-12-09 LAB — VITAMIN B12: VITAMIN B 12: 478 pg/mL (ref 211–911)

## 2013-12-09 LAB — LDL CHOLESTEROL, DIRECT: Direct LDL: 131.4 mg/dL

## 2013-12-09 LAB — FECAL OCCULT BLOOD, GUAIAC: Fecal Occult Blood: NEGATIVE

## 2013-12-09 LAB — TSH: TSH: 2.39 u[IU]/mL (ref 0.35–4.50)

## 2013-12-09 LAB — FOLATE: Folate: 10 ng/mL (ref >5.9)

## 2013-12-09 LAB — FERRITIN: Ferritin: 211.7 ng/mL (ref 22.0–322.0)

## 2013-12-09 NOTE — Patient Instructions (Signed)
Anemia, Nonspecific Anemia is a condition in which the concentration of red blood cells or hemoglobin in the blood is below normal. Hemoglobin is a substance in red blood cells that carries oxygen to the tissues of the body. Anemia results in not enough oxygen reaching these tissues.  CAUSES  Common causes of anemia include:   Excessive bleeding. Bleeding may be internal or external. This includes excessive bleeding from periods (in women) or from the intestine.   Poor nutrition.   Chronic kidney, thyroid, and liver disease.  Bone marrow disorders that decrease red blood cell production.  Cancer and treatments for cancer.  HIV, AIDS, and their treatments.  Spleen problems that increase red blood cell destruction.  Blood disorders.  Excess destruction of red blood cells due to infection, medicines, and autoimmune disorders. SIGNS AND SYMPTOMS   Minor weakness.   Dizziness.   Headache.  Palpitations.   Shortness of breath, especially with exercise.   Paleness.  Cold sensitivity.  Indigestion.  Nausea.  Difficulty sleeping.  Difficulty concentrating. Symptoms may occur suddenly or they may develop slowly.  DIAGNOSIS  Additional blood tests are often needed. These help your health care provider determine the best treatment. Your health care provider will check your stool for blood and look for other causes of blood loss.  TREATMENT  Treatment varies depending on the cause of the anemia. Treatment can include:   Supplements of iron, vitamin B12, or folic acid.   Hormone medicines.   A blood transfusion. This may be needed if blood loss is severe.   Hospitalization. This may be needed if there is significant continual blood loss.   Dietary changes.  Spleen removal. HOME CARE INSTRUCTIONS Keep all follow-up appointments. It often takes many weeks to correct anemia, and having your health care provider check on your condition and your response to  treatment is very important. SEEK IMMEDIATE MEDICAL CARE IF:   You develop extreme weakness, shortness of breath, or chest pain.   You become dizzy or have trouble concentrating.  You develop heavy vaginal bleeding.   You develop a rash.   You have bloody or black, tarry stools.   You faint.   You vomit up blood.   You vomit repeatedly.   You have abdominal pain.  You have a fever or persistent symptoms for more than 2-3 days.   You have a fever and your symptoms suddenly get worse.   You are dehydrated.  MAKE SURE YOU:  Understand these instructions.  Will watch your condition.  Will get help right away if you are not doing well or get worse. Document Released: 05/05/2004 Document Revised: 11/28/2012 Document Reviewed: 09/21/2012 [employer] Patient Information 2015 [employer]. This information is not intended to replace advice given to you by your health care provider. Make sure you discuss any questions you have with your health care provider.  

## 2013-12-09 NOTE — Progress Notes (Signed)
Pre visit review using our clinic review tool, if applicable. No additional management support is needed unless otherwise documented below in the visit note. 

## 2013-12-09 NOTE — Progress Notes (Signed)
Subjective:    Patient ID: Christopher Aid., male    DOB: 1933/09/02, 80 y.o.   MRN: [id number]  Anemia Presents for follow-up visit. There has been no abdominal pain, anorexia, bruising/bleeding easily, confusion, fever, leg swelling, light-headedness, malaise/fatigue, pallor, palpitations, paresthesias, pica or weight loss. Signs of blood loss that are present include melena (takes iron). Signs of blood loss that are not present include hematemesis, hematochezia and vaginal bleeding. Past treatments include oral iron supplements. Past medical history includes alcohol abuse, recent illness and recent surgery. There are no compliance problems.       Review of Systems  Constitutional: Negative.  Negative for fever, chills, weight loss, malaise/fatigue, diaphoresis, appetite change and fatigue.  HENT: Negative.   Eyes: Negative.   Respiratory: Negative.  Negative for cough, choking, chest tightness, shortness of breath, wheezing and stridor.   Cardiovascular: Negative.  Negative for chest pain, palpitations and leg swelling.  Gastrointestinal: Positive for melena (takes iron). Negative for nausea, vomiting, abdominal pain, diarrhea, constipation, blood in stool, hematochezia, anorexia and hematemesis.  Endocrine: Negative.   Genitourinary: Negative.  Negative for dysuria, urgency, hematuria, discharge, scrotal swelling, vaginal bleeding, difficulty urinating, penile pain and testicular pain.  Musculoskeletal: Negative.  Negative for arthralgias, back pain, myalgias and neck pain.  Skin: Negative.  Negative for pallor.  Allergic/Immunologic: Negative.   Neurological: Negative.  Negative for dizziness, light-headedness and paresthesias.  Hematological: Negative.  Negative for adenopathy. Does not bruise/bleed easily.  Psychiatric/Behavioral: Negative.  Negative for confusion.       Objective:   Physical Exam  Vitals reviewed. Constitutional: He is oriented to person, place, and  time. He appears well-developed and well-nourished. No distress.  HENT:  Head: Normocephalic and atraumatic.  Mouth/Throat: Oropharynx is clear and moist. No oropharyngeal exudate.  Eyes: Conjunctivae are normal. Right eye exhibits no discharge. Left eye exhibits no discharge. No scleral icterus.  Neck: Normal range of motion. Neck supple. No JVD present. No tracheal deviation present. No thyromegaly present.  Cardiovascular: Normal rate, regular rhythm, S1 normal, S2 normal and intact distal pulses.  Exam reveals no gallop.   Murmur heard.  Decrescendo systolic murmur is present with a grade of 1/6   No diastolic murmur is present  Pulses:      Carotid pulses are 1+ on the right side, and 1+ on the left side.      Radial pulses are 1+ on the right side, and 1+ on the left side.       Femoral pulses are 1+ on the right side, and 1+ on the left side.      Popliteal pulses are 1+ on the right side, and 1+ on the left side.       Dorsalis pedis pulses are 1+ on the right side, and 1+ on the left side.       Posterior tibial pulses are 1+ on the right side, and 1+ on the left side.  Pulmonary/Chest: Effort normal and breath sounds normal. No stridor. No respiratory distress. He has no wheezes. He has no rales. He exhibits no tenderness.  Abdominal: Soft. Bowel sounds are normal. He exhibits no distension and no mass. There is no tenderness. There is no rebound and no guarding. Hernia confirmed negative in the right inguinal area and confirmed negative in the left inguinal area.  Genitourinary: Testes normal and penis normal. Rectal exam shows external hemorrhoid. Rectal exam shows no internal hemorrhoid, no fissure, no mass, no tenderness and anal tone normal. Guaiac  negative stool. Prostate is enlarged (2+ smooth symm BPH). Prostate is not tender. Right testis shows no mass, no swelling and no tenderness. Right testis is descended. Left testis shows no mass, no swelling and no tenderness. Left testis  is descended. Circumcised. No penile erythema or penile tenderness. No discharge found.  Musculoskeletal: Normal range of motion. He exhibits no edema and no tenderness.  Lymphadenopathy:    He has no cervical adenopathy.       Right: No inguinal adenopathy present.       Left: No inguinal adenopathy present.  Neurological: He is alert and oriented to person, place, and time. He has normal reflexes. He displays normal reflexes. No cranial nerve deficit. He exhibits normal muscle tone. Coordination normal.  Skin: Skin is warm and dry. No rash noted. He is not diaphoretic. No erythema. No pallor.  Psychiatric: He has a normal mood and affect. His behavior is normal. Judgment and thought content normal.     Lab Results  Component Value Date   WBC 11.5* 04/08/2013   HGB 12.8* 04/08/2013   HCT 37.4* 04/08/2013   PLT 270 04/08/2013   GLUCOSE 95 04/12/2013   CHOL 242* 10/15/2012   TRIG 225.0* 10/15/2012   HDL 35.10* 10/15/2012   LDLDIRECT 157.8 10/15/2012   ALT 15 03/31/2013   AST 16 03/31/2013   NA 140 04/12/2013   K 4.9 04/12/2013   CL 106 04/12/2013   CREATININE 1.3 04/12/2013   BUN 19 04/12/2013   CO2 27 04/12/2013   TSH 2.023 04/07/2013        Assessment & Plan:

## 2013-12-10 ENCOUNTER — Encounter: Payer: Self-pay | Admitting: Internal Medicine

## 2013-12-10 NOTE — Assessment & Plan Note (Signed)
He will not take a statin 

## 2013-12-10 NOTE — Assessment & Plan Note (Addendum)

## 2013-12-10 NOTE — Assessment & Plan Note (Signed)
His BP is well controlled Lytes and renal function are stable 

## 2013-12-10 NOTE — Assessment & Plan Note (Signed)
The melena is cause by the iron therapy The anemia has resolved and the vitamin levels are normal

## 2013-12-17 ENCOUNTER — Telehealth: Payer: Self-pay | Admitting: *Deleted

## 2013-12-17 NOTE — Telephone Encounter (Signed)
Pt states he received results from blood work that was done on 12/09/13 stating that his kidney functions was elevated but md didn't say what he need to do. Also on his cholesterol he saw his results was also elevated and wasn't given any advisement. Ask pt was he taking any cholesterol medication pt stated he use to take crestor several years ago but it gave him severe body ache so he stop taking. Pls advise on what to do concerning labs...Christopher Rubio

## 2013-12-18 ENCOUNTER — Other Ambulatory Visit: Payer: Self-pay | Admitting: Internal Medicine

## 2013-12-18 DIAGNOSIS — E785 Hyperlipidemia, unspecified: Secondary | ICD-10-CM

## 2013-12-18 MED ORDER — EZETIMIBE 10 MG PO TABS: 10.0000 mg | ORAL_TABLET | Freq: Every day | ORAL | Status: DC

## 2013-12-18 NOTE — Telephone Encounter (Signed)
Notified pt with md response. Made appt for f/u 12/30/13.../lmb 

## 2013-12-18 NOTE — Telephone Encounter (Signed)
I will restart a cholesterol med Ask him to avoid antiinflammatories and other meds that can damage the kidneys - he can check with his pharmacist and return to recheck the kidney function in 2-3 weeks.  Alona Bene

## 2013-12-30 ENCOUNTER — Encounter: Payer: Self-pay | Admitting: Internal Medicine

## 2013-12-30 ENCOUNTER — Ambulatory Visit (INDEPENDENT_AMBULATORY_CARE_PROVIDER_SITE_OTHER): Payer: Medicare Other | Admitting: Internal Medicine

## 2013-12-30 VITALS — BP 130/80 | HR 58 | Temp 98.0°F | Resp 16 | Ht 67.0 in | Wt 153.0 lb

## 2013-12-30 DIAGNOSIS — E785 Hyperlipidemia, unspecified: Secondary | ICD-10-CM

## 2013-12-30 DIAGNOSIS — N183 Chronic kidney disease, stage 3 unspecified: Secondary | ICD-10-CM | POA: Insufficient documentation

## 2013-12-30 DIAGNOSIS — Z23 Encounter for immunization: Secondary | ICD-10-CM

## 2013-12-30 DIAGNOSIS — I10 Essential (primary) hypertension: Secondary | ICD-10-CM

## 2013-12-30 DIAGNOSIS — N1832 Chronic kidney disease, stage 3b: Secondary | ICD-10-CM | POA: Insufficient documentation

## 2013-12-30 NOTE — Assessment & Plan Note (Signed)
He is doing well on zetia He will cont the fish oils and will work on his lifestyle modifications to lower his trigs

## 2013-12-30 NOTE — Progress Notes (Signed)
Subjective:    Patient ID: Christopher Rubio., male    DOB: [birth year], 80 y.o.   MRN: [id number]  Hypertension This is a chronic problem. The current episode started more than 1 year ago. The problem is unchanged. The problem is controlled. Pertinent negatives include no anxiety, blurred vision, chest pain, headaches, malaise/fatigue, neck pain, orthopnea, palpitations, peripheral edema, PND, shortness of breath or sweats. Agents associated with hypertension include NSAIDs. Past treatments include ACE inhibitors. There are no compliance problems.  Hypertensive end-organ damage includes kidney disease, CVA and PVD. Identifiable causes of hypertension include chronic renal disease.      Review of Systems  Constitutional: Negative.  Negative for fever, chills, malaise/fatigue, diaphoresis, appetite change and fatigue.  HENT: Negative.   Eyes: Negative.  Negative for blurred vision.  Respiratory: Negative.  Negative for cough, choking, chest tightness, shortness of breath and stridor.   Cardiovascular: Negative.  Negative for chest pain, palpitations, orthopnea, leg swelling and PND.  Gastrointestinal: Negative.  Negative for nausea, vomiting, abdominal pain, diarrhea, constipation and blood in stool.  Endocrine: Negative.   Genitourinary: Negative.   Musculoskeletal: Negative.  Negative for arthralgias, back pain, myalgias and neck pain.  Skin: Negative.  Negative for rash.  Allergic/Immunologic: Negative.   Neurological: Negative.  Negative for dizziness, syncope, speech difficulty, weakness, light-headedness and headaches.  Hematological: Negative.  Negative for adenopathy. Does not bruise/bleed easily.  Psychiatric/Behavioral: Negative.        Objective:   Physical Exam  Vitals reviewed. Constitutional: He is oriented to person, place, and time. He appears well-developed and well-nourished. No distress.  HENT:  Head: Normocephalic and atraumatic.  Mouth/Throat: Oropharynx is  clear and moist. No oropharyngeal exudate.  Eyes: Conjunctivae are normal. Right eye exhibits no discharge. Left eye exhibits no discharge. No scleral icterus.  Neck: Normal range of motion. Neck supple. No JVD present. No tracheal deviation present. No thyromegaly present.  Cardiovascular: Normal rate, regular rhythm, S1 normal, S2 normal and intact distal pulses.  Exam reveals no gallop and no friction rub.   Murmur heard.  Decrescendo systolic murmur is present with a grade of 1/6   No diastolic murmur is present  Pulmonary/Chest: Effort normal and breath sounds normal. No stridor. No respiratory distress. He has no wheezes. He has no rales. He exhibits no tenderness.  Abdominal: Soft. Bowel sounds are normal. He exhibits no distension and no mass. There is no tenderness. There is no rebound and no guarding.  Musculoskeletal: Normal range of motion. He exhibits no edema and no tenderness.  Lymphadenopathy:    He has no cervical adenopathy.  Neurological: He is oriented to person, place, and time.  Skin: Skin is warm and dry. No rash noted. He is not diaphoretic. No erythema. No pallor.  Psychiatric: He has a normal mood and affect. His behavior is normal. Judgment and thought content normal.     Lab Results  Component Value Date   WBC 9.8 12/09/2013   HGB 13.8 12/09/2013   HCT 40.6 12/09/2013   PLT 232.0 12/09/2013   GLUCOSE 97 12/09/2013   CHOL 219* 12/09/2013   TRIG 404.0* 12/09/2013   HDL 29.40* 12/09/2013   LDLDIRECT 131.4 12/09/2013   ALT 21 12/09/2013   AST 19 12/09/2013   NA 141 12/09/2013   K 4.7 12/09/2013   CL 104 12/09/2013   CREATININE 1.6* 12/09/2013   BUN 35* 12/09/2013   CO2 26 12/09/2013   TSH 2.39 12/09/2013       Assessment &  Plan:

## 2013-12-30 NOTE — Progress Notes (Signed)
Pre visit review using our clinic review tool, if applicable. No additional management support is needed unless otherwise documented below in the visit note. 

## 2013-12-30 NOTE — Assessment & Plan Note (Signed)
His BP is well controlled Lytes are stable

## 2013-12-30 NOTE — Patient Instructions (Signed)

## 2013-12-30 NOTE — Assessment & Plan Note (Signed)
Will cont the ACEI I have asked him to avoid nsaids Will keep tight BP control

## 2014-01-17 ENCOUNTER — Other Ambulatory Visit: Payer: Self-pay

## 2014-01-17 MED ORDER — COLCHICINE 0.6 MG PO TABS: 0.6000 mg | ORAL_TABLET | Freq: Every day | ORAL | Status: DC | PRN

## 2014-07-02 IMAGING — CT CT HEAD W/O CM
1 of 2 series · 15 of 30 positions shown, 19 images · non-contrast
Comparison: None.

CLINICAL DATA: Syncope post trauma

EXAM:
CT HEAD WITHOUT CONTRAST
TECHNIQUE: Contiguous axial images were obtained from the base of the skull
through the vertex without intravenous contrast.

[Series 3: head 2.0 h70h · axial · 0.42mm/px · z∈[-155,-11]mm · 15 of 80 slices shown, 19 images]
[im 4/80  brain]
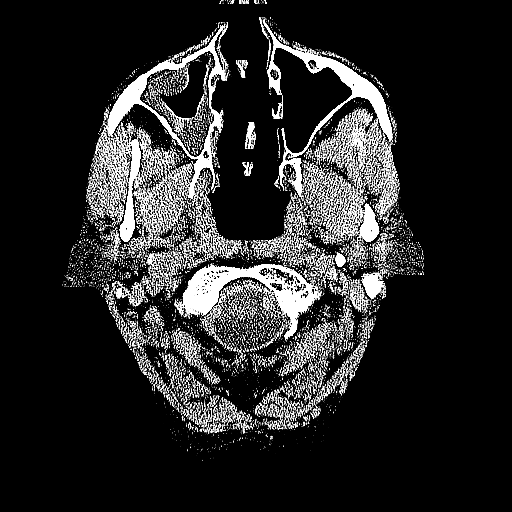
[im 4/80  bone]
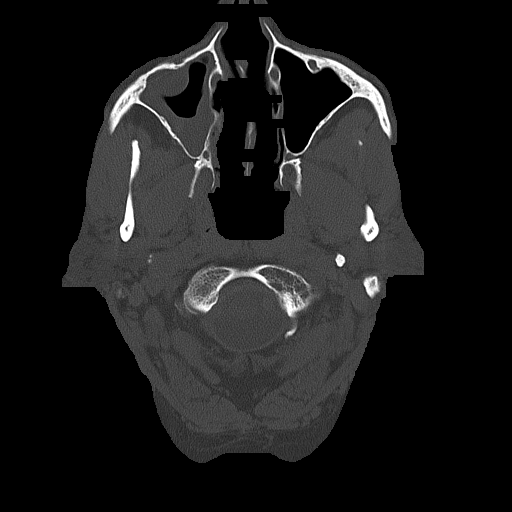
[im 8/80  brain]
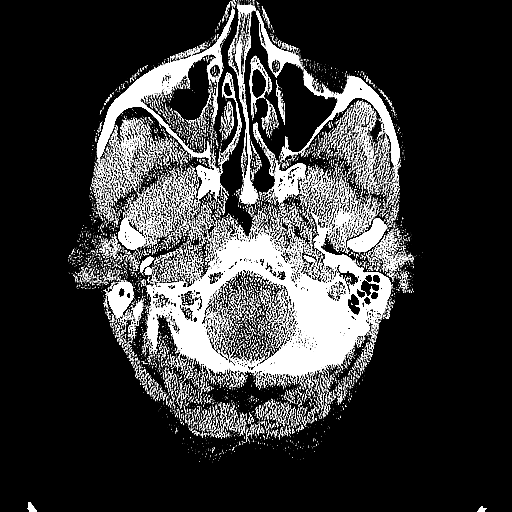
[im 16/80  brain]
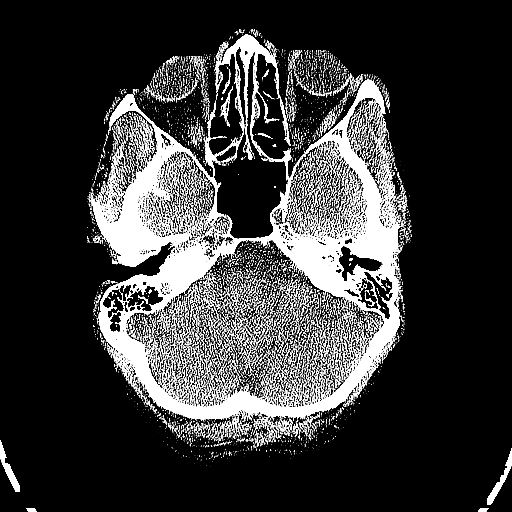
[im 20/80  brain]
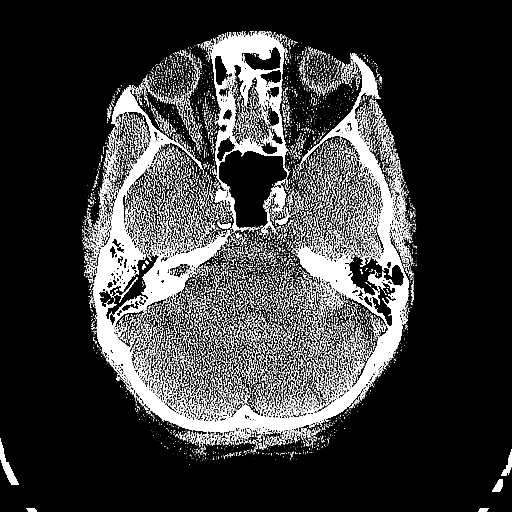
[im 24/80  brain]
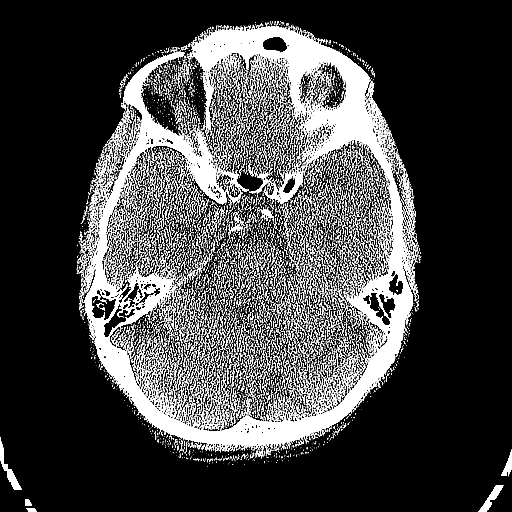
[im 24/80  bone]
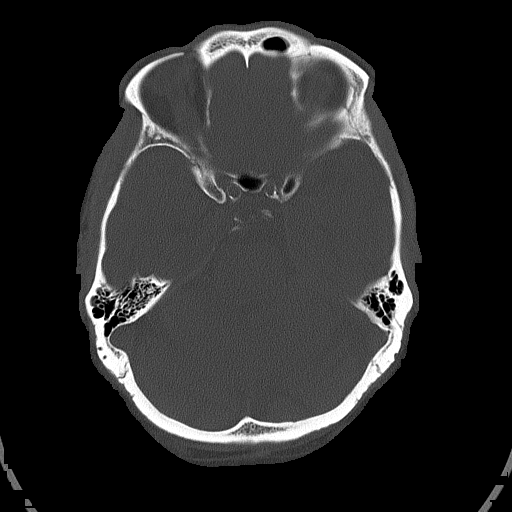
[im 28/80  brain]
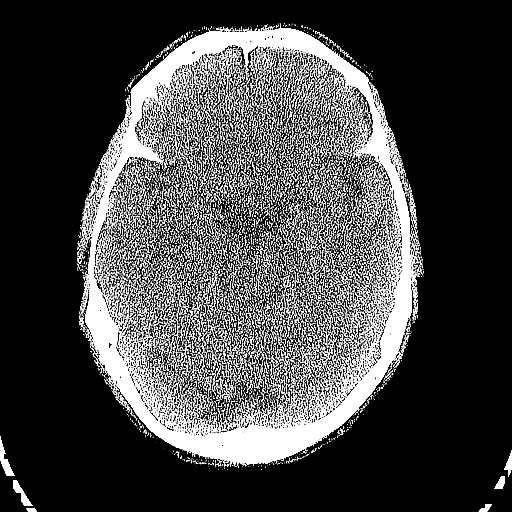
[im 36/80  brain]
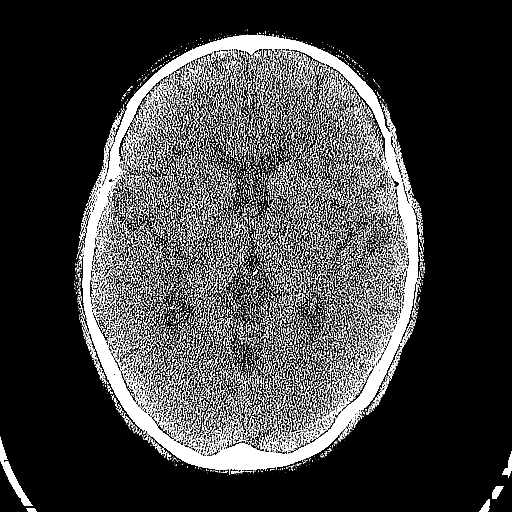
[im 40/80  brain]
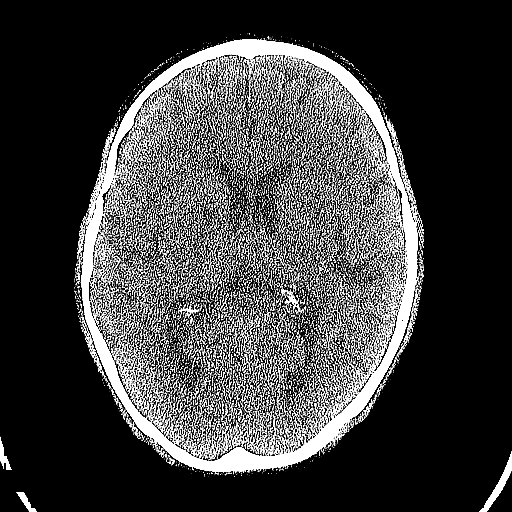
[im 44/80  brain]
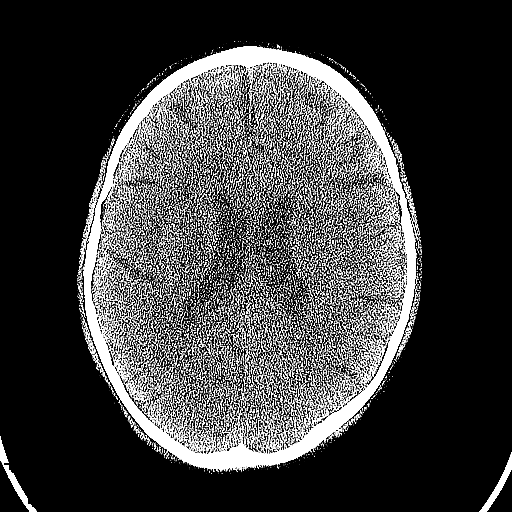
[im 44/80  bone]
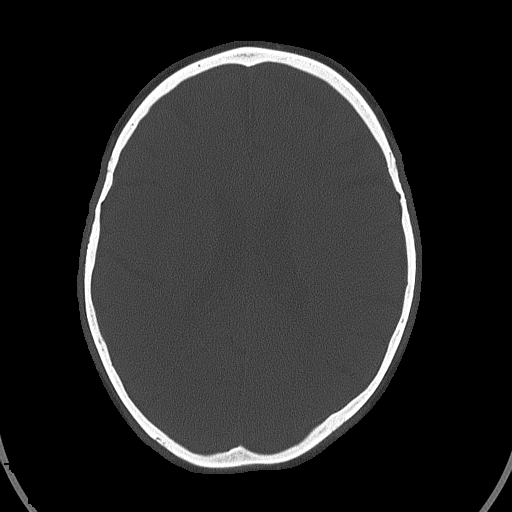
[im 52/80  brain]
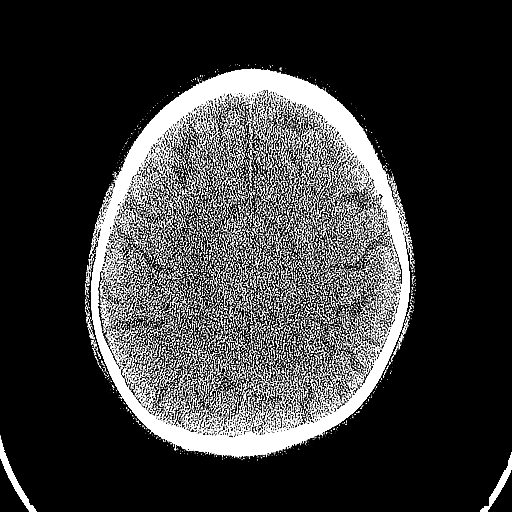
[im 56/80  brain]
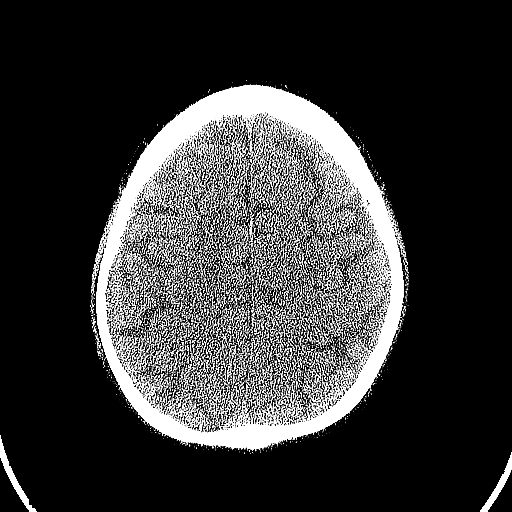
[im 60/80  brain]
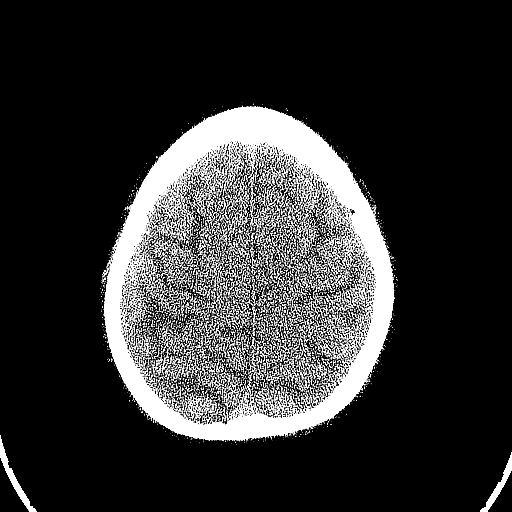
[im 64/80  brain]
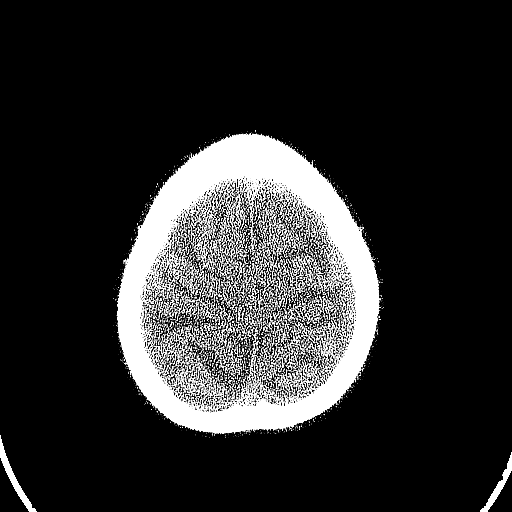
[im 64/80  bone]
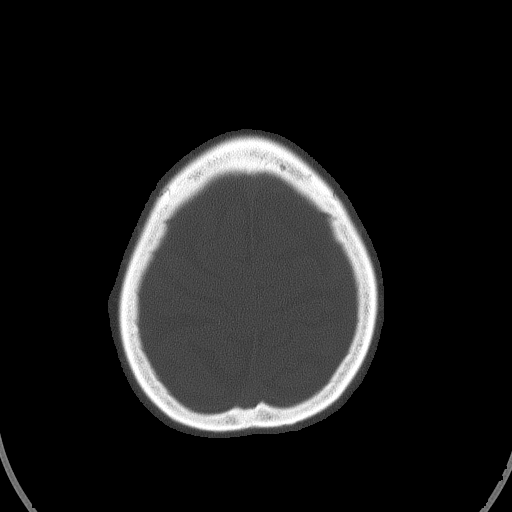
[im 72/80  brain]
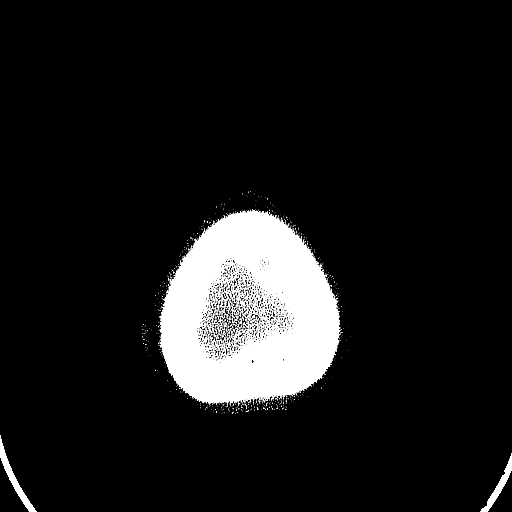
[im 76/80  brain]
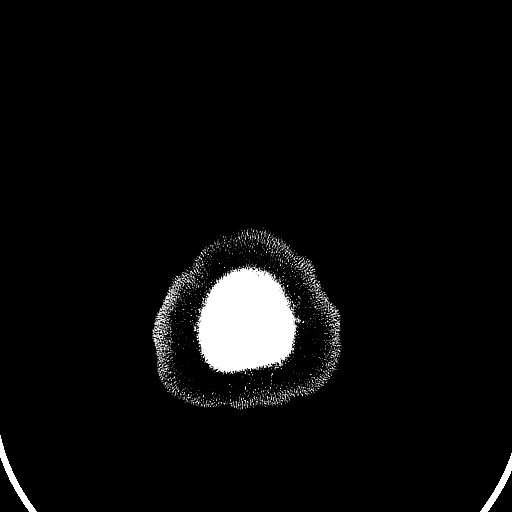

[15 of 30 positions shown; findings below may reference images not displayed]

FINDINGS: There is mild diffuse atrophy. There is a small amount of
subarachnoid hemorrhage in the right frontal lobe, best seen on
axial slice 13. The no other focal hemorrhage is seen. There is no
mass, extra-axial fluid collection, or midline shift. There is
patchy small vessel disease in the centra semiovale bilaterally. No
acute infarct is apparent.

Bony calvarium appears intact. The mastoid air cells are clear.
There is moderate mucosal thickening in the right maxillary antrum.
There is mild ethmoid air cell disease bilaterally.
IMPRESSION: Small amount of subarachnoid hemorrhage in the right frontal lobe.
There is atrophy with periventricular small vessel disease. No mass.
No subdural or epidural fluid. Paranasal sinus disease as noted
above.

Critical Value/emergent results were called by telephone at the time
of interpretation on 04/07/2013 at [DATE] to Dr. SB , who
verbally acknowledged these results.

## 2014-07-02 IMAGING — CR DG KNEE COMPLETE 4+V*L*
4 series · 4 of 4 positions shown · non-contrast
Comparison: None.

CLINICAL DATA: Fall with laceration and pain.

EXAM:
LEFT KNEE - COMPLETE 4+ VIEW

[view not recorded (1 of 4)]
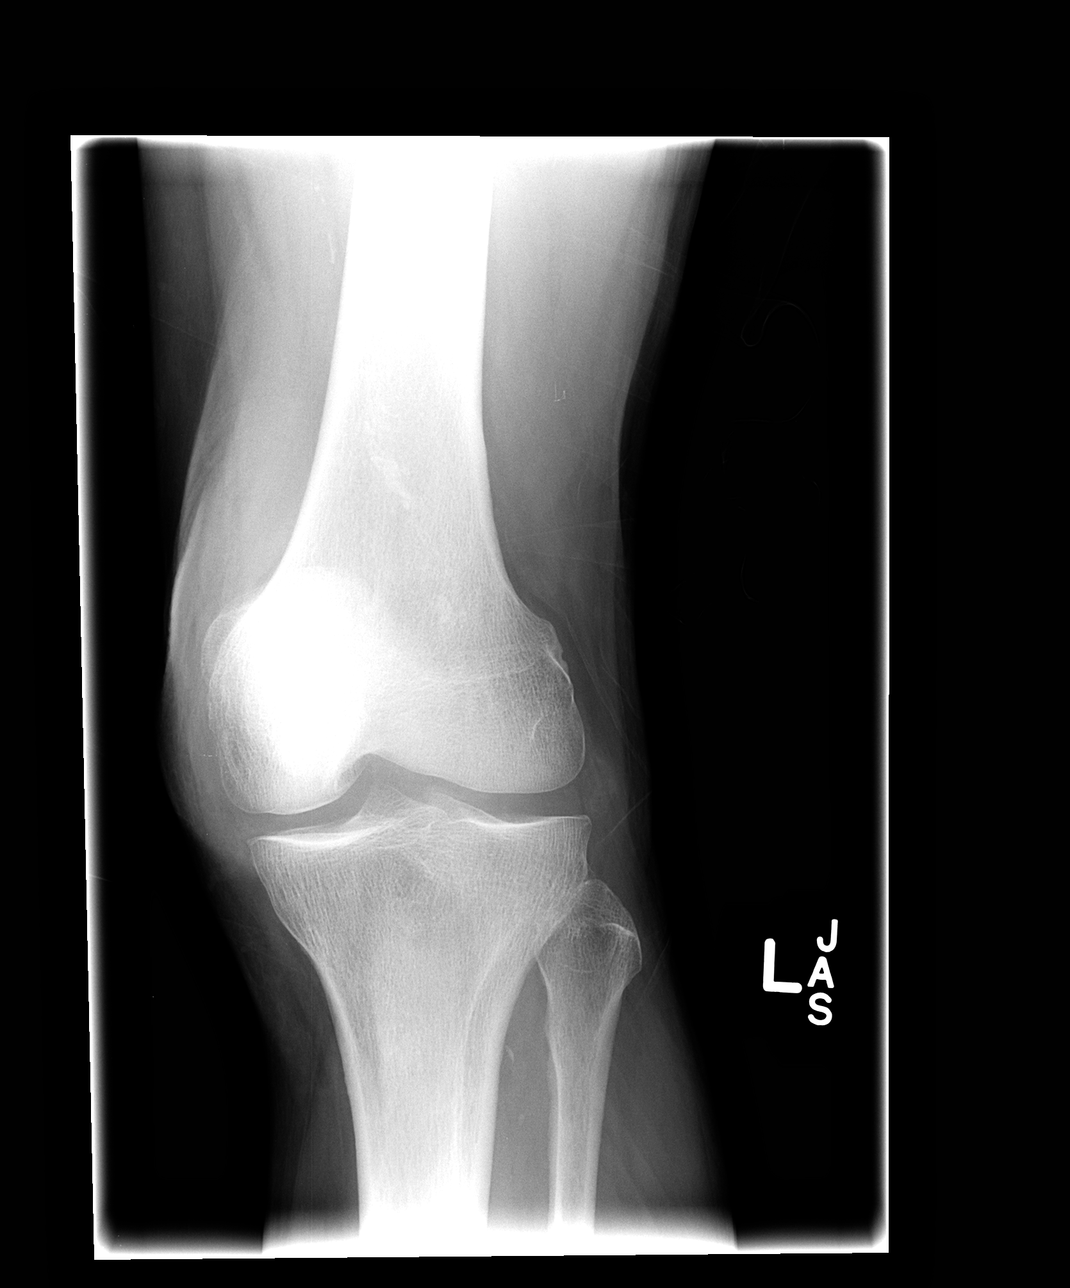

[view not recorded (2 of 4)]
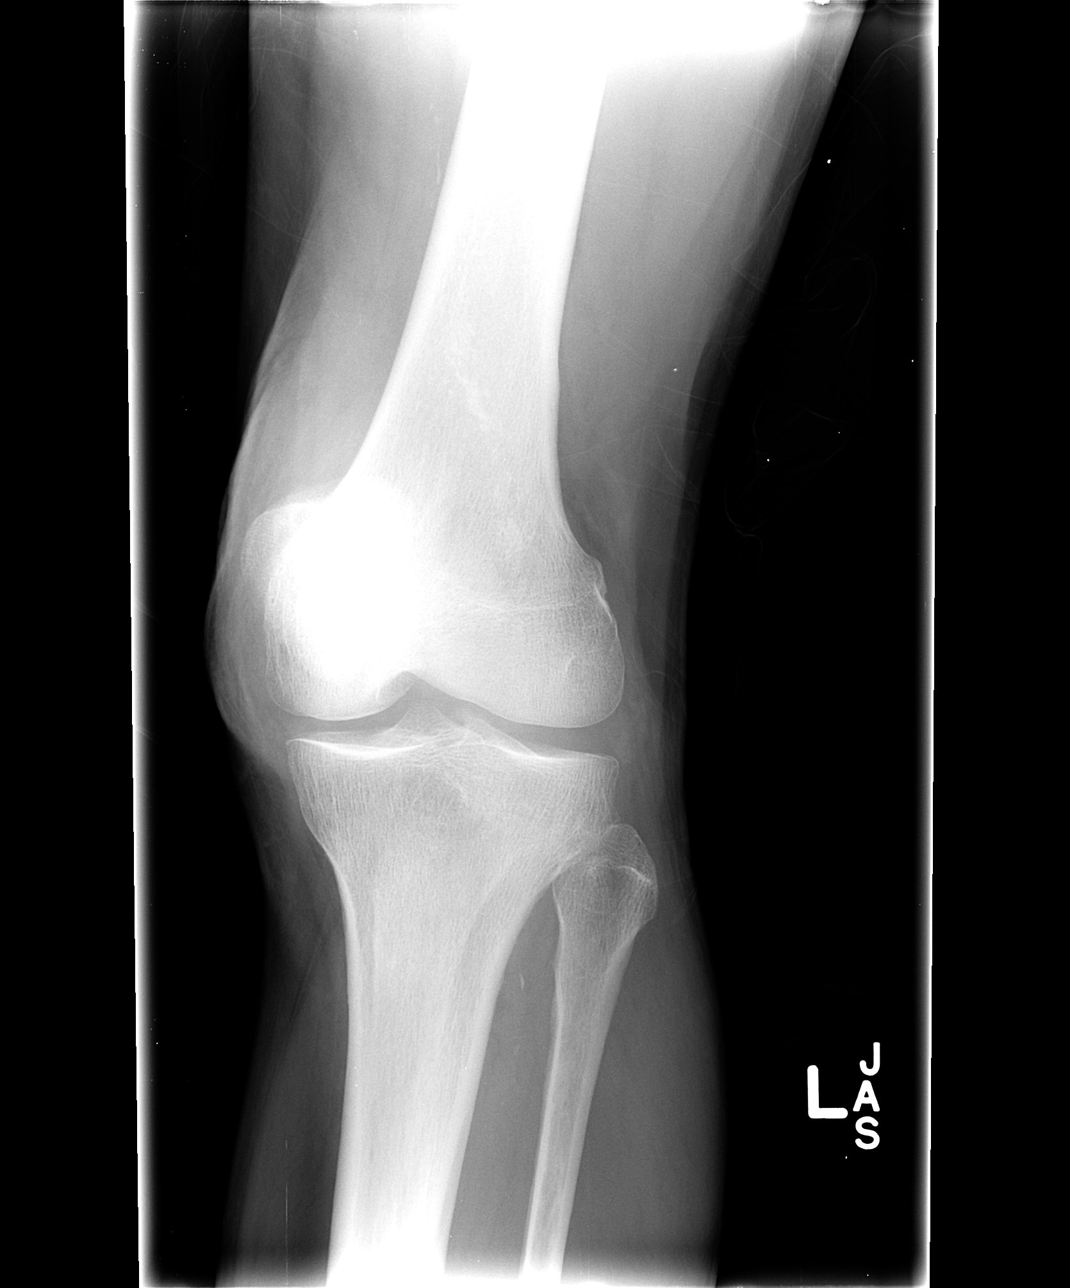

[view not recorded (3 of 4)]
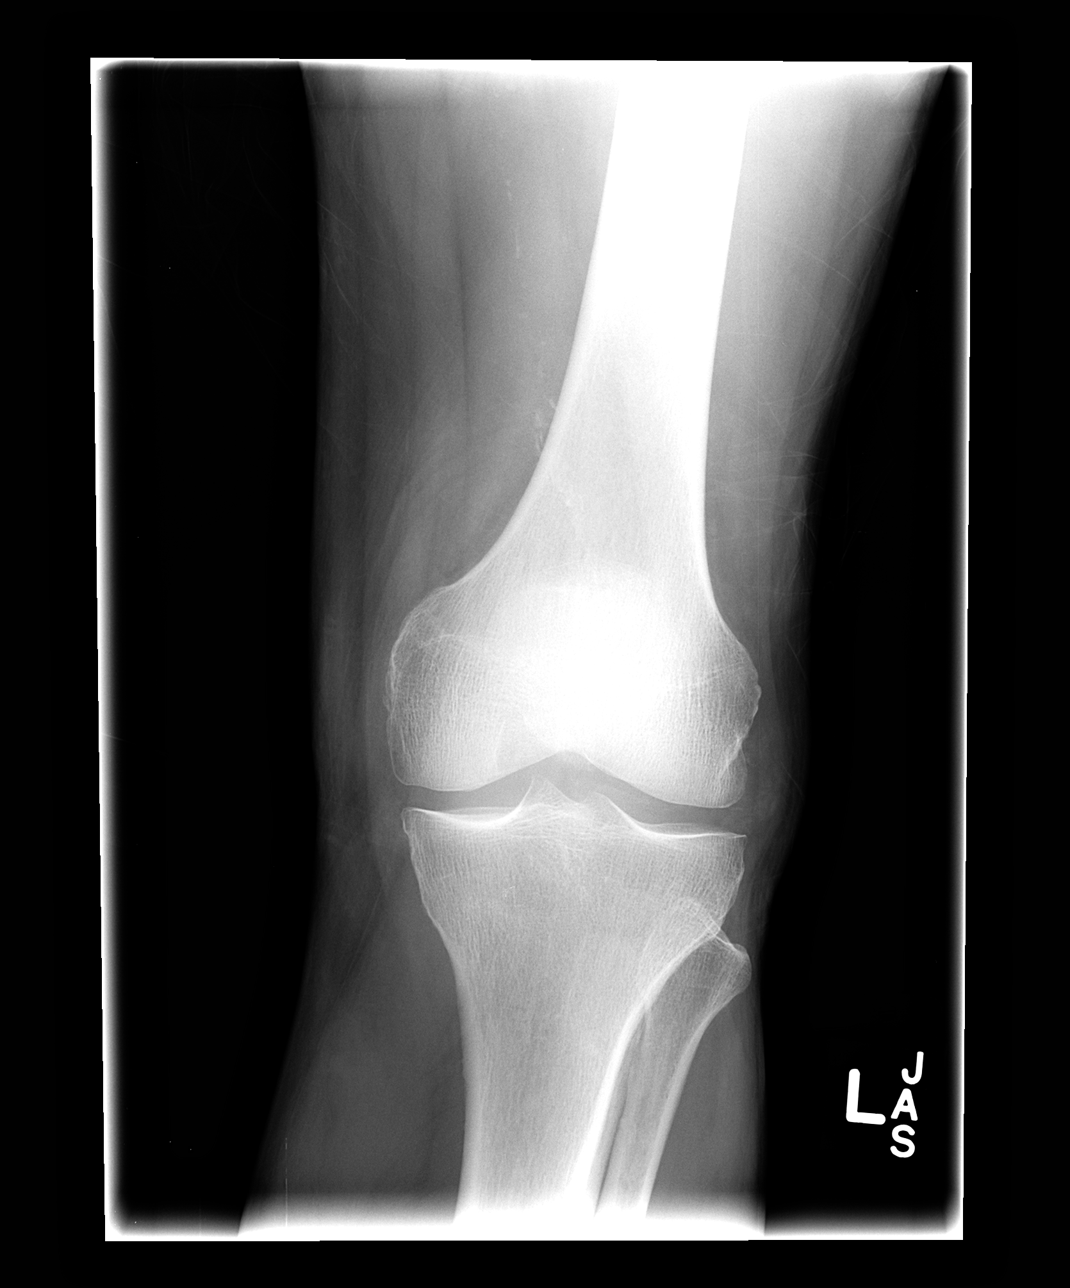

[view not recorded (4 of 4)]
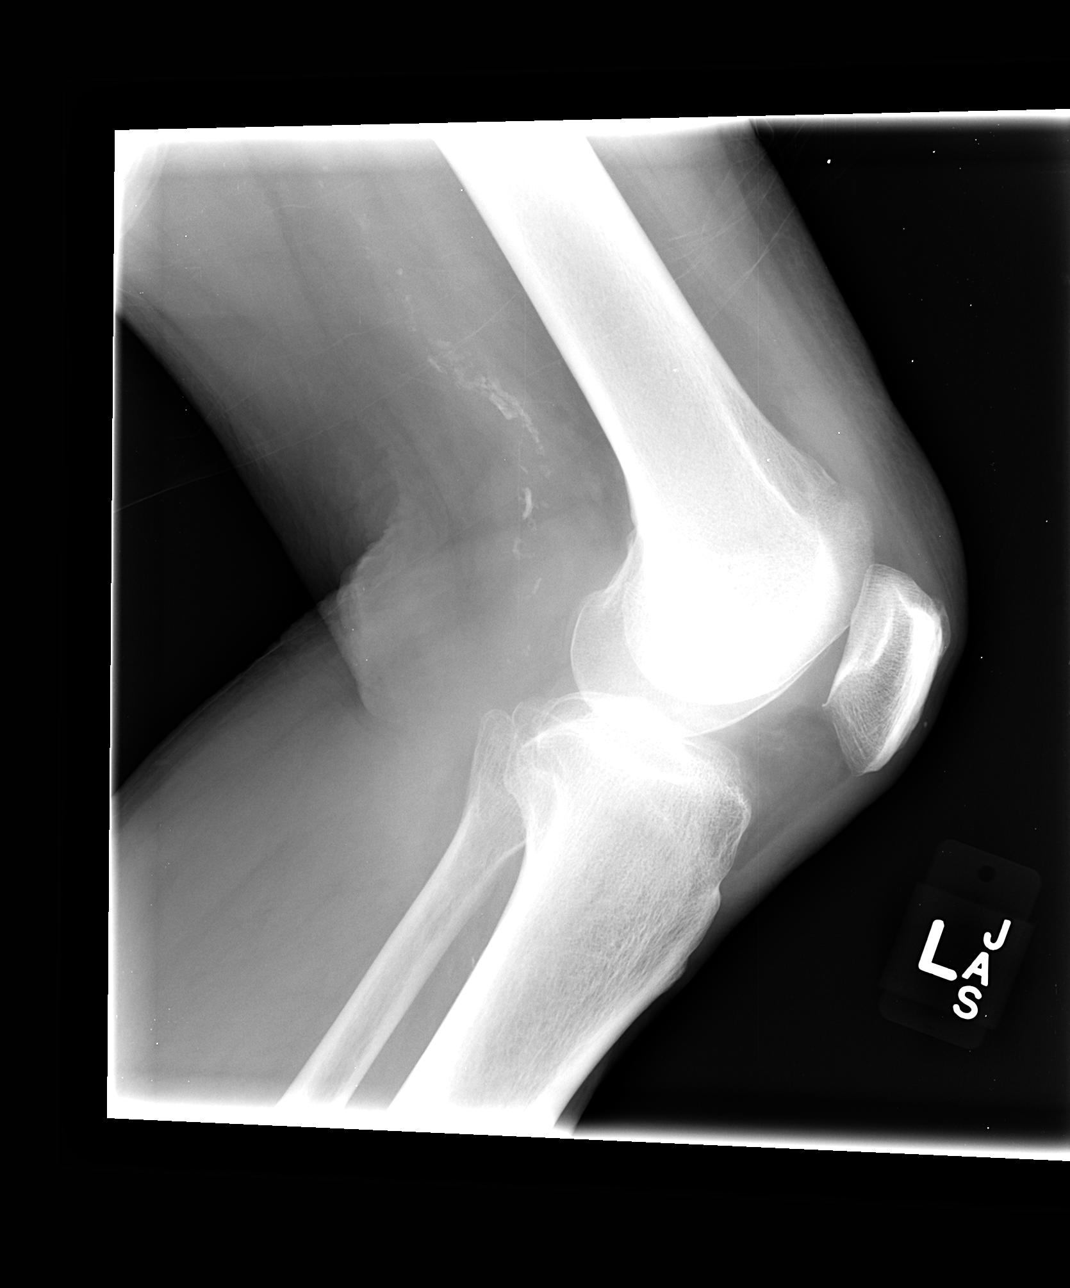

[4 of 4 positions shown; findings below may reference images not displayed]

FINDINGS: No evidence of fracture, dislocation or focal lesion. There is a
tiny amount of joint fluid. Arterial calcification is noted.
IMPRESSION: Tiny amount of joint fluid. No evidence of fracture or significant
degenerative change.

## 2014-09-05 ENCOUNTER — Encounter: Payer: BC Managed Care – PPO | Admitting: Internal Medicine

## 2014-11-18 ENCOUNTER — Other Ambulatory Visit: Payer: Self-pay | Admitting: Internal Medicine

## 2014-11-24 ENCOUNTER — Telehealth: Payer: Self-pay

## 2014-11-24 NOTE — Telephone Encounter (Signed)
Patient called to educate on [employer] Wellness apt. LVM for the patient to call back to educate and schedule for wellness visit.  Needs apt for CPE with Dr. [name]; very infrequent visits;

## 2014-11-25 NOTE — Telephone Encounter (Signed)
Call rec'd from Mr. Arvis, [place] [facility] prior to annual CPE and agreed to come in for apt. Scheduled for 8/24 at 8am.

## 2014-12-03 ENCOUNTER — Ambulatory Visit (INDEPENDENT_AMBULATORY_CARE_PROVIDER_SITE_OTHER): Payer: Medicare Other

## 2014-12-03 VITALS — BP 190/80 | Ht 68.0 in | Wt 147.8 lb

## 2014-12-03 DIAGNOSIS — Z0000 Encounter for general adult medical examination without abnormal findings: Secondary | ICD-10-CM | POA: Diagnosis not present

## 2014-12-03 MED ORDER — LISINOPRIL 20 MG PO TABS: 20.0000 mg | ORAL_TABLET | Freq: Every day | ORAL | Status: DC

## 2014-12-03 NOTE — Patient Instructions (Addendum)
Christopher Rubio , Thank you for taking time to come for your Medicare Wellness Visit. I appreciate your ongoing commitment to your health goals. Please review the following plan we discussed and let me know if I can assist you in the future.   Smoking;  Educated to avoid secondary smoke Smoking cessation at [facility]: [phone] Classes offered a couple of times a month;  Will work with the patient as far as registration and location  Meds may help; chatix (Varenicline); Zyban (Bupropion SR); Nicotine Replacement (gum; lozenges; patches; etc.)   30 pack yr smoking hx: Educated regarding LDCT; To discuss with MD at next fup. Also educated on AAA screening for men 65-75 who have smoked  Abdominal Aorttic aneurysm   Review your advanced directives when you feel up to it; to bring the office a copy  Will discuss slow urine flow with Dr. [name]; I will ask about meds  These are the goals we discussed: Goals    . will continue yoga     Will continue to embrace yoga and experiment with other forms of relaxation       This is a list of the screening recommended for you and due dates:  Health Maintenance  Topic Date Due  . Pneumonia vaccines (2 of 2 - PCV13) 12/26/2013  . Flu Shot  11/10/2014  . Tetanus Vaccine  08/11/2018  . Colon Cancer Screening  08/10/2023  . Shingles Vaccine  Completed     Fall Prevention and Home Safety Falls cause injuries and can affect all age groups. It is possible to prevent falls.  HOW TO PREVENT FALLS  Wear shoes with rubber soles that do not have an opening for your toes.  Keep the inside and outside of your house well lit.  Use night lights throughout your home.  Remove clutter from floors.  Clean up floor spills.  Remove throw rugs or fasten them to the floor with carpet tape.  Do not place electrical cords across pathways.  Put grab bars by your tub, shower, and toilet. Do not use towel bars as grab bars.  Put handrails on both sides of  the stairway. Fix loose handrails.  Do not climb on stools or stepladders, if possible.  Do not wax your floors.  Repair uneven or unsafe sidewalks, walkways, or stairs.  Keep items you use a lot within reach.  Be aware of pets.  Keep emergency numbers next to the telephone.  Put smoke detectors in your home and near bedrooms. Ask your doctor what other things you can do to prevent falls. Document Released: 01/22/2009 Document Revised: 09/27/2011 Document Reviewed: 06/28/2011 [facility] Patient Information 2015 [place], [place]. This information is not intended to replace advice given to you by your health care provider. Make sure you discuss any questions you have with your health care provider.  Health Maintenance A healthy lifestyle and preventative care can promote health and wellness.  Maintain regular health, dental, and eye exams.  Eat a healthy diet. Foods like vegetables, fruits, whole grains, low-fat dairy products, and lean protein foods contain the nutrients you need and are low in calories. Decrease your intake of foods high in solid fats, added sugars, and salt. Get information about a proper diet from your health care provider, if necessary.  Regular physical exercise is one of the most important things you can do for your health. Most adults should get at least 150 minutes of moderate-intensity exercise (any activity that increases your heart rate and causes you to  sweat) each week. In addition, most adults need muscle-strengthening exercises on 2 or more days a week.   Maintain a healthy weight. The body mass index (BMI) is a screening tool to identify possible weight problems. It provides an estimate of body fat based on height and weight. Your health care provider can find your BMI and can help you achieve or maintain a healthy weight. For males 20 years and older:  A BMI below 18.5 is considered underweight.  A BMI of 18.5 to 24.9 is normal.  A BMI of 25 to 29.9 is  considered overweight.  A BMI of 30 and above is considered obese.  Maintain normal blood lipids and cholesterol by exercising and minimizing your intake of saturated fat. Eat a balanced diet with plenty of fruits and vegetables. Blood tests for lipids and cholesterol should begin at age 30 and be repeated every 5 years. If your lipid or cholesterol levels are high, you are over age 68, or you are at high risk for heart disease, you may need your cholesterol levels checked more frequently.Ongoing high lipid and cholesterol levels should be treated with medicines if diet and exercise are not working.  If you smoke, find out from your health care provider how to quit. If you do not use tobacco, do not start.  Lung cancer screening is recommended for adults aged 17-80 years who are at high risk for developing lung cancer because of a history of smoking. A yearly low-dose CT scan of the lungs is recommended for people who have at least a 30-pack-year history of smoking and are current smokers or have quit within the past 15 years. A pack year of smoking is smoking an average of 1 pack of cigarettes a day for 1 year (for example, a 30-pack-year history of smoking could mean smoking 1 pack a day for 30 years or 2 packs a day for 15 years). Yearly screening should continue until the smoker has stopped smoking for at least 15 years. Yearly screening should be stopped for people who develop a health problem that would prevent them from having lung cancer treatment.  If you choose to drink alcohol, do not have more than 2 drinks per day. One drink is considered to be 12 oz (360 mL) of beer, 5 oz (150 mL) of wine, or 1.5 oz (45 mL) of liquor.  Avoid the use of street drugs. Do not share needles with anyone. Ask for help if you need support or instructions about stopping the use of drugs.  High blood pressure causes heart disease and increases the risk of stroke. Blood pressure should be checked at least every  1-2 years. Ongoing high blood pressure should be treated with medicines if weight loss and exercise are not effective.  If you are 97-73 years old, ask your health care provider if you should take aspirin to prevent heart disease.  Diabetes screening involves taking a blood sample to check your fasting blood sugar level. This should be done once every 3 years after age 26 if you are at a normal weight and without risk factors for diabetes. Testing should be considered at a younger age or be carried out more frequently if you are overweight and have at least 1 risk factor for diabetes.  Colorectal cancer can be detected and often prevented. Most routine colorectal cancer screening begins at the age of 11 and continues through age 12. However, your health care provider may recommend screening at an earlier age if you  have risk factors for colon cancer. On a yearly basis, your health care provider may provide home test kits to check for hidden blood in the stool. A small camera at the end of a tube may be used to directly examine the colon (sigmoidoscopy or colonoscopy) to detect the earliest forms of colorectal cancer. Talk to your health care provider about this at age 33 when routine screening begins. A direct exam of the colon should be repeated every 5-10 years through age 47, unless early forms of precancerous polyps or small growths are found.  People who are at an increased risk for hepatitis B should be screened for this virus. You are considered at high risk for hepatitis B if:  You were born in a country where hepatitis B occurs often. Talk with your health care provider about which countries are considered high risk.  Your parents were born in a high-risk country and you have not received a shot to protect against hepatitis B (hepatitis B vaccine).  You have HIV or AIDS.  You use needles to inject street drugs.  You live with, or have sex with, someone who has hepatitis B.  You are a man  who has sex with other men (MSM).  You get hemodialysis treatment.  You take certain medicines for conditions like cancer, organ transplantation, and autoimmune conditions.  Hepatitis C blood testing is recommended for all people born from [birth year] through 1963 and any individual with known risk factors for hepatitis C.  Healthy men should no longer receive prostate-specific antigen (PSA) blood tests as part of routine cancer screening. Talk to your health care provider about prostate cancer screening.  Testicular cancer screening is not recommended for adolescents or adult males who have no symptoms. Screening includes self-exam, a health care provider exam, and other screening tests. Consult with your health care provider about any symptoms you have or any concerns you have about testicular cancer.  Practice safe sex. Use condoms and avoid high-risk sexual practices to reduce the spread of sexually transmitted infections (STIs).  You should be screened for STIs, including gonorrhea and chlamydia if:  You are sexually active and are younger than 24 years.  You are older than 24 years, and your health care provider tells you that you are at risk for this type of infection.  Your sexual activity has changed since you were last screened, and you are at an increased risk for chlamydia or gonorrhea. Ask your health care provider if you are at risk.  If you are at risk of being infected with HIV, it is recommended that you take a prescription medicine daily to prevent HIV infection. This is called pre-exposure prophylaxis (PrEP). You are considered at risk if:  You are a man who has sex with other men (MSM).  You are a heterosexual man who is sexually active with multiple partners.  You take drugs by injection.  You are sexually active with a partner who has HIV.  Talk with your health care provider about whether you are at high risk of being infected with HIV. If you choose to begin PrEP, you  should first be tested for HIV. You should then be tested every 3 months for as long as you are taking PrEP.  Use sunscreen. Apply sunscreen liberally and repeatedly throughout the day. You should seek shade when your shadow is shorter than you. Protect yourself by wearing long sleeves, pants, a wide-brimmed hat, and sunglasses year round whenever you are outdoors.  Tell  your health care provider of new moles or changes in moles, especially if there is a change in shape or color. Also, tell your health care provider if a mole is larger than the size of a pencil eraser.  A one-time screening for abdominal aortic aneurysm (AAA) and surgical repair of large AAAs by ultrasound is recommended for men aged 54-75 years who are current or former smokers.  Stay current with your vaccines (immunizations). Document Released: 09/24/2007 Document Revised: 04/02/2013 Document Reviewed: 08/23/2010 [facility] Patient Information 2015 [place], [place]. This information is not intended to replace advice given to you by your health care provider. Make sure you discuss any questions you have with your health care provider.

## 2014-12-03 NOTE — Progress Notes (Addendum)
Subjective:   Christopher Rubio. is a 81 y.o. male who presents for an Initial Medicare Annual Wellness Visit.  Review of Systems   HRA assessment completed during visit;  Patient is here for Annual Wellness Assessment The Patient was informed that this wellness visit is to identify risk and educate on how to reduce risk for increase disease through lifestyle changes.  The patient verbalized understanding that any voiced medical issues still need to be directed to their physician.    Problem list reviewed and are being managed medically;  Will educate for lifestyle changes as appropriate   HTN; BP elevated at this visit today; BP 222/80 and came down to 190 / 80. States he has not taken his BP medicine in 2 days because he is out. Has apt with Dr. [name] 9/1; refilled BP med and the patient stated he would go by the pharmacy this am to pick rx up and will take it this am. BP recheck on 9/1 at scheduled apt; The patient was asymptomatic and had no idea his BP was elevated. Dicussed need to monitor sodium and take medicine and Dr. [name] will recheck and re-evaluate treatment plan.   PAD; Still states he has poor circulation but does not have pain. Encouraged to protect feet; Does not take Plental as he states it did not help; Apt 9/1 for review.  ETOH; The patient states he now drinks very rarely; no illicit drugs;   Hyperlipidemia; Trig 404; chol 219; HDL 29; ratio 7 / educated regarding triglycerides; ETOH and risk; Patient stated he stopped drinking except occasionally and will monitor fat and sugar intake.   Chronic kidney disease; bun 35; creat 1.6; GFR 46.34; To have labs re-drawn  Srress:  Caregiver to wife x 5 years and lost wife approx 3 months ago.   Diet; Lunch and supper; sandwich; supper cook meat and garden;  Exercise; Golf and yoga per the recommendation of hospice. He does like it and plans to continue.  Still push mows yard; calf cramp/ will stop x 2 to 3 times;  doffler exam was "fine" Pain 1-10; no pain Falls; no falls One level home; access is already taken care of ADL's and IADLs/ does all the housekeeping; laundry    Discussed Goal to improve health based on risk  Screenings overdue Ophthalmology exam; eye apt; Dr. Satira Sark To be completed Immunizations; Prevnar: had this at "booster pneumonia" Is sure he had it.  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 [place];  (Call placed to [employer] at [place] and per [place], this patient did receive a prevnar on 03/13/2014  Shingles had in 02/2010 Colonoscopy; 08/2013 EKG 03/2013 Hearing: 2000 hz both ears Dental: when necessary  Gave information on safety to take home;   Current Care Team reviewed and updated Cardiac Risk Factors include: advanced age (>17men, >68 women);dyslipidemia;hypertension;smoking/ tobacco exposure    Objective:    Today's Vitals   12/03/14 0815 12/03/14 1347  BP: 220/80 190/80  Height: 5\' 8"  (1.727 m)   Weight: 147 lb 12 oz (67.019 kg)     Current Medications (verified) Outpatient Encounter Prescriptions as of 12/03/2014  Medication Sig  . colchicine 0.6 MG tablet Take 1 tablet (0.6 mg total) by mouth daily as needed (gout).  Marland Kitchen ezetimibe (ZETIA) 10 MG tablet Take 1 tablet (10 mg total) by mouth daily.  . fish oil-omega-3 fatty acids 1000 MG capsule Take 1 g by mouth daily.   Marland Kitchen lisinopril (PRINIVIL,ZESTRIL) 20 MG tablet Take 1  tablet (20 mg total) by mouth daily.  . [DISCONTINUED] lisinopril (PRINIVIL,ZESTRIL) 20 MG tablet Take 1 tablet (20 mg total) by mouth daily.  Marland Kitchen allopurinol (ZYLOPRIM) 100 MG tablet Take 1 tablet (100 mg total) by mouth daily. (Patient not taking: Reported on 12/03/2014)  . Cholecalciferol (VITAMIN D) 2000 UNITS CAPS Take 2,000 Units by mouth daily.  . cilostazol (PLETAL) 50 MG tablet Take 50 mg by mouth daily.  . hydroxypropyl methylcellulose (ISOPTO TEARS) 2.5 % ophthalmic solution Place 1 drop into both eyes daily as needed for dry eyes.    No facility-administered encounter medications on file as of 12/03/2014.    Allergies (verified) Statins; Codeine; Neomycin-bacitracin zn-polymyx; Penicillins; and Prednisone   History: Past Medical History  Diagnosis Date  . Impotence of organic origin   . Contact dermatitis and other eczema, due to unspecified cause   . Benign neoplasm of colon   . Gout, unspecified   . Other and unspecified hyperlipidemia   . Unspecified essential hypertension   . Diverticulosis of colon (without mention of hemorrhage)   . Basal cell carcinoma of skin, site unspecified   . Extrinsic asthma, unspecified    Past Surgical History  Procedure Laterality Date  . Tonsillectomy     Family History  Problem Relation Age of Onset  . Coronary artery disease Father 64    deceased  . Heart attack Father   . Stroke Mother   . Hypertension Mother   . Diabetes Neg Hx   . Cancer Neg Hx     colon, prostate   Social History   Occupational History  . retired Pharmacist, hospital    Social History Main Topics  . Smoking status: Current Every Day Smoker -- 1.00 packs/day for 60 years    Types: Cigarettes  . Smokeless tobacco: Never Used     Comment: explained LDCT; smokes more since wife died;   . Alcohol Use: 3.0 oz/week    5 Cans of beer per week     Comment: Rarely drink anything  . Drug Use: No  . Sexual Activity: Not Currently   Tobacco Counseling Ready to quit: Not Answered Counseling given: Not Answered   Activities of Daily Living In your present state of health, do you have any difficulty performing the following activities: 12/03/2014  Hearing? N  Vision? N  Difficulty concentrating or making decisions? N  Walking or climbing stairs? N  Dressing or bathing? N  Doing errands, shopping? N  Preparing Food and eating ? N  Using the Toilet? N  In the past six months, have you accidently leaked urine? Y  Do you have problems with loss of bowel control? N  Managing your Medications? N   Managing your Finances? N  Housekeeping or managing your Housekeeping? N    Immunizations and Health Maintenance Immunization History  Administered Date(s) Administered  . Influenza,inj,Quad PF,36+ Mos 02/25/2013, 12/30/2013  . Pneumococcal Conjugate-13 03/13/2014  . Pneumococcal Polysaccharide-23 12/26/2012  . Td 08/10/2008  . Zoster 02/13/2010   Health Maintenance Due  Topic Date Due  . PNA vac Low Risk Adult (2 of 2 - PCV13) 12/26/2013  . INFLUENZA VACCINE  11/10/2014    Patient Care Team: [name], MD as PCP - General (Internal Medicine)  Indicate any recent Medical Services you may have received from other than [facility] providers in the past year (date may be approximate).    Assessment:   This is a routine wellness examination for [place].   The goal of the wellness  visit is to assist the patient how to close the gaps in care and create a preventative care plan for the patient.  Personalized Education was given regarding taking BP medication;  RX refilled and will fup with Dr. [name] 9/1 Also states he has noted urine stream is slow and will discuss that as well.   Pt determined a personalized goal; see patient goals;  Assessment included: smoking (secondary) educated as appropriate; including LDCT if as appropriate. Marcelina Morel discussed AAA screening if he has not had this.  Med reviewed; the patient is not taking allopurinol but does take colchicine if he has a flare. He is not taking Vit D 2000 u He is not taking Pletal 50 for PAD as he states it does not help; He is taking Zetia; He was not taking Fish oil but has agreed to start again after review of his Trig; Is not taking drops for dry eyes; He has not taken his lisinopril in 2 days as he ran out. Refill x 30 days completed and he will pick up and take one this am; fup with Dr. [name] on 8/30/ Labs to be drawn at MD apt.  Stress: Loss of wife;   No Risk for hepatitis or high risk social behavior identified via  hepatitis screen but this was discussed. Requested samples of viagra or other but Dr. [name] is not in today. Did educate on risk.   Educated on shingles and follow up with insurance company for co-pays or charges applied to Part D benefit. Educated on Vaccines;  Safety issues reviewed;  Cognition assessed by AD8; Score 0 MMSE deferred as the patient stated they had no memory issues; No identified risk were noted; The patient was oriented x 3; appropriate in dress and manner and no objective failures at ADL's or IADL's.   Depression screen negative;   Functional status reviewed; no losses in function x 1 year  Bowel and bladder issues assessed/ to discuss urine issues with Dr. [name].  End of life planning was discussed; aging in home or other; will update AD since wife has expired.   Hearing/Vision screen  Hearing Screening   125Hz  250Hz  500Hz  1000Hz  2000Hz  4000Hz  8000Hz   Right ear:     100    Left ear:     100      Dietary issues and exercise activities discussed: Current Exercise Habits:: Home exercise routine (plays golf), Time (Minutes): 45, Frequency (Times/Week): 3, Weekly Exercise (Minutes/Week): 135  Goals    . will continue yoga     Will continue to embrace yoga and experiment with other forms of relaxation      Depression Screen PHQ 2/9 Scores 12/03/2014 12/10/2013  PHQ - 2 Score 0 0    Fall Risk Fall Risk  12/03/2014 12/10/2013  Falls in the past year? No No    Cognitive Function: No flowsheet data found.  Screening Tests Health Maintenance  Topic Date Due  . PNA vac Low Risk Adult (2 of 2 - PCV13) 12/26/2013  . INFLUENZA VACCINE  11/10/2014  . TETANUS/TDAP  08/11/2018  . COLONOSCOPY  08/10/2023  . ZOSTAVAX  Completed        Plan:    Agreed to receive information on Smoking cessation; No plan to quit; no desire to quit at this time Smoking cessation at [facility] [place]: [phone] Classes offered a couple of times a month;  Will work with the patient as far  as registration and location  Meds may help; chatix (Varenicline); Zyban (Bupropion SR);  Nicotine Replacement (gum; lozenges; patches; etc.)  30 pack yr smoking hx: Educated regarding LDCT; To discuss with MD at next fup. Also educated on AAA screening for men 65-75 who have smoked  Review your advanced directives when you feel up to it; to bring the office a copy  Will discuss slow urine flow with Dr. [name]; I will ask about med/ Lisinopril refilled    During the course of the visit [place] was educated and counseled about the following appropriate screening and preventive services:   Vaccines to include Pneumoccal, Influenza, Hepatitis B, Td, Zostavax, HCV/ up to date  Electrocardiogram; 03/2013  Colorectal cancer screening/ 08/2013  Cardiovascular disease screening/ Reviewed  Smoking and trig;   Diabetes screening/ Glucose 97 11/2013  Glaucoma screening/ has eye apt with Dr. Satira Sark  Nutrition counseling/ deferred   Prostate cancer screening deferred  Smoking cessation counseling given with resources.   Patient Instructions (the written plan) were given to the patient.   Wynetta Fines, RN   12/03/2014       Medical screening examination/treatment/procedure(s) were performed by non-physician practitioner and as supervising physician I was immediately available for consultation/collaboration. I agree with above. Scarlette Calico, MD

## 2014-12-05 ENCOUNTER — Telehealth: Payer: Self-pay

## 2014-12-05 MED ORDER — SILDENAFIL CITRATE 50 MG PO TABS: 50.0000 mg | ORAL_TABLET | Freq: Every day | ORAL | Status: DC | PRN

## 2014-12-05 MED ORDER — HYDROCHLOROTHIAZIDE 12.5 MG PO CAPS: 12.5000 mg | ORAL_CAPSULE | Freq: Every day | ORAL | Status: DC

## 2014-12-05 NOTE — Telephone Encounter (Signed)
Medications sent to pharmacy. Please follow up with Dr. [name] for the blood pressure.

## 2014-12-05 NOTE — Telephone Encounter (Signed)
Rec'd call this am regarding request for viagra or cilalis and explained that Dr. [name] was on vacation and that a note was sent but he would not be back in town until next week. The patient wanted samples. Asked about his BP med and stated the did pick up his Lisinopril and has been taking it since Wed.  Has not has his BP rechecked.  Discussed with Terri Piedra; will review for med for erectile dysfunction as well as other BP med due to elevated BP during AWV when the patient stated he had not taken his BP med in 2 days;

## 2014-12-11 ENCOUNTER — Encounter: Payer: Self-pay | Admitting: Internal Medicine

## 2014-12-11 ENCOUNTER — Ambulatory Visit (INDEPENDENT_AMBULATORY_CARE_PROVIDER_SITE_OTHER): Payer: Medicare Other | Admitting: Internal Medicine

## 2014-12-11 ENCOUNTER — Other Ambulatory Visit (INDEPENDENT_AMBULATORY_CARE_PROVIDER_SITE_OTHER): Payer: Medicare Other

## 2014-12-11 VITALS — BP 170/80 | HR 51 | Temp 97.5°F | Resp 16 | Ht 68.0 in | Wt 146.0 lb

## 2014-12-11 DIAGNOSIS — D539 Nutritional anemia, unspecified: Secondary | ICD-10-CM

## 2014-12-11 DIAGNOSIS — M1000 Idiopathic gout, unspecified site: Secondary | ICD-10-CM | POA: Diagnosis not present

## 2014-12-11 DIAGNOSIS — I10 Essential (primary) hypertension: Secondary | ICD-10-CM

## 2014-12-11 DIAGNOSIS — N401 Enlarged prostate with lower urinary tract symptoms: Secondary | ICD-10-CM

## 2014-12-11 DIAGNOSIS — Z23 Encounter for immunization: Secondary | ICD-10-CM | POA: Diagnosis not present

## 2014-12-11 DIAGNOSIS — E785 Hyperlipidemia, unspecified: Secondary | ICD-10-CM

## 2014-12-11 DIAGNOSIS — Z0000 Encounter for general adult medical examination without abnormal findings: Secondary | ICD-10-CM

## 2014-12-11 DIAGNOSIS — N183 Chronic kidney disease, stage 3 unspecified: Secondary | ICD-10-CM

## 2014-12-11 DIAGNOSIS — R351 Nocturia: Secondary | ICD-10-CM

## 2014-12-11 DIAGNOSIS — N400 Enlarged prostate without lower urinary tract symptoms: Secondary | ICD-10-CM | POA: Insufficient documentation

## 2014-12-11 DIAGNOSIS — I739 Peripheral vascular disease, unspecified: Secondary | ICD-10-CM

## 2014-12-11 LAB — CBC WITH DIFFERENTIAL/PLATELET
BASOS PCT: 1.2 % (ref 0.0–3.0)
Basophils Absolute: 0.1 10*3/uL (ref 0.0–0.1)
EOS PCT: 3.4 % (ref 0.0–5.0)
Eosinophils Absolute: 0.3 10*3/uL (ref 0.0–0.7)
HCT: 43.9 % (ref 39.0–52.0)
Hemoglobin: 14.8 g/dL (ref 13.0–17.0)
LYMPHS ABS: 2.4 10*3/uL (ref 0.7–4.0)
Lymphocytes Relative: 25 % (ref 12.0–46.0)
MCHC: 33.6 g/dL (ref 30.0–36.0)
MCV: 89.4 fl (ref 78.0–100.0)
MONO ABS: 0.9 10*3/uL (ref 0.1–1.0)
MONOS PCT: 9.8 % (ref 3.0–12.0)
NEUTROS ABS: 5.8 10*3/uL (ref 1.4–7.7)
NEUTROS PCT: 60.6 % (ref 43.0–77.0)
Platelets: 243 10*3/uL (ref 150.0–400.0)
RBC: 4.91 Mil/uL (ref 4.22–5.81)
RDW: 14.6 % (ref 11.5–15.5)
WBC: 9.5 10*3/uL (ref 4.0–10.5)

## 2014-12-11 LAB — COMPREHENSIVE METABOLIC PANEL
ALK PHOS: 78 U/L (ref 39–117)
ALT: 14 U/L (ref 0–53)
AST: 14 U/L (ref 0–37)
Albumin: 4.3 g/dL (ref 3.5–5.2)
BUN: 27 mg/dL — ABNORMAL HIGH (ref 6–23)
CHLORIDE: 107 meq/L (ref 96–112)
CO2: 26 mEq/L (ref 19–32)
Calcium: 9.4 mg/dL (ref 8.4–10.5)
Creatinine, Ser: 1.25 mg/dL (ref 0.40–1.50)
GFR: 59.24 mL/min — AB (ref >60.00)
GLUCOSE: 100 mg/dL — AB (ref 70–99)
POTASSIUM: 5 meq/L (ref 3.5–5.1)
Sodium: 141 mEq/L (ref 135–145)
TOTAL PROTEIN: 6.8 g/dL (ref 6.0–8.3)
Total Bilirubin: 0.7 mg/dL (ref 0.2–1.2)

## 2014-12-11 LAB — TSH: TSH: 1.72 u[IU]/mL (ref 0.35–4.50)

## 2014-12-11 LAB — FECAL OCCULT BLOOD, GUAIAC: FECAL OCCULT BLD: NEGATIVE

## 2014-12-11 LAB — LIPID PANEL
CHOLESTEROL: 187 mg/dL (ref 0–200)
HDL: 32.8 mg/dL — AB (ref >39.00)
LDL Cholesterol: 120 mg/dL — ABNORMAL HIGH (ref 0–99)
NONHDL: 154.25
TRIGLYCERIDES: 170 mg/dL — AB (ref 0.0–149.0)
Total CHOL/HDL Ratio: 6
VLDL: 34 mg/dL (ref 0.0–40.0)

## 2014-12-11 LAB — PSA: PSA: 2.4 ng/mL (ref 0.10–4.00)

## 2014-12-11 LAB — URIC ACID: URIC ACID, SERUM: 8.6 mg/dL — AB (ref 4.0–7.8)

## 2014-12-11 MED ORDER — TADALAFIL 5 MG PO TABS: 5.0000 mg | ORAL_TABLET | Freq: Every day | ORAL | Status: DC | PRN

## 2014-12-11 MED ORDER — LISINOPRIL 20 MG PO TABS: 20.0000 mg | ORAL_TABLET | Freq: Every day | ORAL | Status: DC

## 2014-12-11 MED ORDER — EZETIMIBE 10 MG PO TABS: 10.0000 mg | ORAL_TABLET | Freq: Every day | ORAL | Status: DC

## 2014-12-11 NOTE — Progress Notes (Signed)
Pre visit review using our clinic review tool, if applicable. No additional management support is needed unless otherwise documented below in the visit note. 

## 2014-12-11 NOTE — Patient Instructions (Signed)

## 2014-12-12 ENCOUNTER — Encounter: Payer: Self-pay | Admitting: Internal Medicine

## 2014-12-12 NOTE — Assessment & Plan Note (Signed)

## 2014-12-12 NOTE — Progress Notes (Signed)
Subjective:  Patient ID: Christopher Aid., male    DOB: 05/09/1933  Age: 81 y.o. MRN: [id number]  CC: Hypertension; Annual Exam; and Hyperlipidemia   HPI Christopher Rubio. presents for a physical and a blood pressure check. Unfortunately he has been out of lisinopril for several weeks and his blood pressure has gone. He fortunately does not have any signs and symptoms related to this. He does complain of difficulty urinating and wants to know if there is a medication to help with the symptoms.  Outpatient Prescriptions Prior to Visit  Medication Sig Dispense Refill  . Cholecalciferol (VITAMIN D) 2000 UNITS CAPS Take 2,000 Units by mouth daily.    . colchicine 0.6 MG tablet Take 1 tablet (0.6 mg total) by mouth daily as needed (gout). 30 tablet 5  . fish oil-omega-3 fatty acids 1000 MG capsule Take 1 g by mouth daily.     Marland Kitchen ezetimibe (ZETIA) 10 MG tablet Take 1 tablet (10 mg total) by mouth daily. 90 tablet 3  . lisinopril (PRINIVIL,ZESTRIL) 20 MG tablet Take 1 tablet (20 mg total) by mouth daily. 30 tablet 0  . sildenafil (VIAGRA) 50 MG tablet Take 1 tablet (50 mg total) by mouth daily as needed for erectile dysfunction. 10 tablet 0  . hydroxypropyl methylcellulose (ISOPTO TEARS) 2.5 % ophthalmic solution Place 1 drop into both eyes daily as needed for dry eyes.    Marland Kitchen allopurinol (ZYLOPRIM) 100 MG tablet Take 1 tablet (100 mg total) by mouth daily. (Patient not taking: Reported on 12/11/2014) 30 tablet 6  . cilostazol (PLETAL) 50 MG tablet Take 50 mg by mouth daily.    . hydrochlorothiazide (MICROZIDE) 12.5 MG capsule Take 1 capsule (12.5 mg total) by mouth daily. (Patient not taking: Reported on 12/11/2014) 30 capsule 0   No facility-administered medications prior to visit.    ROS Review of Systems  Constitutional: Negative.  Negative for fever, chills, diaphoresis, activity change, appetite change and fatigue.  HENT: Negative.  Negative for sore throat, trouble swallowing and  voice change.   Eyes: Negative.  Negative for photophobia and visual disturbance.  Respiratory: Negative.  Negative for cough, choking, chest tightness, shortness of breath and stridor.   Cardiovascular: Negative.  Negative for chest pain, palpitations and leg swelling.  Gastrointestinal: Negative.  Negative for nausea, vomiting, abdominal pain, diarrhea, constipation and blood in stool.  Endocrine: Negative.   Genitourinary: Positive for frequency and difficulty urinating (nocturia, hesitancy, frequency.). Negative for dysuria, urgency, hematuria, flank pain, decreased urine volume, discharge, penile swelling, scrotal swelling, enuresis, genital sores, penile pain and testicular pain.  Musculoskeletal: Negative.  Negative for myalgias, back pain, arthralgias and neck pain.  Skin: Negative.  Negative for rash.  Allergic/Immunologic: Negative.   Neurological: Negative.  Negative for dizziness, syncope, speech difficulty, weakness, light-headedness, numbness and headaches.  Hematological: Negative.   Psychiatric/Behavioral: Negative.     Objective:  BP 170/80 mmHg  Pulse 51  Temp(Src) 97.5 F (36.4 C) (Oral)  Resp 16  Ht 5\' 8"  (1.727 m)  Wt 146 lb (66.225 kg)  BMI 22.20 kg/m2  SpO2 98%  BP Readings from Last 3 Encounters:  12/11/14 170/80  12/03/14 190/80  12/30/13 130/80    Wt Readings from Last 3 Encounters:  12/11/14 146 lb (66.225 kg)  12/03/14 147 lb 12 oz (67.019 kg)  12/30/13 153 lb (69.4 kg)    Physical Exam  Constitutional: He is oriented to person, place, and time. He appears well-developed and well-nourished. No distress.  HENT:  Head: Normocephalic and atraumatic.  Mouth/Throat: Oropharynx is clear and moist. No oropharyngeal exudate.  Eyes: Conjunctivae are normal. Right eye exhibits no discharge. Left eye exhibits no discharge. No scleral icterus.  Neck: Normal range of motion. Neck supple. No JVD present. No tracheal deviation present. No thyromegaly present.    Cardiovascular: Normal rate, regular rhythm, S1 normal, S2 normal and intact distal pulses.  Exam reveals no gallop and no friction rub.   Murmur heard.  Decrescendo systolic murmur is present with a grade of 1/6   No diastolic murmur is present  Pulses:      Carotid pulses are 1+ on the right side, and 1+ on the left side.      Radial pulses are 1+ on the right side, and 1+ on the left side.       Femoral pulses are 1+ on the right side, and 1+ on the left side.      Popliteal pulses are 1+ on the right side, and 1+ on the left side.       Dorsalis pedis pulses are 1+ on the right side, and 1+ on the left side.       Posterior tibial pulses are 1+ on the right side, and 1+ on the left side.  Pulmonary/Chest: Effort normal and breath sounds normal. No stridor. No respiratory distress. He has no wheezes. He has no rales. He exhibits no tenderness.  Abdominal: Soft. Bowel sounds are normal. He exhibits no distension and no mass. There is no tenderness. There is no rebound and no guarding. Hernia confirmed negative in the right inguinal area and confirmed negative in the left inguinal area.  Genitourinary: Testes normal and penis normal. Rectal exam shows external hemorrhoid. Rectal exam shows no internal hemorrhoid, no fissure, no mass, no tenderness and anal tone normal. Guaiac negative stool. Prostate is enlarged (2+ smooth symm BPH). Prostate is not tender. Right testis shows no mass, no swelling and no tenderness. Right testis is descended. Left testis shows no mass, no swelling and no tenderness. Left testis is descended. Circumcised. No penile erythema or penile tenderness. No discharge found.  Musculoskeletal: Normal range of motion. He exhibits no edema or tenderness.  Lymphadenopathy:    He has no cervical adenopathy.       Right: No inguinal adenopathy present.       Left: No inguinal adenopathy present.  Neurological: He is oriented to person, place, and time.  Skin: Skin is warm and  dry. No rash noted. He is not diaphoretic. No erythema. No pallor.  Psychiatric: He has a normal mood and affect. Judgment normal. His mood appears not anxious. His affect is not angry, not blunt, not labile and not inappropriate. His speech is delayed. His speech is not rapid and/or pressured and not tangential. He is slowed and withdrawn. Cognition and memory are normal. He does not exhibit a depressed mood. He expresses no homicidal and no suicidal ideation. He expresses no suicidal plans and no homicidal plans. He is inattentive.  Vitals reviewed.   Lab Results  Component Value Date   WBC 9.8 12/09/2013   HGB 13.8 12/09/2013   HCT 40.6 12/09/2013   PLT 232.0 12/09/2013   GLUCOSE 97 12/09/2013   CHOL 219* 12/09/2013   TRIG 404.0* 12/09/2013   HDL 29.40* 12/09/2013   LDLDIRECT 131.4 12/09/2013   ALT 21 12/09/2013   AST 19 12/09/2013   NA 141 12/09/2013   K 4.7 12/09/2013   CL 104 12/09/2013  CREATININE 1.6* 12/09/2013   BUN 35* 12/09/2013   CO2 26 12/09/2013   TSH 2.39 12/09/2013    Dg Chest 2 View  04/07/2013   CLINICAL DATA:  Hypertension; loss of consciousness  EXAM: CHEST  2 VIEW  COMPARISON:  None.  FINDINGS: There is mild eventration of the left hemidiaphragm. There is mild left base atelectasis. Lungs are otherwise clear. Heart is upper normal in size with normal pulmonary vascularity. No adenopathy. There is atherosclerotic change in the aorta. No bone lesions.  IMPRESSION: Mild left base atelectasis.  No edema or consolidation.   Electronically Signed   By: Lowella Grip M.D.   On: 04/07/2013 16:23   Ct Head Wo Contrast  04/08/2013   CLINICAL DATA:  81 year old male with generalized weakness. Initial encounter.  EXAM: CT HEAD WITHOUT CONTRAST  TECHNIQUE: Contiguous axial images were obtained from the base of the skull through the vertex without intravenous contrast.  COMPARISON:  04/07/2013 and earlier.  FINDINGS: Moderate right maxillary sinus mucosal thickening  appears stable. Other Visualized paranasal sinuses and mastoids are clear. No acute osseous abnormality identified. *INSERT* calyx  No suspicious intracranial vascular hyperdensity. No evidence of cortically based acute infarction identified. No midline shift, mass effect, or evidence of intracranial mass lesion. No ventriculomegaly. Patchy mostly subcortical cerebral white matter hypodensity is stable. No intracranial hemorrhage is identified. The inferior right frontal lobe region appears within normal limits.  IMPRESSION: No acute intracranial abnormality.  No intracranial hemorrhage identified, suspect artifact on the comparison.  Mild to moderate for age nonspecific white matter changes.   Electronically Signed   By: Lars Pinks M.D.   On: 04/08/2013 08:57   Ct Head Wo Contrast  04/07/2013   CLINICAL DATA:  Syncope post trauma  EXAM: CT HEAD WITHOUT CONTRAST  TECHNIQUE: Contiguous axial images were obtained from the base of the skull through the vertex without intravenous contrast.  COMPARISON:  None.  FINDINGS: There is mild diffuse atrophy. There is a small amount of subarachnoid hemorrhage in the right frontal lobe, best seen on axial slice 13. The no other focal hemorrhage is seen. There is no mass, extra-axial fluid collection, or midline shift. There is patchy small vessel disease in the centra semiovale bilaterally. No acute infarct is apparent.  Bony calvarium appears intact. The mastoid air cells are clear. There is moderate mucosal thickening in the right maxillary antrum. There is mild ethmoid air cell disease bilaterally.  IMPRESSION: Small amount of subarachnoid hemorrhage in the right frontal lobe. There is atrophy with periventricular small vessel disease. No mass. No subdural or epidural fluid. Paranasal sinus disease as noted above.  Critical Value/emergent results were called by telephone at the time of interpretation on 04/07/2013 at 4:39 PM to Dr. Christy Gentles , who verbally acknowledged these  results.   Electronically Signed   By: Lowella Grip M.D.   On: 04/07/2013 16:40   Dg Knee Complete 4 Views Left  04/07/2013   CLINICAL DATA:  Fall with laceration and pain.  EXAM: LEFT KNEE - COMPLETE 4+ VIEW  COMPARISON:  None.  FINDINGS: No evidence of fracture, dislocation or focal lesion. There is a tiny amount of joint fluid. Arterial calcification is noted.  IMPRESSION: Tiny amount of joint fluid. No evidence of fracture or significant degenerative change.   Electronically Signed   By: Nelson Chimes M.D.   On: 04/07/2013 16:23    Assessment & Plan:   Christopher Rubio was seen today for hypertension, annual exam and hyperlipidemia.  Diagnoses  and all orders for this visit:  Essential hypertension- his blood pressure is not well controlled, will restart the ACE inhibitor, I will check his labs today to screen for end organ damage and secondary causes of hypertension. -     Comprehensive metabolic panel; Future -     Urinalysis, Routine w reflex microscopic (not at [facility]); Future -     lisinopril (PRINIVIL,ZESTRIL) 20 MG tablet; Take 1 tablet (20 mg total) by mouth daily.  Need for prophylactic vaccination and inoculation against influenza -     Flu vaccine HIGH DOSE PF (Fluzone High dose)  PAD (peripheral artery disease)- he does not have any signs or symptoms related to this, he is due for follow up with vascular surgery. -     Ambulatory referral to Vascular Surgery -     ezetimibe (ZETIA) 10 MG tablet; Take 1 tablet (10 mg total) by mouth daily.  Kidney disease, chronic, stage III (GFR 30-59 ml/min)- I will try to get better control of his blood pressure and today will monitor his renal function -     Comprehensive metabolic panel; Future  Idiopathic gout, unspecified chronicity, unspecified site- he has had no recent flares of gout, will continue colchicine and allopurinol, will check his uric acid level today. -     Uric acid; Future  Deficiency anemia- I will recheck his CBC today.  Will address if needed. -     CBC with Differential/Platelet; Future  Hyperlipidemia with target LDL less than 100- unfortunately he will not take a statin due to prior side effects, I have asked him to restart Zetia, will check his lipid panel today. -     Lipid panel; Future -     Comprehensive metabolic panel; Future -     TSH; Future -     ezetimibe (ZETIA) 10 MG tablet; Take 1 tablet (10 mg total) by mouth daily.  Benign prostatic hypertrophy (BPH) with nocturia- I will check his PSA today to screen for prostate cancer. He is symptomatic with this so I have asked him to start Cialis 5 mg once a day as well. -     PSA; Future -     Urinalysis, Routine w reflex microscopic (not at [facility]); Future -     tadalafil (CIALIS) 5 MG tablet; Take 1 tablet (5 mg total) by mouth daily as needed for erectile dysfunction.   I have discontinued Mr. Procter's cilostazol, allopurinol, sildenafil, and hydrochlorothiazide. I am also having him start on tadalafil. Additionally, I am having him maintain his fish oil-omega-3 fatty acids, Vitamin D, hydroxypropyl methylcellulose / hypromellose, colchicine, lisinopril, and ezetimibe.  Meds ordered this encounter  Medications  . tadalafil (CIALIS) 5 MG tablet    Sig: Take 1 tablet (5 mg total) by mouth daily as needed for erectile dysfunction.    Dispense:  30 tablet    Refill:  11  . lisinopril (PRINIVIL,ZESTRIL) 20 MG tablet    Sig: Take 1 tablet (20 mg total) by mouth daily.    Dispense:  90 tablet    Refill:  3    Office visit prior to refill  . ezetimibe (ZETIA) 10 MG tablet    Sig: Take 1 tablet (10 mg total) by mouth daily.    Dispense:  90 tablet    Refill:  3     Follow-up: Return in about 3 months (around 03/12/2015).  Scarlette Calico, MD

## 2014-12-17 ENCOUNTER — Other Ambulatory Visit: Payer: Self-pay | Admitting: *Deleted

## 2014-12-17 ENCOUNTER — Encounter: Payer: Self-pay | Admitting: Internal Medicine

## 2014-12-17 DIAGNOSIS — I739 Peripheral vascular disease, unspecified: Secondary | ICD-10-CM

## 2014-12-26 ENCOUNTER — Telehealth: Payer: Self-pay

## 2014-12-26 NOTE — Telephone Encounter (Signed)
PA faxed awaiting response

## 2015-01-13 ENCOUNTER — Other Ambulatory Visit: Payer: Self-pay | Admitting: Family

## 2015-02-13 ENCOUNTER — Encounter: Payer: Self-pay | Admitting: Vascular Surgery

## 2015-02-17 ENCOUNTER — Ambulatory Visit (HOSPITAL_COMMUNITY)
Admission: RE | Admit: 2015-02-17 | Discharge: 2015-02-17 | Disposition: A | Discharge status: Home / Self Care | Payer: Medicare Other | Source: Ambulatory Visit | Attending: Vascular Surgery | Admitting: Vascular Surgery

## 2015-02-17 ENCOUNTER — Ambulatory Visit (INDEPENDENT_AMBULATORY_CARE_PROVIDER_SITE_OTHER): Payer: Medicare Other | Admitting: Vascular Surgery

## 2015-02-17 ENCOUNTER — Encounter: Payer: Self-pay | Admitting: Vascular Surgery

## 2015-02-17 VITALS — BP 171/72 | HR 51 | Temp 97.1°F | Resp 18 | Ht 66.0 in | Wt 148.0 lb

## 2015-02-17 DIAGNOSIS — I739 Peripheral vascular disease, unspecified: Secondary | ICD-10-CM | POA: Diagnosis not present

## 2015-02-17 DIAGNOSIS — I70219 Atherosclerosis of native arteries of extremities with intermittent claudication, unspecified extremity: Secondary | ICD-10-CM | POA: Diagnosis not present

## 2015-02-17 NOTE — Progress Notes (Signed)
Filed Vitals:   02/17/15 1016 02/17/15 1019  BP: 193/75 171/72  Pulse: 51 51  Temp: 97.1 F (36.2 C)   Resp: 18   Height: 5\' 6"  (1.676 m)   Weight: 148 lb (67.132 kg)   SpO2: 98%

## 2015-02-17 NOTE — Progress Notes (Signed)
[facility] [place]  Patient name: Christopher Rubio. MRN: [id number] DOB: Jan 24, 1934 Sex: male  REASON FOR CONSULT: Bilateral lower extremity claudication  HPI: Christopher Rubio. is a 81 y.o. male, who is here today for discussion of lower extremity symptoms. He is a very pleasant active gentleman who reports that for 5 or 6 years when he is doing a great deal of activity such as push mowing he has noted calf claudication symptoms. This relieved with rest. Over the last 5 years and become more frequent and with less activity. He now reports similar symptoms in his buttocks with activity which are relieved with rest. He has no arterial rest pain and has no lower extremity tissue loss. 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 Denies any prior cardiac difficulties. No history of stroke.  Past Medical History  Diagnosis Date  . Impotence of organic origin   . Contact dermatitis and other eczema, due to unspecified cause   . Benign neoplasm of colon   . Gout, unspecified   . Other and unspecified hyperlipidemia   . Unspecified essential hypertension   . Diverticulosis of colon (without mention of hemorrhage)   . Basal cell carcinoma of skin, site unspecified   . Extrinsic asthma, unspecified     Family History  Problem Relation Age of Onset  . Coronary artery disease Father 62    deceased  . Heart attack Father   . Stroke Mother   . Hypertension Mother   . Diabetes Neg Hx   . Cancer Neg Hx     colon, prostate    SOCIAL HISTORY: Social History   Social History  . Marital Status: Married    Spouse Name: N/A  . Number of Children: 2  . Years of Education: N/A   Occupational History  . retired Pharmacist, hospital    Social History Main Topics  . Smoking status: Current Every Day Smoker -- 1.00 packs/day for 60 years    Types: Cigarettes  . Smokeless tobacco: Never Used     Comment: explained LDCT; smokes more since wife died;   .  Alcohol Use: 6.0 oz/week    10 Cans of beer per week     Comment: Rarely drink anything  . Drug Use: No  . Sexual Activity: Not Currently   Other Topics Concern  . Not on file   Social History Narrative   HSG, [place], [place]; [employer]. Married '62. 2 daughters - '67, '70: no grandchildren. Retired-college Music therapist. Enjoys retirement: golf, gardening, woodworking. Marriage in good health             Allergies  Allergen Reactions  . Statins     myopathy  . Codeine Nausea And Vomiting and Other (See Comments)    dizziness  . Neomycin-Bacitracin Zn-Polymyx Other (See Comments)    Neosporin caused rash when covered with bandaid  . Penicillins   . Prednisone Other (See Comments)    Lips and tongue swelled up    Current Outpatient Prescriptions  Medication Sig Dispense Refill  . ezetimibe (ZETIA) 10 MG tablet Take 1 tablet (10 mg total) by mouth daily. 90 tablet 3  . fish oil-omega-3 fatty acids 1000 MG capsule Take 1 g by mouth daily.     . hydrochlorothiazide (MICROZIDE) 12.5 MG capsule TAKE 1 CAPSULE(12.5 MG) BY MOUTH DAILY 30 capsule 3  . lisinopril (PRINIVIL,ZESTRIL) 20 MG tablet Take 1 tablet (20 mg total) by mouth daily.  90 tablet 3  . Cholecalciferol (VITAMIN D) 2000 UNITS CAPS Take 2,000 Units by mouth daily.    . colchicine 0.6 MG tablet Take 1 tablet (0.6 mg total) by mouth daily as needed (gout). (Patient not taking: Reported on 02/17/2015) 30 tablet 5  . hydroxypropyl methylcellulose (ISOPTO TEARS) 2.5 % ophthalmic solution Place 1 drop into both eyes daily as needed for dry eyes.    . tadalafil (CIALIS) 5 MG tablet Take 1 tablet (5 mg total) by mouth daily as needed for erectile dysfunction. (Patient not taking: Reported on 02/17/2015) 30 tablet 11   No current facility-administered medications for this visit.    REVIEW OF SYSTEMS:  [X]  denotes positive finding, [ ]  denotes negative finding Cardiac  Comments:  Chest pain or chest  pressure:    Shortness of breath upon exertion:    Short of breath when lying flat:    Irregular heart rhythm:        Vascular    Pain in calf, thigh, or hip brought on by ambulation: x   Pain in feet at night that wakes you up from your sleep:     Blood clot in your veins:    Leg swelling:         Pulmonary    Oxygen at home:    Productive cough:     Wheezing:         Neurologic    Sudden weakness in arms or legs:     Sudden numbness in arms or legs:     Sudden onset of difficulty speaking or slurred speech:    Temporary loss of vision in one eye:     Problems with dizziness:         Gastrointestinal    Blood in stool:     Vomited blood:         Genitourinary    Burning when urinating:     Blood in urine:        Psychiatric    Major depression:         Hematologic    Bleeding problems:    Problems with blood clotting too easily:        Skin    Rashes or ulcers:        Constitutional    Fever or chills:      PHYSICAL EXAM: Filed Vitals:   02/17/15 1016 02/17/15 1019  BP: 193/75 171/72  Pulse: 51 51  Temp: 97.1 F (36.2 C)   Resp: 18   Height: 5\' 6"  (1.676 m)   Weight: 148 lb (67.132 kg)   SpO2: 98%     GENERAL: The patient is a well-nourished male, in no acute distress. The vital signs are documented above. CARDIAC: There is a regular rate and rhythm.  VASCULAR: 2+ radial pulses bilaterally, 1+ femoral pulses bilaterally and absent popliteal and distal pulses bilaterally. Carotid arteries without bruits bilaterally PULMONARY: There is good air exchange bilaterally without wheezing or rales. ABDOMEN: Soft and non-tender with normal pitched bowel sounds. No abdominal bruits MUSCULOSKELETAL: There are no major deformities or cyanosis. NEUROLOGIC: No focal weakness or paresthesias are detected. SKIN: There are no ulcers or rashes noted. PSYCHIATRIC: The patient has a normal affect.  DATA:  Noninvasive vascular laboratory studies from today were  reviewed with the patient. Reveals an ankle arm index of 0.53 on the right and 0.65 on the left  MEDICAL ISSUES: I very long discussion with patient regarding his lower extremity symptoms. This is classic  for lower extremity claudication. By physical exam this may well be aortoiliac and placed in the superficial femoral occlusive disease as well. I explained that this is not limb threatening and is not life-threatening. I did explain that there is a reasonable chance that this would be related to iliac occlusive disease which would be highly likely to be treated with the angioplasty and stenting with very good long-term durability. He reports that he is quite limited by this and wishes to consider this. I explained that this is completely elective we could proceed at any time at his convenience. Interestingly he has a severe fear of needles relating back to allergy testing is a 81 year old in 4. I did explain that he would require an IV to give some medication and that he could have file sedation for the procedure but this would be done under local with sedation and would have a needle stick for anesthesia in the groin for the aortogram. He understands this as well. He will consider proceeding and notify us if he wishes to proceed   Early, [place] [facility] [employer]: [phone]

## 2015-03-12 ENCOUNTER — Ambulatory Visit (INDEPENDENT_AMBULATORY_CARE_PROVIDER_SITE_OTHER): Payer: Medicare Other | Admitting: Internal Medicine

## 2015-03-12 ENCOUNTER — Encounter: Payer: Self-pay | Admitting: Internal Medicine

## 2015-03-12 VITALS — BP 138/78 | HR 57 | Temp 97.0°F | Resp 16 | Ht 66.0 in | Wt 148.0 lb

## 2015-03-12 DIAGNOSIS — N183 Chronic kidney disease, stage 3 unspecified: Secondary | ICD-10-CM

## 2015-03-12 DIAGNOSIS — I10 Essential (primary) hypertension: Secondary | ICD-10-CM | POA: Diagnosis not present

## 2015-03-12 NOTE — Patient Instructions (Signed)
Hypertension Hypertension, commonly called high blood pressure, is when the force of blood pumping through your arteries is too strong. Your arteries are the blood vessels that carry blood from your heart throughout your body. A blood pressure reading consists of a higher number over a lower number, such as 110/72. The higher number (systolic) is the pressure inside your arteries when your heart pumps. The lower number (diastolic) is the pressure inside your arteries when your heart relaxes. Ideally you want your blood pressure below 120/80. Hypertension forces your heart to work harder to pump blood. Your arteries may become narrow or stiff. Having untreated or uncontrolled hypertension can cause heart attack, stroke, kidney disease, and other problems. RISK FACTORS Some risk factors for high blood pressure are controllable. Others are not.  Risk factors you cannot control include:   Race. You may be at higher risk if you are African American.  Age. Risk increases with age.  Gender. Men are at higher risk than women before age 45 years. After age 65, women are at higher risk than men. Risk factors you can control include:  Not getting enough exercise or physical activity.  Being overweight.  Getting too much fat, sugar, calories, or salt in your diet.  Drinking too much alcohol. SIGNS AND SYMPTOMS Hypertension does not usually cause signs or symptoms. Extremely high blood pressure (hypertensive crisis) may cause headache, anxiety, shortness of breath, and nosebleed. DIAGNOSIS To check if you have hypertension, your health care provider will measure your blood pressure while you are seated, with your arm held at the level of your heart. It should be measured at least twice using the same arm. Certain conditions can cause a difference in blood pressure between your right and left arms. A blood pressure reading that is higher than normal on one occasion does not mean that you need treatment. If  it is not clear whether you have high blood pressure, you may be asked to return on a different day to have your blood pressure checked again. Or, you may be asked to monitor your blood pressure at home for 1 or more weeks. TREATMENT Treating high blood pressure includes making lifestyle changes and possibly taking medicine. Living a healthy lifestyle can help lower high blood pressure. You may need to change some of your habits. Lifestyle changes may include:  Following the DASH diet. This diet is high in fruits, vegetables, and whole grains. It is low in salt, red meat, and added sugars.  Keep your sodium intake below 2,300 mg per day.  Getting at least 30-45 minutes of aerobic exercise at least 4 times per week.  Losing weight if necessary.  Not smoking.  Limiting alcoholic beverages.  Learning ways to reduce stress. Your health care provider may prescribe medicine if lifestyle changes are not enough to get your blood pressure under control, and if one of the following is true:  You are 18-59 years of age and your systolic blood pressure is above 140.  You are 60 years of age or older, and your systolic blood pressure is above 150.  Your diastolic blood pressure is above 90.  You have diabetes, and your systolic blood pressure is over 140 or your diastolic blood pressure is over 90.  You have kidney disease and your blood pressure is above 140/90.  You have heart disease and your blood pressure is above 140/90. Your personal target blood pressure may vary depending on your medical conditions, your age, and other factors. HOME CARE INSTRUCTIONS    Have your blood pressure rechecked as directed by your health care provider.   Take medicines only as directed by your health care provider. Follow the directions carefully. Blood pressure medicines must be taken as prescribed. The medicine does not work as well when you skip doses. Skipping doses also puts you at risk for  problems.  Do not smoke.   Monitor your blood pressure at home as directed by your health care provider. SEEK MEDICAL CARE IF:   You think you are having a reaction to medicines taken.  You have recurrent headaches or feel dizzy.  You have swelling in your ankles.  You have trouble with your vision. SEEK IMMEDIATE MEDICAL CARE IF:  You develop a severe headache or confusion.  You have unusual weakness, numbness, or feel faint.  You have severe chest or abdominal pain.  You vomit repeatedly.  You have trouble breathing. MAKE SURE YOU:   Understand these instructions.  Will watch your condition.  Will get help right away if you are not doing well or get worse.   This information is not intended to replace advice given to you by your health care provider. Make sure you discuss any questions you have with your health care provider.   Document Released: 03/28/2005 Document Revised: 08/12/2014 Document Reviewed: 01/18/2013 Elsevier Interactive Patient Education 2016 [employer]  

## 2015-03-12 NOTE — Progress Notes (Signed)
Subjective:  Patient ID: Christopher Aid., male    DOB: Jan 24, 1934  Age: 81 y.o. MRN: [id number]  CC: Hypertension   HPI Christopher Rubio. presents for a blood pressure check. He feels well and offers no complaints.  Outpatient Prescriptions Prior to Visit  Medication Sig Dispense Refill  . colchicine 0.6 MG tablet Take 1 tablet (0.6 mg total) by mouth daily as needed (gout). 30 tablet 5  . ezetimibe (ZETIA) 10 MG tablet Take 1 tablet (10 mg total) by mouth daily. 90 tablet 3  . fish oil-omega-3 fatty acids 1000 MG capsule Take 1 g by mouth daily.     . hydrochlorothiazide (MICROZIDE) 12.5 MG capsule TAKE 1 CAPSULE(12.5 MG) BY MOUTH DAILY 30 capsule 3  . hydroxypropyl methylcellulose (ISOPTO TEARS) 2.5 % ophthalmic solution Place 1 drop into both eyes daily as needed for dry eyes.    Marland Kitchen lisinopril (PRINIVIL,ZESTRIL) 20 MG tablet Take 1 tablet (20 mg total) by mouth daily. 90 tablet 3  . tadalafil (CIALIS) 5 MG tablet Take 1 tablet (5 mg total) by mouth daily as needed for erectile dysfunction. 30 tablet 11  . Cholecalciferol (VITAMIN D) 2000 UNITS CAPS Take 2,000 Units by mouth daily.     No facility-administered medications prior to visit.    ROS Review of Systems  Constitutional: Negative.  Negative for fever, chills, diaphoresis, appetite change and fatigue.  HENT: Negative.   Eyes: Negative.   Respiratory: Negative.  Negative for cough, choking, chest tightness, shortness of breath and stridor.   Cardiovascular: Negative.  Negative for chest pain, palpitations and leg swelling.  Gastrointestinal: Negative.  Negative for nausea, vomiting, abdominal pain, diarrhea, constipation and blood in stool.  Endocrine: Negative.   Genitourinary: Negative.   Musculoskeletal: Negative.  Negative for myalgias, back pain, joint swelling and arthralgias.  Skin: Negative.  Negative for color change and rash.  Allergic/Immunologic: Negative.   Neurological: Negative.  Negative for  dizziness.  Hematological: Negative.  Negative for adenopathy. Does not bruise/bleed easily.  Psychiatric/Behavioral: Negative.  Negative for behavioral problems. The patient is not nervous/anxious.     Objective:  BP 138/78 mmHg  Pulse 57  Temp(Src) 97 F (36.1 C) (Oral)  Resp 16  Ht 5\' 6"  (1.676 m)  Wt 148 lb (67.132 kg)  BMI 23.90 kg/m2  SpO2 96%  BP Readings from Last 3 Encounters:  03/12/15 138/78  02/17/15 171/72  12/11/14 170/80    Wt Readings from Last 3 Encounters:  03/12/15 148 lb (67.132 kg)  02/17/15 148 lb (67.132 kg)  12/11/14 146 lb (66.225 kg)    Physical Exam  Constitutional: He is oriented to person, place, and time. No distress.  HENT:  Head: Normocephalic and atraumatic.  Mouth/Throat: Oropharynx is clear and moist. No oropharyngeal exudate.  Eyes: Conjunctivae are normal. Right eye exhibits no discharge. Left eye exhibits no discharge. No scleral icterus.  Neck: Normal range of motion. Neck supple. No JVD present. No tracheal deviation present. No thyromegaly present.  Cardiovascular: Normal rate, regular rhythm, normal heart sounds and intact distal pulses.  Exam reveals no gallop and no friction rub.   No murmur heard. Pulmonary/Chest: Effort normal and breath sounds normal. No stridor. No respiratory distress. He has no wheezes. He has no rales. He exhibits no tenderness.  Abdominal: Soft. Bowel sounds are normal. He exhibits no distension and no mass. There is no tenderness. There is no rebound and no guarding.  Musculoskeletal: Normal range of motion. He exhibits no edema  or tenderness.  Lymphadenopathy:    He has no cervical adenopathy.  Neurological: He is oriented to person, place, and time.  Skin: Skin is warm and dry. No rash noted. He is not diaphoretic. No erythema. No pallor.  Psychiatric: He has a normal mood and affect. His behavior is normal. Judgment and thought content normal.  Vitals reviewed.   Lab Results  Component Value Date    WBC 9.5 12/11/2014   HGB 14.8 12/11/2014   HCT 43.9 12/11/2014   PLT 243.0 12/11/2014   GLUCOSE 100* 12/11/2014   CHOL 187 12/11/2014   TRIG 170.0* 12/11/2014   HDL 32.80* 12/11/2014   LDLDIRECT 131.4 12/09/2013   LDLCALC 120* 12/11/2014   ALT 14 12/11/2014   AST 14 12/11/2014   NA 141 12/11/2014   K 5.0 12/11/2014   CL 107 12/11/2014   CREATININE 1.25 12/11/2014   BUN 27* 12/11/2014   CO2 26 12/11/2014   TSH 1.72 12/11/2014   PSA 2.40 12/11/2014    No results found.  Assessment & Plan:   Christopher Rubio was seen today for hypertension.  Diagnoses and all orders for this visit:  Essential hypertension- his blood pressure is well-controlled, will continue hydrochlorothiazide and lisinopril.  Kidney disease, chronic, stage III (GFR 30-59 ml/min)- his renal function is stable, will continue to control his blood pressure and continue the ACE inhibitor. He agrees to avoid anti-inflammatories due to the risk of complicating his renal situation and raising his blood pressure.  I am having Christopher Rubio maintain his fish oil-omega-3 fatty acids, Vitamin D, hydroxypropyl methylcellulose / hypromellose, colchicine, tadalafil, lisinopril, ezetimibe, and hydrochlorothiazide.  No orders of the defined types were placed in this encounter.     Follow-up: Return in about 6 months (around 09/10/2015).  Scarlette Calico, MD

## 2015-03-12 NOTE — Progress Notes (Signed)
Pre visit review using our clinic review tool, if applicable. No additional management support is needed unless otherwise documented below in the visit note. 

## 2015-03-27 ENCOUNTER — Other Ambulatory Visit: Payer: Self-pay | Admitting: Internal Medicine

## 2015-04-02 ENCOUNTER — Other Ambulatory Visit: Payer: Self-pay | Admitting: Internal Medicine

## 2015-05-23 ENCOUNTER — Other Ambulatory Visit: Payer: Self-pay | Admitting: Family

## 2015-06-19 ENCOUNTER — Encounter: Payer: Self-pay | Admitting: Vascular Surgery

## 2015-06-23 ENCOUNTER — Encounter: Payer: Self-pay | Admitting: Vascular Surgery

## 2015-06-23 ENCOUNTER — Ambulatory Visit (INDEPENDENT_AMBULATORY_CARE_PROVIDER_SITE_OTHER): Payer: Medicare Other | Admitting: Vascular Surgery

## 2015-06-23 VITALS — BP 99/50 | HR 65 | Temp 97.0°F | Resp 16 | Ht 66.0 in | Wt 149.0 lb

## 2015-06-23 DIAGNOSIS — I739 Peripheral vascular disease, unspecified: Secondary | ICD-10-CM | POA: Diagnosis not present

## 2015-06-23 MED ORDER — CILOSTAZOL 100 MG PO TABS: 100.0000 mg | ORAL_TABLET | Freq: Two times a day (BID) | ORAL | Status: DC

## 2015-06-23 NOTE — Progress Notes (Signed)
[facility]  Patient name: Christopher Rubio. MRN: [id number] DOB: 22-Mar-1934 Sex: male  REASON FOR VISIT: Claudication  HPI: Christopher Rubio. is a 82 y.o. male who presents for continued discussion of his bilateral lower extremity claudication. He was last seen in the office on 02/17/2015. At that time, Dr. Donnetta Hutching discussed elective treatment of iliac occlusive disease with angioplasty and stenting. The patient stated that he had a severe fear of needles and did not want to pursue any treatment at that time.  Today, he states that his symptoms are stable. He has not had any ulceration. He is concerned about the upcoming warmer months and his ability to do outdoor activities. His PCP had prescribed cilostazol 2 years ago and the patient did not take the medicine as prescribed. He wants to try this medication again. He continues to smoke. He is on aspirin and zetia.   Past Medical History  Diagnosis Date  . Impotence of organic origin   . Contact dermatitis and other eczema, due to unspecified cause   . Benign neoplasm of colon   . Gout, unspecified   . Other and unspecified hyperlipidemia   . Unspecified essential hypertension   . Diverticulosis of colon (without mention of hemorrhage)   . Basal cell carcinoma of skin, site unspecified   . Extrinsic asthma, unspecified     Family History  Problem Relation Age of Onset  . Coronary artery disease Father 40    deceased  . Heart attack Father   . Stroke Mother   . Hypertension Mother   . Diabetes Neg Hx   . Cancer Neg Hx     colon, prostate    SOCIAL HISTORY: Social History  Substance Use Topics  . Smoking status: Current Every Day Smoker -- 1.00 packs/day for 60 years    Types: Cigarettes  . Smokeless tobacco: Never Used     Comment: explained LDCT; smokes more since wife died;   . Alcohol Use: 6.0 oz/week    10 Cans of beer per week     Comment: Rarely drink anything     Allergies  Allergen Reactions  . Statins     myopathy  . Codeine Nausea And Vomiting and Other (See Comments)    dizziness  . Neomycin-Bacitracin Zn-Polymyx Other (See Comments)    Neosporin caused rash when covered with bandaid  . Penicillins   . Prednisone Other (See Comments)    Lips and tongue swelled up    Current Outpatient Prescriptions  Medication Sig Dispense Refill  . Cholecalciferol (VITAMIN D) 2000 UNITS CAPS Take 2,000 Units by mouth daily.    . colchicine 0.6 MG tablet TAKE 1 TABLET BY MOUTH DAILY AS NEEDED FOR GOUT 30 tablet 5  . ezetimibe (ZETIA) 10 MG tablet Take 1 tablet (10 mg total) by mouth daily. 90 tablet 3  . fish oil-omega-3 fatty acids 1000 MG capsule Take 1 g by mouth daily.     . hydrochlorothiazide (MICROZIDE) 12.5 MG capsule TAKE 1 CAPSULE(12.5 MG) BY MOUTH DAILY 90 capsule 1  . lisinopril (PRINIVIL,ZESTRIL) 20 MG tablet TAKE 1 TABLET DAILY. 30 tablet 5  . tadalafil (CIALIS) 5 MG tablet Take 1 tablet (5 mg total) by mouth daily as needed for erectile dysfunction. 30 tablet 11  . cilostazol (PLETAL) 100 MG tablet Take 1 tablet (100 mg total) by mouth 2 (two) times daily before a meal. 60 tablet 11  . hydroxypropyl methylcellulose (ISOPTO TEARS) 2.5 %  ophthalmic solution Place 1 drop into both eyes daily as needed for dry eyes. Reported on 06/23/2015     No current facility-administered medications for this visit.    REVIEW OF SYSTEMS:  [X]  denotes positive finding, [ ]  denotes negative finding Cardiac  Comments:  Chest pain or chest pressure:    Shortness of breath upon exertion:    Short of breath when lying flat:    Irregular heart rhythm:        Vascular    Pain in calf, thigh, or hip brought on by ambulation: x   Pain in feet at night that wakes you up from your sleep:     Blood clot in your veins:    Leg swelling:         Pulmonary    Oxygen at home:    Productive cough:     Wheezing:         Neurologic    Sudden weakness in arms  or legs:     Sudden numbness in arms or legs:     Sudden onset of difficulty speaking or slurred speech:    Temporary loss of vision in one eye:     Problems with dizziness:         Gastrointestinal    Blood in stool:     Vomited blood:         Genitourinary    Burning when urinating:     Blood in urine:        Psychiatric    Major depression:         Hematologic    Bleeding problems:    Problems with blood clotting too easily:        Skin    Rashes or ulcers:        Constitutional    Fever or chills:      PHYSICAL EXAM: Filed Vitals:   06/23/15 1350 06/23/15 1354  BP: 112/53 99/50  Pulse: 68 65  Temp: 97 F (36.1 C)   TempSrc: Oral   Resp: 16   Height: 5\' 6"  (1.676 m)   Weight: 149 lb (67.586 kg)   SpO2: 95%     GENERAL: The patient is a well-nourished male, in no acute distress. The vital signs are documented above. CARDIAC: There is a regular rate and rhythm.  VASCULAR: 1+ Femoral pulses bilaterally. Nonpalpable pedal pulses bilaterally. Feet are warm and pink PULMONARY: Non labored respiratory effort.  MUSCULOSKELETAL: There are no major deformities or cyanosis. NEUROLOGIC: No focal weakness or paresthesias are detected. SKIN: There are no ulcers or rashes noted. PSYCHIATRIC: The patient has a normal affect.  MEDICAL ISSUES: Bilateral lower extremity claudication  The patient describes continued leg claudication. His symptoms have been stable since his last office visit and he has not had any tissue loss. Dr. early discussed Angioplasty and stenting at the last office visit. The patient would like to avoid any procedures if possible. He has concerns about needles stemming from a procedure in the distant past.  Discussed that he is not at risk for limb loss. Advised that he start a walking program. The patient asked about cilostazol. Discussed that he may try this. The patient is agreeable with this. Greater than 3 minutes was spent on his smoking cessation  counseling. He is on maximal medical management with aspirin andzetia.  He will follow-up with office if he decides to pursue any procedures. Advised the patient to contact us if his symptoms worse or if he  develops any ulceration.  Virgina Jock, PA-C [facility]      I have examined the patient, reviewed and agree with above. Stable bilateral claudication. We'll continue to increase his walking program. We'll notify should he develop any tissue loss or worsening ischemic symptoms  Curt Jews, MD 06/23/2015 3:29 PM

## 2015-09-10 ENCOUNTER — Encounter: Payer: Self-pay | Admitting: Internal Medicine

## 2015-09-10 ENCOUNTER — Ambulatory Visit (INDEPENDENT_AMBULATORY_CARE_PROVIDER_SITE_OTHER): Payer: Medicare Other | Admitting: Internal Medicine

## 2015-09-10 ENCOUNTER — Other Ambulatory Visit (INDEPENDENT_AMBULATORY_CARE_PROVIDER_SITE_OTHER): Payer: Medicare Other

## 2015-09-10 VITALS — BP 140/78 | HR 60 | Temp 97.8°F | Resp 16 | Ht 66.0 in | Wt 148.0 lb

## 2015-09-10 DIAGNOSIS — N183 Chronic kidney disease, stage 3 unspecified: Secondary | ICD-10-CM

## 2015-09-10 DIAGNOSIS — I10 Essential (primary) hypertension: Secondary | ICD-10-CM

## 2015-09-10 DIAGNOSIS — M1000 Idiopathic gout, unspecified site: Secondary | ICD-10-CM

## 2015-09-10 DIAGNOSIS — N401 Enlarged prostate with lower urinary tract symptoms: Secondary | ICD-10-CM

## 2015-09-10 DIAGNOSIS — R351 Nocturia: Secondary | ICD-10-CM

## 2015-09-10 LAB — CBC WITH DIFFERENTIAL/PLATELET
BASOS PCT: 1.5 % (ref 0.0–3.0)
Basophils Absolute: 0.1 10*3/uL (ref 0.0–0.1)
EOS PCT: 4.6 % (ref 0.0–5.0)
Eosinophils Absolute: 0.4 10*3/uL (ref 0.0–0.7)
HEMATOCRIT: 39.9 % (ref 39.0–52.0)
HEMOGLOBIN: 13.5 g/dL (ref 13.0–17.0)
Lymphocytes Relative: 25.9 % (ref 12.0–46.0)
Lymphs Abs: 2.2 10*3/uL (ref 0.7–4.0)
MCHC: 33.7 g/dL (ref 30.0–36.0)
MCV: 87.9 fl (ref 78.0–100.0)
MONOS PCT: 11.3 % (ref 3.0–12.0)
Monocytes Absolute: 1 10*3/uL (ref 0.1–1.0)
Neutro Abs: 4.9 10*3/uL (ref 1.4–7.7)
Neutrophils Relative %: 56.7 % (ref 43.0–77.0)
PLATELETS: 252 10*3/uL (ref 150.0–400.0)
RBC: 4.55 Mil/uL (ref 4.22–5.81)
RDW: 14.4 % (ref 11.5–15.5)
WBC: 8.6 10*3/uL (ref 4.0–10.5)

## 2015-09-10 LAB — BASIC METABOLIC PANEL
BUN: 43 mg/dL — AB (ref 6–23)
CO2: 25 mEq/L (ref 19–32)
CREATININE: 1.79 mg/dL — AB (ref 0.40–1.50)
Calcium: 9.4 mg/dL (ref 8.4–10.5)
Chloride: 105 mEq/L (ref 96–112)
GFR: 39.07 mL/min — AB (ref >60.00)
GLUCOSE: 108 mg/dL — AB (ref 70–99)
Potassium: 4.9 mEq/L (ref 3.5–5.1)
Sodium: 137 mEq/L (ref 135–145)

## 2015-09-10 LAB — URIC ACID: URIC ACID, SERUM: 9 mg/dL — AB (ref 4.0–7.8)

## 2015-09-10 MED ORDER — FEBUXOSTAT 40 MG PO TABS: 80.0000 mg | ORAL_TABLET | Freq: Every day | ORAL | Status: DC

## 2015-09-10 NOTE — Progress Notes (Addendum)
Subjective:  Patient ID: Christopher Rubio., male    DOB: 16-Nov-1933  Age: 82 y.o. MRN: [id number]  CC: Hypertension   HPI [name]. presents for follow-up on hypertension. He tells me that his blood pressure has been well controlled on the combination of hydrochlorothiazide and lisinopril. He has had no headaches/chest pain/shortness of breath/blurred vision/dyspnea on exertion/edema/fatigue or palpitations.  He is weak urine stream and nocturia have improved significantly with a daily dose of Cialis.  He has had no recent episodes of gouty arthritis. He is not taking colchicine and quite a while.  Outpatient Prescriptions Prior to Visit  Medication Sig Dispense Refill  . Cholecalciferol (VITAMIN D) 2000 UNITS CAPS Take 2,000 Units by mouth daily.    . cilostazol (PLETAL) 100 MG tablet Take 1 tablet (100 mg total) by mouth 2 (two) times daily before a meal. 60 tablet 11  . colchicine 0.6 MG tablet TAKE 1 TABLET BY MOUTH DAILY AS NEEDED FOR GOUT 30 tablet 5  . ezetimibe (ZETIA) 10 MG tablet Take 1 tablet (10 mg total) by mouth daily. 90 tablet 3  . fish oil-omega-3 fatty acids 1000 MG capsule Take 1 g by mouth daily.     . hydrochlorothiazide (MICROZIDE) 12.5 MG capsule TAKE 1 CAPSULE(12.5 MG) BY MOUTH DAILY 90 capsule 1  . hydroxypropyl methylcellulose (ISOPTO TEARS) 2.5 % ophthalmic solution Place 1 drop into both eyes daily as needed for dry eyes. Reported on 06/23/2015    . lisinopril (PRINIVIL,ZESTRIL) 20 MG tablet TAKE 1 TABLET DAILY. 30 tablet 5  . tadalafil (CIALIS) 5 MG tablet Take 1 tablet (5 mg total) by mouth daily as needed for erectile dysfunction. 30 tablet 11   No facility-administered medications prior to visit.    ROS Review of Systems  Constitutional: Negative.  Negative for fever, chills, diaphoresis, appetite change and fatigue.  HENT: Negative.   Eyes: Negative.  Negative for visual disturbance.  Respiratory: Negative.  Negative for cough,  choking, chest tightness, shortness of breath and stridor.   Cardiovascular: Negative.  Negative for chest pain, palpitations and leg swelling.  Gastrointestinal: Negative.  Negative for nausea, vomiting, abdominal pain, diarrhea and constipation.  Endocrine: Negative.  Negative for polydipsia, polyphagia and polyuria.  Genitourinary: Negative.   Musculoskeletal: Negative.  Negative for myalgias, back pain, arthralgias and neck pain.  Skin: Negative.   Allergic/Immunologic: Negative.   Neurological: Negative.  Negative for dizziness, syncope, weakness and numbness.  Hematological: Negative.  Negative for adenopathy. Does not bruise/bleed easily.  Psychiatric/Behavioral: Negative.     Objective:  BP 140/78 mmHg  Pulse 60  Temp(Src) 97.8 F (36.6 C) (Oral)  Resp 16  Ht 5\' 6"  (1.676 m)  Wt 148 lb (67.132 kg)  BMI 23.90 kg/m2  SpO2 97%  BP Readings from Last 3 Encounters:  09/10/15 140/78  06/23/15 99/50  03/12/15 138/78    Wt Readings from Last 3 Encounters:  09/10/15 148 lb (67.132 kg)  06/23/15 149 lb (67.586 kg)  03/12/15 148 lb (67.132 kg)    Physical Exam  Constitutional: He is oriented to person, place, and time. No distress.  HENT:  Mouth/Throat: Oropharynx is clear and moist. No oropharyngeal exudate.  Eyes: Conjunctivae are normal. Right eye exhibits no discharge. Left eye exhibits no discharge. No scleral icterus.  Neck: Normal range of motion. Neck supple. No JVD present. No tracheal deviation present. No thyromegaly present.  Cardiovascular: Normal rate, regular rhythm, S1 normal, S2 normal and intact distal pulses.  Exam  reveals no gallop.   Murmur heard.  Decrescendo systolic murmur is present with a grade of 1/6   No diastolic murmur is present  Pulses:      Carotid pulses are 1+ on the right side, and 1+ on the left side.      Radial pulses are 1+ on the right side, and 1+ on the left side.       Femoral pulses are 1+ on the right side, and 1+ on the left  side.      Popliteal pulses are 1+ on the right side, and 1+ on the left side.       Dorsalis pedis pulses are 1+ on the right side, and 1+ on the left side.       Posterior tibial pulses are 1+ on the right side, and 1+ on the left side.  1/6 SEM over LSB  Pulmonary/Chest: Effort normal and breath sounds normal. No stridor. No respiratory distress. He has no wheezes. He has no rales. He exhibits no tenderness.  Abdominal: Soft. Bowel sounds are normal. He exhibits no distension and no mass. There is no tenderness. There is no rebound and no guarding.  Musculoskeletal: Normal range of motion. He exhibits no edema or tenderness.  Lymphadenopathy:    He has no cervical adenopathy.  Neurological: He is oriented to person, place, and time.  Skin: Skin is warm and dry. No rash noted. He is not diaphoretic. No erythema. No pallor.  Vitals reviewed.   Lab Results  Component Value Date   WBC 8.6 09/10/2015   HGB 13.5 09/10/2015   HCT 39.9 09/10/2015   PLT 252.0 09/10/2015   GLUCOSE 108* 09/10/2015   CHOL 187 12/11/2014   TRIG 170.0* 12/11/2014   HDL 32.80* 12/11/2014   LDLDIRECT 131.4 12/09/2013   LDLCALC 120* 12/11/2014   ALT 14 12/11/2014   AST 14 12/11/2014   NA 137 09/10/2015   K 4.9 09/10/2015   CL 105 09/10/2015   CREATININE 1.79* 09/10/2015   BUN 43* 09/10/2015   CO2 25 09/10/2015   TSH 1.72 12/11/2014   PSA 2.40 12/11/2014    No results found.  Assessment & Plan:   Firman was seen today for hypertension.  Diagnoses and all orders for this visit:  Essential hypertension- his blood pressure is well-controlled, electrolytes and renal function are stable. -     CBC with Differential/Platelet; Future -     Basic metabolic panel; Future  Idiopathic gout, unspecified chronicity, unspecified site- his uric acid level is up to 9 so I have asked him to start taking Uloric to reduce his uric acid level and to prevent episodes of gouty arthritis -     Basic metabolic panel;  Future -     Uric acid; Future -     febuxostat (ULORIC) 40 MG tablet; Take 2 tablets (80 mg total) by mouth daily.  Benign prostatic hypertrophy (BPH) with nocturia- improvement noted with daily Cialis  Kidney disease, chronic, stage III (GFR 30-59 ml/min)- renal function is stable, will continue to maintain good blood pressure control and he commits to avoiding nephrotoxic agents. -     CBC with Differential/Platelet; Future -     Basic metabolic panel; Future   I am having Mr. Southwood start on febuxostat. I am also having him maintain his fish oil-omega-3 fatty acids, Vitamin D, hydroxypropyl methylcellulose / hypromellose, tadalafil, ezetimibe, lisinopril, colchicine, hydrochlorothiazide, and cilostazol.  Meds ordered this encounter  Medications  . febuxostat (ULORIC) 40 MG  tablet    Sig: Take 2 tablets (80 mg total) by mouth daily.    Dispense:  90 tablet    Refill:  1     Follow-up: Return in about 6 months (around 03/11/2016).  Scarlette Calico, MD

## 2015-09-10 NOTE — Progress Notes (Signed)
Pre visit review using our clinic review tool, if applicable. No additional management support is needed unless otherwise documented below in the visit note. 

## 2015-09-10 NOTE — Patient Instructions (Signed)
Hypertension Hypertension, commonly called high blood pressure, is when the force of blood pumping through your arteries is too strong. Your arteries are the blood vessels that carry blood from your heart throughout your body. A blood pressure reading consists of a higher number over a lower number, such as 110/72. The higher number (systolic) is the pressure inside your arteries when your heart pumps. The lower number (diastolic) is the pressure inside your arteries when your heart relaxes. Ideally you want your blood pressure below 120/80. Hypertension forces your heart to work harder to pump blood. Your arteries may become narrow or stiff. Having untreated or uncontrolled hypertension can cause heart attack, stroke, kidney disease, and other problems. RISK FACTORS Some risk factors for high blood pressure are controllable. Others are not.  Risk factors you cannot control include:   Race. You may be at higher risk if you are African American.  Age. Risk increases with age.  Gender. Men are at higher risk than women before age 45 years. After age 65, women are at higher risk than men. Risk factors you can control include:  Not getting enough exercise or physical activity.  Being overweight.  Getting too much fat, sugar, calories, or salt in your diet.  Drinking too much alcohol. SIGNS AND SYMPTOMS Hypertension does not usually cause signs or symptoms. Extremely high blood pressure (hypertensive crisis) may cause headache, anxiety, shortness of breath, and nosebleed. DIAGNOSIS To check if you have hypertension, your health care provider will measure your blood pressure while you are seated, with your arm held at the level of your heart. It should be measured at least twice using the same arm. Certain conditions can cause a difference in blood pressure between your right and left arms. A blood pressure reading that is higher than normal on one occasion does not mean that you need treatment. If  it is not clear whether you have high blood pressure, you may be asked to return on a different day to have your blood pressure checked again. Or, you may be asked to monitor your blood pressure at home for 1 or more weeks. TREATMENT Treating high blood pressure includes making lifestyle changes and possibly taking medicine. Living a healthy lifestyle can help lower high blood pressure. You may need to change some of your habits. Lifestyle changes may include:  Following the DASH diet. This diet is high in fruits, vegetables, and whole grains. It is low in salt, red meat, and added sugars.  Keep your sodium intake below 2,300 mg per day.  Getting at least 30-45 minutes of aerobic exercise at least 4 times per week.  Losing weight if necessary.  Not smoking.  Limiting alcoholic beverages.  Learning ways to reduce stress. Your health care provider may prescribe medicine if lifestyle changes are not enough to get your blood pressure under control, and if one of the following is true:  You are 18-59 years of age and your systolic blood pressure is above 140.  You are 60 years of age or older, and your systolic blood pressure is above 150.  Your diastolic blood pressure is above 90.  You have diabetes, and your systolic blood pressure is over 140 or your diastolic blood pressure is over 90.  You have kidney disease and your blood pressure is above 140/90.  You have heart disease and your blood pressure is above 140/90. Your personal target blood pressure may vary depending on your medical conditions, your age, and other factors. HOME CARE INSTRUCTIONS    Have your blood pressure rechecked as directed by your health care provider.   Take medicines only as directed by your health care provider. Follow the directions carefully. Blood pressure medicines must be taken as prescribed. The medicine does not work as well when you skip doses. Skipping doses also puts you at risk for  problems.  Do not smoke.   Monitor your blood pressure at home as directed by your health care provider. SEEK MEDICAL CARE IF:   You think you are having a reaction to medicines taken.  You have recurrent headaches or feel dizzy.  You have swelling in your ankles.  You have trouble with your vision. SEEK IMMEDIATE MEDICAL CARE IF:  You develop a severe headache or confusion.  You have unusual weakness, numbness, or feel faint.  You have severe chest or abdominal pain.  You vomit repeatedly.  You have trouble breathing. MAKE SURE YOU:   Understand these instructions.  Will watch your condition.  Will get help right away if you are not doing well or get worse.   This information is not intended to replace advice given to you by your health care provider. Make sure you discuss any questions you have with your health care provider.   Document Released: 03/28/2005 Document Revised: 08/12/2014 Document Reviewed: 01/18/2013 Elsevier Interactive Patient Education 2016 [employer]  

## 2015-09-14 ENCOUNTER — Telehealth: Payer: Self-pay

## 2015-09-14 NOTE — Telephone Encounter (Signed)
PA initiated via CoverMyMeds key [facility]

## 2015-09-14 NOTE — Telephone Encounter (Signed)
APPROVED through 04/10/2016 

## 2015-11-12 ENCOUNTER — Encounter: Payer: Self-pay | Admitting: Vascular Surgery

## 2015-11-17 NOTE — Progress Notes (Addendum)
Subjective:   Christopher Newcomer. is a 82 y.o. male who presents for Medicare Annual/Subsequent preventive examination.  HRA assessment completed during visit;   The Patient was informed that this wellness visit is to identify risk and educate on how to reduce risk for increase disease through lifestyle changes.  The patient verbalized understanding that any voiced medical issues still need to be directed to their physician.    Problem list reviewed and are being managed medically HTN; will check bp at home periodically  PAD; poor circulation; followed by Dr. Donnetta Hutching ETOH: rare Hyperlipidemia; 2016; HDL 32; LDL 120; Trig 170/ given information on numbers  Glucose fasting 108 09/2015 CKD/ GFR 39  Current tobacco use/ "inhales very little" Etoh; Basically quit  Stress; Lost wife one year ago  Diet; states eating habits have changed 03/2015 148 and now 142;  Grows all of his vegetables Freeze and can  Breakfast; nothing Lunch; leftovers; cereal and fruit Supper; meat and vegetable; chicken wings Pork chop   Dental; has dental work regularly   Exercise; Golf and yoga per the recommendation of hospice. He does like it and plans to continue. Still plays golf x 1 per week; has friends Yoga; go 2 times a week; class; studio; [employer]  Agrees to start doing strength training with weights as he feels he is losing muscle tone Reviewed exercises and positioning   Pain 1-10; pain in ankles on occasion  Falls; no falls  Cognitive issues No memory issues; still managing check book No issues with daily living at this time  Safety;  Reviewed for home; one level 2 children (2 dtrs)  Live in [place]; yes No firearms in home Sun protection when outside; when he plays golf Car accidents; no   Depression:  On rare occasions; "waves of grief"  No issues interfering with life   One level home; one level  ADL's and IADLs/ does all the housekeeping; laundry Has tried  housekeepers but has not found one yet   Ophthalmology: Dr. Satira Sark;   Hearing; some loss (2000hz  last year) but not a problem at present  Cardiac Risk Factors include: advanced age (>77men, >39 women);dyslipidemia;male gender;smoking/ tobacco exposure Sees annual        Objective:    Vitals: BP 136/70   Pulse 63   Ht 5\' 6"  (1.676 m)   Wt 142 lb 12 oz (64.8 kg)   SpO2 98%   BMI 23.04 kg/m   Body mass index is 23.04 kg/m.  Tobacco History  Smoking Status  . Current Every Day Smoker  . Packs/day: 1.00  . Years: 60.00  . Types: Cigarettes  Smokeless Tobacco  . Never Used    Comment: explained LDCT; smokes more since wife died;      Ready to quit: Yes Counseling given: Not Answered The patient has no desire to quit. Did discuss LDCT and check for AAA at some point Osteopenic; does take Vit D   Past Medical History:  Diagnosis Date  . Basal cell carcinoma of skin, site unspecified   . Benign neoplasm of colon   . Contact dermatitis and other eczema, due to unspecified cause   . Diverticulosis of colon (without mention of hemorrhage)   . Extrinsic asthma, unspecified   . Gout, unspecified   . Impotence of organic origin   . Other and unspecified hyperlipidemia   . Unspecified essential hypertension    Past Surgical History:  Procedure Laterality Date  . TONSILLECTOMY  Family History  Problem Relation Age of Onset  . Coronary artery disease Father 20    deceased  . Heart attack Father   . Stroke Mother   . Hypertension Mother   . Diabetes Neg Hx   . Cancer Neg Hx     colon, prostate   History  Sexual Activity  . Sexual activity: Not Currently    Outpatient Encounter Prescriptions as of 11/18/2015  Medication Sig  . Cholecalciferol (VITAMIN D) 2000 UNITS CAPS Take 2,000 Units by mouth daily.  . cilostazol (PLETAL) 100 MG tablet Take 1 tablet (100 mg total) by mouth 2 (two) times daily before a meal.  . colchicine 0.6 MG tablet TAKE 1 TABLET BY  MOUTH DAILY AS NEEDED FOR GOUT  . ezetimibe (ZETIA) 10 MG tablet Take 1 tablet (10 mg total) by mouth daily.  . febuxostat (ULORIC) 40 MG tablet Take 2 tablets (80 mg total) by mouth daily.  . fish oil-omega-3 fatty acids 1000 MG capsule Take 1 g by mouth daily.   . hydrochlorothiazide (MICROZIDE) 12.5 MG capsule TAKE 1 CAPSULE(12.5 MG) BY MOUTH DAILY  . lisinopril (PRINIVIL,ZESTRIL) 20 MG tablet TAKE 1 TABLET DAILY.  . tadalafil (CIALIS) 5 MG tablet Take 1 tablet (5 mg total) by mouth daily as needed for erectile dysfunction.  . hydroxypropyl methylcellulose (ISOPTO TEARS) 2.5 % ophthalmic solution Place 1 drop into both eyes daily as needed for dry eyes. Reported on 06/23/2015   No facility-administered encounter medications on file as of 11/18/2015.     Activities of Daily Living In your present state of health, do you have any difficulty performing the following activities: 11/18/2015 12/12/2014  Hearing? N N  Vision? N N  Difficulty concentrating or making decisions? N -  Walking or climbing stairs? N N  Dressing or bathing? N N  Doing errands, shopping? N N  Preparing Food and eating ? N -  Using the Toilet? N -  In the past six months, have you accidently leaked urine? N -  Do you have problems with loss of bowel control? N -  Managing your Medications? N -  Managing your Finances? N -  Housekeeping or managing your Housekeeping? N -  Some recent data might be hidden    Patient Care Team: [name], MD as PCP - General (Internal Medicine)   Assessment:    Taking Vit D for mild osteopenia  No falls; agreed to start strength training; upper body Continues mowing and does yard work; as well as house work   BP elevated at times; 136/70 and later checked x 2 at 156/70  Agrees to check BP at home several times a week at different times   Exercise Activities and Dietary recommendations Current Exercise Habits: Structured exercise class, Time (Minutes): 60, Frequency  (Times/Week): 3, Weekly Exercise (Minutes/Week): 180, Intensity: Moderate  Goals    . Exercise 150 minutes per week (moderate activity)          Will start weight program at home Loss of muscle tone; recommended exercise with the 5 lb weights he has or can get exercise rubber bands or stretch bands      . patient          Will add some calories to diet in the am Peanut butter; banana; cereal; blueberries  Gain weight Add honey; Eat nuts; (mixed) almonds or walnuts  Ice cream;  Protein bar       . will continue yoga  Will continue to embrace yoga and experiment with other forms of relaxation      Fall Risk Fall Risk  11/18/2015 12/12/2014 12/03/2014 12/10/2013  Falls in the past year? No No No No   Depression Screen PHQ 2/9 Scores 11/18/2015 12/12/2014 12/03/2014 12/10/2013  PHQ - 2 Score 0 0 0 0    Cognitive Testing MMSE - Mini Mental State Exam 11/18/2015  Not completed: (No Data)    Immunization History  Administered Date(s) Administered  . Influenza, High Dose Seasonal PF 12/11/2014  . Influenza,inj,Quad PF,36+ Mos 02/25/2013, 12/30/2013  . Pneumococcal Conjugate-13 03/13/2014  . Pneumococcal Polysaccharide-23 12/26/2012  . Td 08/10/2008  . Zoster 02/13/2010   Screening Tests Health Maintenance  Topic Date Due  . INFLUENZA VACCINE  11/10/2015  . TETANUS/TDAP  08/11/2018  . ZOSTAVAX  Completed  . PNA vac Low Risk Adult  Completed      Plan:     Will make apt with dr. [name] for diarrhea x 2 per day x 2 the last 2 months   Check BP at home and take cuff to the md this afternoon. Check for accuracy;  Can have Low does CT to check for lung cancer if you chose to do so Can also have Abdominal Aortic Aneurysm study;  Discuss with Dr. [name] next visit if these have not been done;  Will evaluate any further weight loss; will try to eat more; breakfast  Agreed to start using weights for increase upper body strength.   During the course of the visit the  patient was educated and counseled about the following appropriate screening and preventive services:   Vaccines to include Pneumoccal, Influenza, Hepatitis B, Td, Zostavax, HCV/ up to date  Electrocardiogram- 03/30/2013  Cardiovascular Disease -HTN  Colorectal cancer screening 08/2013/ aged out   Diabetes screening  / neg  Prostate Cancer Screening; deferred   Glaucoma screening/ vision checks with Dr. Satira Sark  Nutrition counseling; lost weight recent; will try to eat more calories; nuts; peanut butter; etc   Smoking cessation counseling (still smoking last year) No interest in quitting at this time.   Patient Instructions (the written plan) was given to the patient.    [id number], RN  11/18/2015  Medical screening examination/treatment/procedure(s) were performed by non-physician practitioner and as supervising physician I was immediately available for consultation/collaboration. I agree with above. Scarlette Calico, MD

## 2015-11-18 ENCOUNTER — Encounter: Payer: Self-pay | Admitting: Vascular Surgery

## 2015-11-18 ENCOUNTER — Ambulatory Visit (INDEPENDENT_AMBULATORY_CARE_PROVIDER_SITE_OTHER): Payer: Medicare Other

## 2015-11-18 ENCOUNTER — Ambulatory Visit (INDEPENDENT_AMBULATORY_CARE_PROVIDER_SITE_OTHER): Payer: Medicare Other | Admitting: Vascular Surgery

## 2015-11-18 VITALS — BP 132/64 | HR 67 | Ht 66.0 in | Wt 142.5 lb

## 2015-11-18 VITALS — BP 136/70 | HR 63 | Ht 66.0 in | Wt 142.8 lb

## 2015-11-18 DIAGNOSIS — Z0000 Encounter for general adult medical examination without abnormal findings: Secondary | ICD-10-CM

## 2015-11-18 DIAGNOSIS — I70219 Atherosclerosis of native arteries of extremities with intermittent claudication, unspecified extremity: Secondary | ICD-10-CM | POA: Diagnosis not present

## 2015-11-18 NOTE — Progress Notes (Signed)
[facility]  Patient name: Christopher Rubio. MRN: [id number] DOB: [birth year] Sex: male  REASON FOR VISIT: Continued discussion of lower extremity arterial insufficiency  HPI: Christopher Rubio. is a 82 y.o. male here today for continued follow-up. 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 He also has many skin allergies. Recently began wearing socks with copper impregnation and noted to have erythema on the tops of both of his feet. This has improved with some topical ointment from prior dermatologic treatment. He was concerned this may be related to his arterial insufficiency. He does continue to have bilateral buttock claudication but that this is tolerable.  Past Medical History:  Diagnosis Date  . Basal cell carcinoma of skin, site unspecified   . Benign neoplasm of colon   . Contact dermatitis and other eczema, due to unspecified cause   . Diverticulosis of colon (without mention of hemorrhage)   . Extrinsic asthma, unspecified   . Gout, unspecified   . Impotence of organic origin   . Other and unspecified hyperlipidemia   . Unspecified essential hypertension     Family History  Problem Relation Age of Onset  . Coronary artery disease Father 58    deceased  . Heart attack Father   . Stroke Mother   . Hypertension Mother   . Diabetes Neg Hx   . Cancer Neg Hx     colon, prostate    SOCIAL HISTORY: Social History  Substance Use Topics  . Smoking status: Current Every Day Smoker    Packs/day: 1.00    Years: 60.00    Types: Cigarettes  . Smokeless tobacco: Never Used     Comment: explained LDCT; smokes more since wife died;   . Alcohol use 6.0 oz/week    10 Cans of beer per week     Comment: Rarely drink anything    Allergies  Allergen Reactions  . Statins     myopathy  . Codeine Nausea And Vomiting and Other (See Comments)    dizziness  . Neomycin-Bacitracin Zn-Polymyx Other (See  Comments)    Neosporin caused rash when covered with bandaid  . Penicillins   . Prednisone Other (See Comments)    Lips and tongue swelled up    Current Outpatient Prescriptions  Medication Sig Dispense Refill  . Cholecalciferol (VITAMIN D) 2000 UNITS CAPS Take 2,000 Units by mouth daily.    . cilostazol (PLETAL) 100 MG tablet Take 1 tablet (100 mg total) by mouth 2 (two) times daily before a meal. 60 tablet 11  . colchicine 0.6 MG tablet TAKE 1 TABLET BY MOUTH DAILY AS NEEDED FOR GOUT 30 tablet 5  . ezetimibe (ZETIA) 10 MG tablet Take 1 tablet (10 mg total) by mouth daily. 90 tablet 3  . febuxostat (ULORIC) 40 MG tablet Take 2 tablets (80 mg total) by mouth daily. 90 tablet 1  . fish oil-omega-3 fatty acids 1000 MG capsule Take 1 g by mouth daily.     . hydrochlorothiazide (MICROZIDE) 12.5 MG capsule TAKE 1 CAPSULE(12.5 MG) BY MOUTH DAILY 90 capsule 1  . hydroxypropyl methylcellulose (ISOPTO TEARS) 2.5 % ophthalmic solution Place 1 drop into both eyes daily as needed for dry eyes. Reported on 06/23/2015    . ibuprofen (ADVIL,MOTRIN) 200 MG tablet Take 200 mg by mouth every 6 (six) hours as needed.    Marland Kitchen lisinopril (PRINIVIL,ZESTRIL) 20 MG tablet TAKE 1 TABLET DAILY. 30 tablet 5  . tadalafil (  CIALIS) 5 MG tablet Take 1 tablet (5 mg total) by mouth daily as needed for erectile dysfunction. 30 tablet 11   No current facility-administered medications for this visit.     REVIEW OF SYSTEMS:  [X]  denotes positive finding, [ ]  denotes negative finding Cardiac  Comments:  Chest pain or chest pressure:    Shortness of breath upon exertion:    Short of breath when lying flat:    Irregular heart rhythm:        Vascular    Pain in calf, thigh, or hip brought on by ambulation: x   Pain in feet at night that wakes you up from your sleep:     Blood clot in your veins:    Leg swelling:         Pulmonary    Oxygen at home:    Productive cough:     Wheezing:         Neurologic    Sudden  weakness in arms or legs:     Sudden numbness in arms or legs:     Sudden onset of difficulty speaking or slurred speech:    Temporary loss of vision in one eye:     Problems with dizziness:         Gastrointestinal    Blood in stool:     Vomited blood:         Genitourinary    Burning when urinating:     Blood in urine:        Psychiatric    Major depression:         Hematologic    Bleeding problems:    Problems with blood clotting too easily:        Skin    Rashes or ulcers: x       Constitutional    Fever or chills:      PHYSICAL EXAM: Vitals:   11/18/15 1424  BP: 132/64  Pulse: 67  SpO2: 98%  Weight: 142 lb 8 oz (64.6 kg)  Height: 5\' 6"  (1.676 m)    GENERAL: The patient is a well-nourished male, in no acute distress. The vital signs are documented above. VASCULAR: Palpable radial and absent pedal pulses PULMONARY: There is good air exchange  MUSCULOSKELETAL: There are no major deformities or cyanosis. NEUROLOGIC: No focal weakness or paresthesias are detected. SKIN: Does have some mild erythema on the dorsum of his feet but no open ulcers PSYCHIATRIC: The patient has a normal affect.    MEDICAL ISSUES: Table overall. Again discussed the option of arteriography but currently is tolerating this level of ischemia. Would recommend observation only. Do not feel that his skin changes are related to arterial insufficiency. He knows to notify should he develop any new tissue loss. Will see [place] again on an as-needed basis    Taiven Greenley [facility] [phone]

## 2015-11-18 NOTE — Patient Instructions (Addendum)
Christopher Rubio , Thank you for taking time to come for your Medicare Wellness Visit. I appreciate your ongoing commitment to your health goals. Please review the following plan we discussed and let me know if I can assist you in the future.   Check BP at home and take to the md this afternoon. Check for accuracy;  Can have Low does CT to check for lung cancer if you chose to do so Can also have Abdominal Aortic Aneurysm study;  Discuss with Dr. [name] next visit if these have not been done;  Try to gain some weight!!!!  As we discussed   These are the goals we discussed: Goals    . Exercise 150 minutes per week (moderate activity)          Will start weight program at home Loss of muscle tone; recommended exercise with the 5 lb weights he has or can get exercise rubber bands or stretch bands      . patient          Will add some calories to diet in the am Peanut butter; banana; cereal; blueberries  Gain weight Add honey; Eat nuts; (mixed) almonds or walnuts  Ice cream;  Protein bar       . will continue yoga          Will continue to embrace yoga and experiment with other forms of relaxation       This is a list of the screening recommended for you and due dates:  Health Maintenance  Topic Date Due  . Flu Shot  11/10/2015  . Tetanus Vaccine  08/11/2018  . Shingles Vaccine  Completed  . Pneumonia vaccines  Completed       Fall Prevention in the Home  Falls can cause injuries. They can happen to people of all ages. There are many things you can do to make your home safe and to help prevent falls.  WHAT CAN I DO ON THE OUTSIDE OF MY HOME?  Regularly fix the edges of walkways and driveways and fix any cracks.  Remove anything that might make you trip as you walk through a door, such as a raised step or threshold.  Trim any bushes or trees on the path to your home.  Use bright outdoor lighting.  Clear any walking paths of anything that might make someone  trip, such as rocks or tools.  Regularly check to see if handrails are loose or broken. Make sure that both sides of any steps have handrails.  Any raised decks and porches should have guardrails on the edges.  Have any leaves, snow, or ice cleared regularly.  Use sand or salt on walking paths during winter.  Clean up any spills in your garage right away. This includes oil or grease spills. WHAT CAN I DO IN THE BATHROOM?   Use night lights.  Install grab bars by the toilet and in the tub and shower. Do not use towel bars as grab bars.  Use non-skid mats or decals in the tub or shower.  If you need to sit down in the shower, use a plastic, non-slip stool.  Keep the floor dry. Clean up any water that spills on the floor as soon as it happens.  Remove soap buildup in the tub or shower regularly.  Attach bath mats securely with double-sided non-slip rug tape.  Do not have throw rugs and other things on the floor that can make you trip. WHAT CAN I DO IN  THE BEDROOM?  Use night lights.  Make sure that you have a light by your bed that is easy to reach.  Do not use any sheets or blankets that are too big for your bed. They should not hang down onto the floor.  Have a firm chair that has side arms. You can use this for support while you get dressed.  Do not have throw rugs and other things on the floor that can make you trip. WHAT CAN I DO IN THE KITCHEN?  Clean up any spills right away.  Avoid walking on wet floors.  Keep items that you use a lot in easy-to-reach places.  If you need to reach something above you, use a strong step stool that has a grab bar.  Keep electrical cords out of the way.  Do not use floor polish or wax that makes floors slippery. If you must use wax, use non-skid floor wax.  Do not have throw rugs and other things on the floor that can make you trip. WHAT CAN I DO WITH MY STAIRS?  Do not leave any items on the stairs.  Make sure that there  are handrails on both sides of the stairs and use them. Fix handrails that are broken or loose. Make sure that handrails are as long as the stairways.  Check any carpeting to make sure that it is firmly attached to the stairs. Fix any carpet that is loose or worn.  Avoid having throw rugs at the top or bottom of the stairs. If you do have throw rugs, attach them to the floor with carpet tape.  Make sure that you have a light switch at the top of the stairs and the bottom of the stairs. If you do not have them, ask someone to add them for you. WHAT ELSE CAN I DO TO HELP PREVENT FALLS?  Wear shoes that:  Do not have high heels.  Have rubber bottoms.  Are comfortable and fit you well.  Are closed at the toe. Do not wear sandals.  If you use a stepladder:  Make sure that it is fully opened. Do not climb a closed stepladder.  Make sure that both sides of the stepladder are locked into place.  Ask someone to hold it for you, if possible.  Clearly mark and make sure that you can see:  Any grab bars or handrails.  First and last steps.  Where the edge of each step is.  Use tools that help you move around (mobility aids) if they are needed. These include:  Canes.  Walkers.  Scooters.  Crutches.  Turn on the lights when you go into a dark area. Replace any light bulbs as soon as they burn out.  Set up your furniture so you have a clear path. Avoid moving your furniture around.  If any of your floors are uneven, fix them.  If there are any pets around you, be aware of where they are.  Review your medicines with your doctor. Some medicines can make you feel dizzy. This can increase your chance of falling. Ask your doctor what other things that you can do to help prevent falls.   This information is not intended to replace advice given to you by your health care provider. Make sure you discuss any questions you have with your health care provider.   Document Released:  01/22/2009 Document Revised: 08/12/2014 Document Reviewed: 05/02/2014 Elsevier Interactive Patient Education 2016 [employer] Maintenance, Male A healthy  lifestyle and preventative care can promote health and wellness.  Maintain regular health, dental, and eye exams.  Eat a healthy diet. Foods like vegetables, fruits, whole grains, low-fat dairy products, and lean protein foods contain the nutrients you need and are low in calories. Decrease your intake of foods high in solid fats, added sugars, and salt. Get information about a proper diet from your health care provider, if necessary.  Regular physical exercise is one of the most important things you can do for your health. Most adults should get at least 150 minutes of moderate-intensity exercise (any activity that increases your heart rate and causes you to sweat) each week. In addition, most adults need muscle-strengthening exercises on 2 or more days a week.   Maintain a healthy weight. The body mass index (BMI) is a screening tool to identify possible weight problems. It provides an estimate of body fat based on height and weight. Your health care provider can find your BMI and can help you achieve or maintain a healthy weight. For males 20 years and older:  A BMI below 18.5 is considered underweight.  A BMI of 18.5 to 24.9 is normal.  A BMI of 25 to 29.9 is considered overweight.  A BMI of 30 and above is considered obese.  Maintain normal blood lipids and cholesterol by exercising and minimizing your intake of saturated fat. Eat a balanced diet with plenty of fruits and vegetables. Blood tests for lipids and cholesterol should begin at age 48 and be repeated every 5 years. If your lipid or cholesterol levels are high, you are over age 70, or you are at high risk for heart disease, you may need your cholesterol levels checked more frequently.Ongoing high lipid and cholesterol levels should be treated with medicines if diet  and exercise are not working.  If you smoke, find out from your health care provider how to quit. If you do not use tobacco, do not start.  Lung cancer screening is recommended for adults aged 29-80 years who are at high risk for developing lung cancer because of a history of smoking. A yearly low-dose CT scan of the lungs is recommended for people who have at least a 30-pack-year history of smoking and are current smokers or have quit within the past 15 years. A pack year of smoking is smoking an average of 1 pack of cigarettes a day for 1 year (for example, a 30-pack-year history of smoking could mean smoking 1 pack a day for 30 years or 2 packs a day for 15 years). Yearly screening should continue until the smoker has stopped smoking for at least 15 years. Yearly screening should be stopped for people who develop a health problem that would prevent them from having lung cancer treatment.  If you choose to drink alcohol, do not have more than 2 drinks per day. One drink is considered to be 12 oz (360 mL) of beer, 5 oz (150 mL) of wine, or 1.5 oz (45 mL) of liquor.  Avoid the use of street drugs. Do not share needles with anyone. Ask for help if you need support or instructions about stopping the use of drugs.  High blood pressure causes heart disease and increases the risk of stroke. High blood pressure is more likely to develop in:  People who have blood pressure in the end of the normal range (100-139/85-89 mm Hg).  People who are overweight or obese.  People who are African American.  If you are 18-39 years  of age, have your blood pressure checked every 3-5 years. If you are 57 years of age or older, have your blood pressure checked every year. You should have your blood pressure measured twice--once when you are at a hospital or clinic, and once when you are not at a hospital or clinic. Record the average of the two measurements. To check your blood pressure when you are not at a hospital or  clinic, you can use:  An automated blood pressure machine at a pharmacy.  A home blood pressure monitor.  If you are 54-30 years old, ask your health care provider if you should take aspirin to prevent heart disease.  Diabetes screening involves taking a blood sample to check your fasting blood sugar level. This should be done once every 3 years after age 12 if you are at a normal weight and without risk factors for diabetes. Testing should be considered at a younger age or be carried out more frequently if you are overweight and have at least 1 risk factor for diabetes.  Colorectal cancer can be detected and often prevented. Most routine colorectal cancer screening begins at the age of 79 and continues through age 76. However, your health care provider may recommend screening at an earlier age if you have risk factors for colon cancer. On a yearly basis, your health care provider may provide home test kits to check for hidden blood in the stool. A small camera at the end of a tube may be used to directly examine the colon (sigmoidoscopy or colonoscopy) to detect the earliest forms of colorectal cancer. Talk to your health care provider about this at age 45 when routine screening begins. A direct exam of the colon should be repeated every 5-10 years through age 36, unless early forms of precancerous polyps or small growths are found.  People who are at an increased risk for hepatitis B should be screened for this virus. You are considered at high risk for hepatitis B if:  You were born in a country where hepatitis B occurs often. Talk with your health care provider about which countries are considered high risk.  Your parents were born in a high-risk country and you have not received a shot to protect against hepatitis B (hepatitis B vaccine).  You have HIV or AIDS.  You use needles to inject street drugs.  You live with, or have sex with, someone who has hepatitis B.  You are a man who has  sex with other men (MSM).  You get hemodialysis treatment.  You take certain medicines for conditions like cancer, organ transplantation, and autoimmune conditions.  Hepatitis C blood testing is recommended for all people born from [birth year] through 1963 and any individual with known risk factors for hepatitis C.  Healthy men should no longer receive prostate-specific antigen (PSA) blood tests as part of routine cancer screening. Talk to your health care provider about prostate cancer screening.  Testicular cancer screening is not recommended for adolescents or adult males who have no symptoms. Screening includes self-exam, a health care provider exam, and other screening tests. Consult with your health care provider about any symptoms you have or any concerns you have about testicular cancer.  Practice safe sex. Use condoms and avoid high-risk sexual practices to reduce the spread of sexually transmitted infections (STIs).  You should be screened for STIs, including gonorrhea and chlamydia if:  You are sexually active and are younger than 24 years.  You are older than 24 years,  and your health care provider tells you that you are at risk for this type of infection.  Your sexual activity has changed since you were last screened, and you are at an increased risk for chlamydia or gonorrhea. Ask your health care provider if you are at risk.  If you are at risk of being infected with HIV, it is recommended that you take a prescription medicine daily to prevent HIV infection. This is called pre-exposure prophylaxis (PrEP). You are considered at risk if:  You are a man who has sex with other men (MSM).  You are a heterosexual man who is sexually active with multiple partners.  You take drugs by injection.  You are sexually active with a partner who has HIV.  Talk with your health care provider about whether you are at high risk of being infected with HIV. If you choose to begin PrEP, you should  first be tested for HIV. You should then be tested every 3 months for as long as you are taking PrEP.  Use sunscreen. Apply sunscreen liberally and repeatedly throughout the day. You should seek shade when your shadow is shorter than you. Protect yourself by wearing long sleeves, pants, a wide-brimmed hat, and sunglasses year round whenever you are outdoors.  Tell your health care provider of new moles or changes in moles, especially if there is a change in shape or color. Also, tell your health care provider if a mole is larger than the size of a pencil eraser.  A one-time screening for abdominal aortic aneurysm (AAA) and surgical repair of large AAAs by ultrasound is recommended for men aged 28-75 years who are current or former smokers.  Stay current with your vaccines (immunizations).   This information is not intended to replace advice given to you by your health care provider. Make sure you discuss any questions you have with your health care provider.   Document Released: 09/24/2007 Document Revised: 04/18/2014 Document Reviewed: 08/23/2010 Elsevier Interactive Patient Education [employer].

## 2015-11-23 ENCOUNTER — Telehealth: Payer: Self-pay

## 2015-11-23 ENCOUNTER — Ambulatory Visit (INDEPENDENT_AMBULATORY_CARE_PROVIDER_SITE_OTHER)
Admission: RE | Admit: 2015-11-23 | Discharge: 2015-11-23 | Disposition: A | Discharge status: Home / Self Care | Payer: Medicare Other | Source: Ambulatory Visit | Attending: Internal Medicine | Admitting: Internal Medicine

## 2015-11-23 ENCOUNTER — Encounter: Payer: Self-pay | Admitting: Internal Medicine

## 2015-11-23 ENCOUNTER — Ambulatory Visit (INDEPENDENT_AMBULATORY_CARE_PROVIDER_SITE_OTHER): Payer: Medicare Other | Admitting: Internal Medicine

## 2015-11-23 VITALS — BP 128/68 | HR 59 | Temp 98.3°F | Ht 66.0 in | Wt 143.5 lb

## 2015-11-23 DIAGNOSIS — M19071 Primary osteoarthritis, right ankle and foot: Secondary | ICD-10-CM

## 2015-11-23 DIAGNOSIS — M19072 Primary osteoarthritis, left ankle and foot: Secondary | ICD-10-CM

## 2015-11-23 DIAGNOSIS — M47817 Spondylosis without myelopathy or radiculopathy, lumbosacral region: Secondary | ICD-10-CM | POA: Insufficient documentation

## 2015-11-23 DIAGNOSIS — M5137 Other intervertebral disc degeneration, lumbosacral region: Secondary | ICD-10-CM

## 2015-11-23 DIAGNOSIS — I10 Essential (primary) hypertension: Secondary | ICD-10-CM

## 2015-11-23 MED ORDER — DICLOFENAC SODIUM 1 % TD GEL: 2.0000 g | Freq: Four times a day (QID) | TRANSDERMAL | 11 refills | Status: DC

## 2015-11-23 NOTE — Telephone Encounter (Signed)
PA initiated via CoverMyMeds key [id number]

## 2015-11-23 NOTE — Patient Instructions (Signed)

## 2015-11-23 NOTE — Progress Notes (Signed)
Pre visit review using our clinic review tool, if applicable. No additional management support is needed unless otherwise documented below in the visit note. 

## 2015-11-23 NOTE — Progress Notes (Signed)
Subjective:  Patient ID: Christopher Aid., male    DOB: 1933-06-30  Age: 82 y.o. MRN: [id number]  CC: Foot Pain   HPI Christopher Aid. presents for a several week history of nontraumatic left foot pain located at the base of the toes as well as around the left ankle. He has a history of gout but has not recently noticed any redness or swelling. He has been taking Motrin and colchicine which is provided symptom relief. He has a history of renal insufficiency.  Outpatient Medications Prior to Visit  Medication Sig Dispense Refill  . Cholecalciferol (VITAMIN D) 2000 UNITS CAPS Take 2,000 Units by mouth daily.    . cilostazol (PLETAL) 100 MG tablet Take 1 tablet (100 mg total) by mouth 2 (two) times daily before a meal. 60 tablet 11  . colchicine 0.6 MG tablet TAKE 1 TABLET BY MOUTH DAILY AS NEEDED FOR GOUT 30 tablet 5  . ezetimibe (ZETIA) 10 MG tablet Take 1 tablet (10 mg total) by mouth daily. 90 tablet 3  . febuxostat (ULORIC) 40 MG tablet Take 2 tablets (80 mg total) by mouth daily. 90 tablet 1  . fish oil-omega-3 fatty acids 1000 MG capsule Take 1 g by mouth daily.     . hydrochlorothiazide (MICROZIDE) 12.5 MG capsule TAKE 1 CAPSULE(12.5 MG) BY MOUTH DAILY 90 capsule 1  . hydroxypropyl methylcellulose (ISOPTO TEARS) 2.5 % ophthalmic solution Place 1 drop into both eyes daily as needed for dry eyes. Reported on 06/23/2015    . lisinopril (PRINIVIL,ZESTRIL) 20 MG tablet TAKE 1 TABLET DAILY. 30 tablet 5  . tadalafil (CIALIS) 5 MG tablet Take 1 tablet (5 mg total) by mouth daily as needed for erectile dysfunction. 30 tablet 11  . ibuprofen (ADVIL,MOTRIN) 200 MG tablet Take 200 mg by mouth every 6 (six) hours as needed.     No facility-administered medications prior to visit.     ROS Review of Systems  Constitutional: Negative.  Negative for chills, fatigue and fever.  HENT: Negative.   Eyes: Negative.  Negative for visual disturbance.  Respiratory: Negative for cough, chest  tightness, shortness of breath and stridor.   Cardiovascular: Negative.  Negative for chest pain, palpitations and leg swelling.  Gastrointestinal: Negative.  Negative for abdominal pain, diarrhea, nausea and vomiting.  Endocrine: Negative.   Genitourinary: Negative.   Musculoskeletal: Positive for arthralgias. Negative for back pain and joint swelling.  Skin: Negative.  Negative for color change, pallor and rash.  Allergic/Immunologic: Negative.   Neurological: Negative.   Hematological: Negative.  Negative for adenopathy. Does not bruise/bleed easily.    Objective:  BP 128/68 (BP Location: Left Arm, Patient Position: Sitting, Cuff Size: Normal)   Pulse (!) 59   Temp 98.3 F (36.8 C) (Oral)   Ht 5\' 6"  (1.676 m)   Wt 143 lb 8 oz (65.1 kg)   SpO2 94%   BMI 23.16 kg/m   BP Readings from Last 3 Encounters:  11/23/15 128/68  11/18/15 132/64  11/18/15 136/70    Wt Readings from Last 3 Encounters:  11/23/15 143 lb 8 oz (65.1 kg)  11/18/15 142 lb 8 oz (64.6 kg)  11/18/15 142 lb 12 oz (64.8 kg)    Physical Exam  Constitutional: He is oriented to person, place, and time. No distress.  HENT:  Mouth/Throat: Oropharynx is clear and moist. No oropharyngeal exudate.  Eyes: Conjunctivae are normal. Right eye exhibits no discharge. Left eye exhibits no discharge. No scleral icterus.  Neck:  Normal range of motion. Neck supple. No JVD present. No tracheal deviation present. No thyromegaly present.  Cardiovascular: Normal rate, regular rhythm, S1 normal, S2 normal and intact distal pulses.  Exam reveals no gallop and no friction rub.   Murmur heard.  Systolic murmur is present with a grade of 1/6   No diastolic murmur is present  Pulmonary/Chest: Effort normal and breath sounds normal. No stridor. No respiratory distress. He has no wheezes. He has no rales. He exhibits no tenderness.  Abdominal: Soft. Bowel sounds are normal. He exhibits no distension and no mass. There is no tenderness.  There is no rebound and no guarding.  Musculoskeletal: He exhibits no edema or deformity.       Left ankle: He exhibits normal range of motion, no swelling, no ecchymosis and no deformity. Tenderness. Lateral malleolus and medial malleolus tenderness found. Achilles tendon normal.       Left foot: There is tenderness and bony tenderness. There is normal range of motion, no swelling, normal capillary refill, no crepitus, no deformity and no laceration.  Left foot shows good sensation capillary refill. There is no erythema or swelling. The left ankle is diffusely tender and shows degenerative changes. The left foot only shows tenderness to palpation at the base of the third, fourth MTP joints where there are degenerative changes.  Lymphadenopathy:    He has no cervical adenopathy.  Neurological: He is oriented to person, place, and time.  Skin: Skin is warm and dry. No rash noted. He is not diaphoretic. No erythema. No pallor.  Vitals reviewed.   Lab Results  Component Value Date   WBC 8.6 09/10/2015   HGB 13.5 09/10/2015   HCT 39.9 09/10/2015   PLT 252.0 09/10/2015   GLUCOSE 108 (H) 09/10/2015   CHOL 187 12/11/2014   TRIG 170.0 (H) 12/11/2014   HDL 32.80 (L) 12/11/2014   LDLDIRECT 131.4 12/09/2013   LDLCALC 120 (H) 12/11/2014   ALT 14 12/11/2014   AST 14 12/11/2014   NA 137 09/10/2015   K 4.9 09/10/2015   CL 105 09/10/2015   CREATININE 1.79 (H) 09/10/2015   BUN 43 (H) 09/10/2015   CO2 25 09/10/2015   TSH 1.72 12/11/2014   PSA 2.40 12/11/2014    No results found.  Assessment & Plan:   Greyer was seen today for foot pain.  Diagnoses and all orders for this visit:  Osteoarthritis of both feet- on exam there is no evidence of an inflammatory process so I don't think this is gout, his plain film confirms that there are some degenerative changes, he has a history of renal insufficiency so will treat with a topical anti-inflammatory. -     diclofenac sodium (VOLTAREN) 1 % GEL;  Apply 2 g topically 4 (four) times daily. -     DG Foot Complete Left; Future  DJD (degenerative joint disease), lumbosacral  Essential hypertension- his blood pressure is well-controlled, he has a history of renal insufficiency so I have asked him to stop taking systemic anti-inflammatories such as ibuprofen.   I have discontinued Mr. Bultema's ibuprofen. I am also having him start on diclofenac sodium. Additionally, I am having him maintain his fish oil-omega-3 fatty acids, Vitamin D, hydroxypropyl methylcellulose / hypromellose, tadalafil, ezetimibe, lisinopril, colchicine, hydrochlorothiazide, cilostazol, and febuxostat.  Meds ordered this encounter  Medications  . diclofenac sodium (VOLTAREN) 1 % GEL    Sig: Apply 2 g topically 4 (four) times daily.    Dispense:  100 g  Refill:  11     Follow-up: Return in about 6 months (around 05/25/2016).  Scarlette Calico, MD

## 2015-11-24 NOTE — Telephone Encounter (Signed)
APPROVED through 04/10/2016 

## 2015-11-27 ENCOUNTER — Other Ambulatory Visit: Payer: Self-pay | Admitting: Internal Medicine

## 2015-12-20 ENCOUNTER — Other Ambulatory Visit: Payer: Self-pay | Admitting: Internal Medicine

## 2015-12-20 DIAGNOSIS — I10 Essential (primary) hypertension: Secondary | ICD-10-CM

## 2015-12-22 ENCOUNTER — Other Ambulatory Visit: Payer: Self-pay | Admitting: Internal Medicine

## 2015-12-22 DIAGNOSIS — E785 Hyperlipidemia, unspecified: Secondary | ICD-10-CM

## 2015-12-22 DIAGNOSIS — I739 Peripheral vascular disease, unspecified: Secondary | ICD-10-CM

## 2015-12-31 ENCOUNTER — Other Ambulatory Visit: Payer: Self-pay | Admitting: Internal Medicine

## 2015-12-31 DIAGNOSIS — R351 Nocturia: Principal | ICD-10-CM

## 2015-12-31 DIAGNOSIS — N401 Enlarged prostate with lower urinary tract symptoms: Secondary | ICD-10-CM

## 2016-01-01 NOTE — Telephone Encounter (Signed)
Please advise in PCP absence.  

## 2016-01-11 ENCOUNTER — Telehealth: Payer: Self-pay

## 2016-01-11 NOTE — Telephone Encounter (Signed)
PA request received via walk in form from pt with attached claim rejection from pharmacy that states "Plan exclusion - drug not covered"  PA initiated although medication is excluded via CoverMyMeds key [id number]

## 2016-01-13 NOTE — Telephone Encounter (Signed)
PA APPROVED for through 04/10/2017. Pt advised

## 2016-02-15 ENCOUNTER — Other Ambulatory Visit: Payer: Self-pay | Admitting: *Deleted

## 2016-02-15 MED ORDER — TADALAFIL 5 MG PO TABS: ORAL_TABLET | ORAL | 0 refills | Status: DC

## 2016-06-02 ENCOUNTER — Telehealth: Payer: Self-pay | Admitting: Emergency Medicine

## 2016-06-02 NOTE — Telephone Encounter (Signed)
Error

## 2016-06-06 ENCOUNTER — Ambulatory Visit (INDEPENDENT_AMBULATORY_CARE_PROVIDER_SITE_OTHER): Payer: Medicare Other | Admitting: Internal Medicine

## 2016-06-06 ENCOUNTER — Encounter: Payer: Self-pay | Admitting: Internal Medicine

## 2016-06-06 ENCOUNTER — Other Ambulatory Visit (INDEPENDENT_AMBULATORY_CARE_PROVIDER_SITE_OTHER): Payer: Medicare Other

## 2016-06-06 VITALS — BP 130/80 | HR 55 | Temp 97.8°F | Resp 16 | Ht 66.0 in | Wt 150.5 lb

## 2016-06-06 DIAGNOSIS — I10 Essential (primary) hypertension: Secondary | ICD-10-CM

## 2016-06-06 DIAGNOSIS — N183 Chronic kidney disease, stage 3 unspecified: Secondary | ICD-10-CM

## 2016-06-06 DIAGNOSIS — E785 Hyperlipidemia, unspecified: Secondary | ICD-10-CM

## 2016-06-06 DIAGNOSIS — N400 Enlarged prostate without lower urinary tract symptoms: Secondary | ICD-10-CM

## 2016-06-06 DIAGNOSIS — I739 Peripheral vascular disease, unspecified: Secondary | ICD-10-CM

## 2016-06-06 LAB — COMPREHENSIVE METABOLIC PANEL
ALBUMIN: 4.2 g/dL (ref 3.5–5.2)
ALK PHOS: 79 U/L (ref 39–117)
ALT: 25 U/L (ref 0–53)
AST: 16 U/L (ref 0–37)
BILIRUBIN TOTAL: 0.5 mg/dL (ref 0.2–1.2)
BUN: 30 mg/dL — AB (ref 6–23)
CALCIUM: 9.1 mg/dL (ref 8.4–10.5)
CHLORIDE: 108 meq/L (ref 96–112)
CO2: 25 mEq/L (ref 19–32)
CREATININE: 1.49 mg/dL (ref 0.40–1.50)
GFR: 48.19 mL/min — ABNORMAL LOW (ref >60.00)
Glucose, Bld: 104 mg/dL — ABNORMAL HIGH (ref 70–99)
Potassium: 4.4 mEq/L (ref 3.5–5.1)
SODIUM: 140 meq/L (ref 135–145)
Total Protein: 7.1 g/dL (ref 6.0–8.3)

## 2016-06-06 LAB — CBC WITH DIFFERENTIAL/PLATELET
BASOS ABS: 0.1 10*3/uL (ref 0.0–0.1)
BASOS PCT: 1.5 % (ref 0.0–3.0)
EOS ABS: 0.3 10*3/uL (ref 0.0–0.7)
Eosinophils Relative: 3.4 % (ref 0.0–5.0)
HEMATOCRIT: 38.2 % — AB (ref 39.0–52.0)
HEMOGLOBIN: 12.9 g/dL — AB (ref 13.0–17.0)
LYMPHS PCT: 28.2 % (ref 12.0–46.0)
Lymphs Abs: 2.4 10*3/uL (ref 0.7–4.0)
MCHC: 33.9 g/dL (ref 30.0–36.0)
MCV: 89.2 fl (ref 78.0–100.0)
MONO ABS: 0.9 10*3/uL (ref 0.1–1.0)
Monocytes Relative: 10.8 % (ref 3.0–12.0)
Neutro Abs: 4.8 10*3/uL (ref 1.4–7.7)
Neutrophils Relative %: 56.1 % (ref 43.0–77.0)
Platelets: 220 10*3/uL (ref 150.0–400.0)
RBC: 4.28 Mil/uL (ref 4.22–5.81)
RDW: 14.3 % (ref 11.5–15.5)
WBC: 8.6 10*3/uL (ref 4.0–10.5)

## 2016-06-06 LAB — LIPID PANEL
CHOLESTEROL: 188 mg/dL (ref 0–200)
HDL: 34.9 mg/dL — ABNORMAL LOW (ref >39.00)
LDL CALC: 122 mg/dL — AB (ref 0–99)
NonHDL: 153.53
Total CHOL/HDL Ratio: 5
Triglycerides: 156 mg/dL — ABNORMAL HIGH (ref 0.0–149.0)
VLDL: 31.2 mg/dL (ref 0.0–40.0)

## 2016-06-06 LAB — TSH: TSH: 2.62 u[IU]/mL (ref 0.35–4.50)

## 2016-06-06 MED ORDER — TADALAFIL 5 MG PO TABS: ORAL_TABLET | ORAL | 3 refills | Status: DC

## 2016-06-06 NOTE — Patient Instructions (Signed)
Peripheral Vascular Disease Peripheral vascular disease (PVD) is a disease of the blood vessels that are not part of your heart and brain. A simple term for PVD is poor circulation. In most cases, PVD narrows the blood vessels that carry blood from your heart to the rest of your body. This can result in a decreased supply of blood to your arms, legs, and internal organs, like your stomach or kidneys. However, it most often affects a person's lower legs and feet. There are two types of PVD.  Organic PVD. This is the more common type. It is caused by damage to the structure of blood vessels.  Functional PVD. This is caused by conditions that make blood vessels contract and tighten (spasm).  Without treatment, PVD tends to get worse over time. PVD can also lead to acute ischemic limb. This is when an arm or limb suddenly has trouble getting enough blood. This is a medical emergency. What are the causes? Each type of PVD has many different causes. The most common cause of PVD is buildup of a fatty material (plaque) inside of your arteries (atherosclerosis). Small amounts of plaque can break off from the walls of the blood vessels and become lodged in a smaller artery. This blocks blood flow and can cause acute ischemic limb. Other common causes of PVD include:  Blood clots that form inside of blood vessels.  Injuries to blood vessels.  Diseases that cause inflammation of blood vessels or cause blood vessel spasms.  Health behaviors and health history that increase your risk of developing PVD.  What increases the risk? You may have a greater risk of PVD if you:  Have a family history of PVD.  Have certain medical conditions, including: ? High cholesterol. ? Diabetes. ? High blood pressure (hypertension). ? Coronary heart disease. ? Past problems with blood clots. ? Past injury, such as burns or a broken bone. These may have damaged blood vessels in your limbs. ? Buerger disease. This is  caused by inflamed blood vessels in your hands and feet. ? Some forms of arthritis. ? Rare birth defects that affect the arteries in your legs.  Use tobacco.  Do not get enough exercise.  Are obese.  Are age 50 or older.  What are the signs or symptoms? PVD may cause many different symptoms. Your symptoms depend on what part of your body is not getting enough blood. Some common signs and symptoms include:  Cramps in your lower legs. This may be a symptom of poor leg circulation (claudication).  Pain and weakness in your legs while you are physically active that goes away when you rest (intermittent claudication).  Leg pain when at rest.  Leg numbness, tingling, or weakness.  Coldness in a leg or foot, especially when compared with the other leg.  Skin or hair changes. These can include: ? Hair loss. ? Shiny skin. ? Pale or bluish skin. ? Thick toenails.  Inability to get or maintain an erection (erectile dysfunction).  People with PVD are more prone to developing ulcers and sores on their toes, feet, or legs. These may take longer than normal to heal. How is this diagnosed? Your health care provider may diagnose PVD from your signs and symptoms. The health care provider will also do a physical exam. You may have tests to find out what is causing your PVD and determine its severity. Tests may include:  Blood pressure recordings from your arms and legs and measurements of the strength of your pulses (  pulse volume recordings).  Imaging studies using sound waves to take pictures of the blood flow through your blood vessels (Doppler ultrasound).  Injecting a dye into your blood vessels before having imaging studies using: ? X-rays (angiogram or arteriogram). ? Computer-generated X-rays (CT angiogram). ? A powerful electromagnetic field and a computer (magnetic resonance angiogram or MRA).  How is this treated? Treatment for PVD depends on the cause of your condition and the  severity of your symptoms. It also depends on your age. Underlying causes need to be treated and controlled. These include long-lasting (chronic) conditions, such as diabetes, high cholesterol, and high blood pressure. You may need to first try making lifestyle changes and taking medicines. Surgery may be needed if these do not work. Lifestyle changes may include:  Quitting smoking.  Exercising regularly.  Following a low-fat, low-cholesterol diet.  Medicines may include:  Blood thinners to prevent blood clots.  Medicines to improve blood flow.  Medicines to improve your blood cholesterol levels.  Surgical procedures may include:  A procedure that uses an inflated balloon to open a blocked artery and improve blood flow (angioplasty).  A procedure to put in a tube (stent) to keep a blocked artery open (stent implant).  Surgery to reroute blood flow around a blocked artery (peripheral bypass surgery).  Surgery to remove dead tissue from an infected wound on the affected limb.  Amputation. This is surgical removal of the affected limb. This may be necessary in cases of acute ischemic limb that are not improved through medical or surgical treatments.  Follow these instructions at home:  Take medicines only as directed by your health care provider.  Do not use any tobacco products, including cigarettes, chewing tobacco, or electronic cigarettes. If you need help quitting, ask your health care provider.  Lose weight if you are overweight, and maintain a healthy weight as directed by your health care provider.  Eat a diet that is low in fat and cholesterol. If you need help, ask your health care provider.  Exercise regularly. Ask your health care provider to suggest some good activities for you.  Use compression stockings or other mechanical devices as directed by your health care provider.  Take good care of your feet. ? Wear comfortable shoes that fit well. ? Check your feet  often for any cuts or sores. Contact a health care provider if:  You have cramps in your legs while walking.  You have leg pain when you are at rest.  You have coldness in a leg or foot.  Your skin changes.  You have erectile dysfunction.  You have cuts or sores on your feet that are not healing. Get help right away if:  Your arm or leg turns cold and blue.  Your arms or legs become red, warm, swollen, painful, or numb.  You have chest pain or trouble breathing.  You suddenly have weakness in your face, arm, or leg.  You become very confused or lose the ability to speak.  You suddenly have a very bad headache or lose your vision. This information is not intended to replace advice given to you by your health care provider. Make sure you discuss any questions you have with your health care provider. Document Released: 05/05/2004 Document Revised: 09/03/2015 Document Reviewed: 09/05/2013 Elsevier Interactive Patient Education  2017 [employer]  

## 2016-06-06 NOTE — Progress Notes (Signed)
Subjective:  Patient ID: Christopher Rubio., male    DOB: Sep 28, 1933  Age: 83 y.o. MRN: [id number]  CC: Hypertension and Hyperlipidemia   HPI Drex Piersol. presents for f/up - his BP has been well controlled and he denies CP,DOE, HA,BV, edema, fatigue. He continues to smoke. He has PAD but denies any recent episodes of claudication or rest pain.  Outpatient Medications Prior to Visit  Medication Sig Dispense Refill  . Cholecalciferol (VITAMIN D) 2000 UNITS CAPS Take 2,000 Units by mouth daily.    . cilostazol (PLETAL) 100 MG tablet Take 1 tablet (100 mg total) by mouth 2 (two) times daily before a meal. 60 tablet 11  . colchicine 0.6 MG tablet TAKE 1 TABLET BY MOUTH DAILY AS NEEDED FOR GOUT 30 tablet 5  . ezetimibe (ZETIA) 10 MG tablet TAKE 1 TABLET BY MOUTH ONCE DAILY 90 tablet 3  . febuxostat (ULORIC) 40 MG tablet Take 2 tablets (80 mg total) by mouth daily. 90 tablet 1  . fish oil-omega-3 fatty acids 1000 MG capsule Take 1 g by mouth daily.     . hydrochlorothiazide (MICROZIDE) 12.5 MG capsule TAKE 1 CAPSULE(12.5 MG) BY MOUTH DAILY 90 capsule 2  . hydroxypropyl methylcellulose (ISOPTO TEARS) 2.5 % ophthalmic solution Place 1 drop into both eyes daily as needed for dry eyes. Reported on 06/23/2015    . lisinopril (PRINIVIL,ZESTRIL) 20 MG tablet TAKE 1 TABLET BY MOUTH ONCE DAILY 90 tablet 3  . diclofenac sodium (VOLTAREN) 1 % GEL Apply 2 g topically 4 (four) times daily. 100 g 11  . lisinopril (PRINIVIL,ZESTRIL) 20 MG tablet TAKE 1 TABLET DAILY. 30 tablet 5  . tadalafil (CIALIS) 5 MG tablet TAKE 1 TABLET(5 MG) BY MOUTH DAILY AS NEEDED FOR ERECTILE DYSFUNCTION 30 tablet 0   No facility-administered medications prior to visit.     ROS Review of Systems  Constitutional: Negative for appetite change, diaphoresis, fatigue and unexpected weight change.  HENT: Negative.  Negative for facial swelling, sinus pressure and trouble swallowing.   Eyes: Negative.   Respiratory:  Negative.  Negative for cough, chest tightness, shortness of breath and wheezing.   Cardiovascular: Negative for chest pain, palpitations and leg swelling.  Gastrointestinal: Negative for abdominal pain, constipation, diarrhea, nausea and vomiting.  Endocrine: Negative.   Genitourinary: Negative.  Negative for difficulty urinating.  Musculoskeletal: Negative.  Negative for arthralgias, back pain, myalgias and neck pain.  Skin: Negative.   Allergic/Immunologic: Negative.   Neurological: Negative.  Negative for dizziness and light-headedness.  Hematological: Negative for adenopathy. Does not bruise/bleed easily.  Psychiatric/Behavioral: Negative.     Objective:  BP 130/80 (BP Location: Left Arm, Patient Position: Sitting, Cuff Size: Normal)   Pulse (!) 55   Temp 97.8 F (36.6 C) (Oral)   Resp 16   Ht 5\' 6"  (1.676 m)   Wt 150 lb 8 oz (68.3 kg)   SpO2 93%   BMI 24.29 kg/m   BP Readings from Last 3 Encounters:  06/06/16 130/80  11/23/15 128/68  11/18/15 132/64    Wt Readings from Last 3 Encounters:  06/06/16 150 lb 8 oz (68.3 kg)  11/23/15 143 lb 8 oz (65.1 kg)  11/18/15 142 lb 8 oz (64.6 kg)    Physical Exam  Constitutional: He is oriented to person, place, and time. No distress.  HENT:  Mouth/Throat: Oropharynx is clear and moist. No oropharyngeal exudate.  Eyes: Conjunctivae are normal. Right eye exhibits no discharge. Left eye exhibits no discharge. No  scleral icterus.  Neck: Normal range of motion. Neck supple. No JVD present. No tracheal deviation present. No thyromegaly present.  Cardiovascular: Normal rate, regular rhythm, normal heart sounds and intact distal pulses.  Exam reveals no gallop and no friction rub.   No murmur heard. Pulmonary/Chest: Effort normal. No accessory muscle usage or stridor. No tachypnea. No respiratory distress. He has no decreased breath sounds. He has wheezes in the right middle field and the left middle field. He has no rhonchi. He has no  rales. He exhibits no tenderness.  Abdominal: Soft. Bowel sounds are normal. He exhibits no distension and no mass. There is no tenderness. There is no rebound and no guarding.  Musculoskeletal: Normal range of motion. He exhibits no edema, tenderness or deformity.  Lymphadenopathy:    He has no cervical adenopathy.  Neurological: He is oriented to person, place, and time.  Skin: Skin is warm and dry. No rash noted. He is not diaphoretic. No erythema. No pallor.  Vitals reviewed.   Lab Results  Component Value Date   WBC 8.6 06/06/2016   HGB 12.9 (L) 06/06/2016   HCT 38.2 (L) 06/06/2016   PLT 220.0 06/06/2016   GLUCOSE 104 (H) 06/06/2016   CHOL 188 06/06/2016   TRIG 156.0 (H) 06/06/2016   HDL 34.90 (L) 06/06/2016   LDLDIRECT 131.4 12/09/2013   LDLCALC 122 (H) 06/06/2016   ALT 25 06/06/2016   AST 16 06/06/2016   NA 140 06/06/2016   K 4.4 06/06/2016   CL 108 06/06/2016   CREATININE 1.49 06/06/2016   BUN 30 (H) 06/06/2016   CO2 25 06/06/2016   TSH 2.62 06/06/2016   PSA 2.40 12/11/2014    Dg Foot Complete Left  Result Date: 11/23/2015 CLINICAL DATA:  Pain.  No known injury.  Initial evaluation. EXAM: LEFT FOOT - COMPLETE 3+ VIEW COMPARISON:  No recent prior . FINDINGS: Diffuse osteopenia and degenerative change. No evidence of fracture or dislocation. Vascular calcification. IMPRESSION: 1.  No acute abnormality.  Diffuse osteopenia degenerative change. 2. Peripheral vascular disease. Electronically Signed   By: Marcello Moores  Register   On: 11/23/2015 09:28    Assessment & Plan:   Wahab was seen today for hypertension and hyperlipidemia.  Diagnoses and all orders for this visit:  Essential hypertension- his BP is well controlled, lytes are normal, renal fxn is stable -     CBC with Differential/Platelet; Future  PAD (peripheral artery disease) ([place])- he has no sx's and dose not want to pursue any further treatment options at this time, he has had a prior small ICH and will not  take as asa either -     Lipid panel; Future  Hyperlipidemia with target LDL less than 100- he has not achieved his LDL goal and is not willing to take a statin -     Lipid panel; Future -     TSH; Future  BPH without urinary obstruction- sx's have improved with cialis, will cont -     tadalafil (CIALIS) 5 MG tablet; TAKE 1 TABLET(5 MG) BY MOUTH DAILY AS NEEDED FOR ERECTILE DYSFUNCTION  Kidney disease, chronic, stage III (GFR 30-59 ml/min)- his renal fxn has improved, will maintain good BP control, he will avoid nephrotoxic agents -     Comprehensive metabolic panel; Future   I have discontinued Mr. Riquelme's diclofenac sodium. I am also having him maintain his fish oil-omega-3 fatty acids, Vitamin D, hydroxypropyl methylcellulose / hypromellose, colchicine, cilostazol, febuxostat, hydrochlorothiazide, lisinopril, ezetimibe, and tadalafil.  Meds ordered  this encounter  Medications  . tadalafil (CIALIS) 5 MG tablet    Sig: TAKE 1 TABLET(5 MG) BY MOUTH DAILY AS NEEDED FOR ERECTILE DYSFUNCTION    Dispense:  90 tablet    Refill:  3     Follow-up: Return in about 6 months (around 12/04/2016).  Scarlette Calico, MD

## 2016-06-06 NOTE — Progress Notes (Signed)
Pre visit review using our clinic review tool, if applicable. No additional management support is needed unless otherwise documented below in the visit note. 

## 2016-06-09 ENCOUNTER — Telehealth: Payer: Self-pay | Admitting: Internal Medicine

## 2016-06-09 NOTE — Telephone Encounter (Signed)
Pt came in and wanted to know if he needs to still be on low dose Asprin once a day?    Best number -   [phone]

## 2016-06-12 NOTE — Telephone Encounter (Signed)
Did he previously have a stroke with brain bleeding?

## 2016-06-13 NOTE — Telephone Encounter (Signed)
Left message asking patient to call back to discuss dr [name] note so that we can determine if he still needs to be on this med----can talk with Christopher Rubio if any questions

## 2016-06-14 ENCOUNTER — Other Ambulatory Visit: Payer: Self-pay | Admitting: Internal Medicine

## 2016-06-14 DIAGNOSIS — I739 Peripheral vascular disease, unspecified: Secondary | ICD-10-CM

## 2016-06-14 MED ORDER — ASPIRIN EC 81 MG PO TBEC: 81.0000 mg | DELAYED_RELEASE_TABLET | Freq: Every day | ORAL | 3 refills | Status: DC

## 2016-06-14 NOTE — Telephone Encounter (Signed)
Yes, start a baby aspirin qd

## 2016-06-14 NOTE — Telephone Encounter (Signed)
See below

## 2016-06-14 NOTE — Telephone Encounter (Signed)
Patient called back and stated he Did NOT have previous stroke with brain bleeding----routing to dr [name], please advise, I will call patient back, thanks

## 2016-06-15 NOTE — Telephone Encounter (Signed)
Patient advised to start baby aspirin

## 2016-07-14 ENCOUNTER — Other Ambulatory Visit: Payer: Self-pay | Admitting: Vascular Surgery

## 2016-07-14 DIAGNOSIS — I739 Peripheral vascular disease, unspecified: Secondary | ICD-10-CM

## 2016-07-28 ENCOUNTER — Other Ambulatory Visit: Payer: Self-pay | Admitting: *Deleted

## 2016-07-28 DIAGNOSIS — I739 Peripheral vascular disease, unspecified: Secondary | ICD-10-CM

## 2016-07-28 MED ORDER — CILOSTAZOL 100 MG PO TABS: 100.0000 mg | ORAL_TABLET | Freq: Two times a day (BID) | ORAL | 11 refills | Status: DC

## 2016-08-01 DIAGNOSIS — Z0289 Encounter for other administrative examinations: Secondary | ICD-10-CM

## 2016-09-01 ENCOUNTER — Other Ambulatory Visit: Payer: Self-pay | Admitting: Internal Medicine

## 2017-01-02 ENCOUNTER — Other Ambulatory Visit: Payer: Self-pay | Admitting: Internal Medicine

## 2017-01-02 DIAGNOSIS — E785 Hyperlipidemia, unspecified: Secondary | ICD-10-CM

## 2017-01-02 DIAGNOSIS — I739 Peripheral vascular disease, unspecified: Secondary | ICD-10-CM

## 2017-01-19 ENCOUNTER — Ambulatory Visit (INDEPENDENT_AMBULATORY_CARE_PROVIDER_SITE_OTHER): Payer: Medicare Other | Admitting: *Deleted

## 2017-01-19 VITALS — BP 146/74 | HR 54 | Resp 20 | Ht 66.0 in | Wt 145.0 lb

## 2017-01-19 DIAGNOSIS — Z0000 Encounter for general adult medical examination without abnormal findings: Secondary | ICD-10-CM

## 2017-01-19 DIAGNOSIS — Z23 Encounter for immunization: Secondary | ICD-10-CM | POA: Diagnosis not present

## 2017-01-19 NOTE — Patient Instructions (Addendum)
Continue doing brain stimulating activities (puzzles, reading, adult coloring books, staying active) to keep memory sharp.   Continue to eat heart healthy diet (full of fruits, vegetables, whole grains, lean protein, water--limit salt, fat, and sugar intake) and increase physical activity as tolerated.   Mr. Christopher Rubio , Thank you for taking time to come for your Medicare Wellness Visit. I appreciate your ongoing commitment to your health goals. Please review the following plan we discussed and let me know if I can assist you in the future.   These are the goals we discussed: Goals    . Exercise 150 minutes per week (moderate activity)          Will start weight program at home Loss of muscle tone; recommended exercise with the 5 lb weights he has or can get exercise rubber bands or stretch bands      . patient          Will add some calories to diet in the am Peanut butter; banana; cereal; blueberries  Gain weight Add honey; Eat nuts; (mixed) almonds or walnuts  Ice cream;  Protein bar       . Stay as healthy and as independent as possible          Continue to exercise, eat healthy, and take care of me the best I can.     . will continue yoga          Will continue to embrace yoga and experiment with other forms of relaxation       This is a list of the screening recommended for you and due dates:  Health Maintenance  Topic Date Due  . Flu Shot  11/09/2016  . Tetanus Vaccine  08/11/2018  . Pneumonia vaccines  Completed   Influenza Virus Vaccine injection What is this medicine? INFLUENZA VIRUS VACCINE (in floo EN zuh VAHY ruhs vak SEEN) helps to reduce the risk of getting influenza also known as the flu. The vaccine only helps protect you against some strains of the flu. This medicine may be used for other purposes; ask your health care provider or pharmacist if you have questions. COMMON BRAND NAME(S): Afluria, Agriflu, Alfuria, FLUAD, Fluarix, Fluarix Quadrivalent,  Flublok, Flublok Quadrivalent, FLUCELVAX, Flulaval, Fluvirin, Fluzone, Fluzone High-Dose, Fluzone Intradermal What should I tell my health care provider before I take this medicine? They need to know if you have any of these conditions: -bleeding disorder like hemophilia -fever or infection -Guillain-Barre syndrome or other neurological problems -immune system problems -infection with the human immunodeficiency virus (HIV) or AIDS -low blood platelet counts -multiple sclerosis -an unusual or allergic reaction to influenza virus vaccine, latex, other medicines, foods, dyes, or preservatives. Different brands of vaccines contain different allergens. Some may contain latex or eggs. Talk to your doctor about your allergies to make sure that you get the right vaccine. -pregnant or trying to get pregnant -breast-feeding How should I use this medicine? This vaccine is for injection into a muscle or under the skin. It is given by a health care professional. A copy of Vaccine Information Statements will be given before each vaccination. Read this sheet carefully each time. The sheet may change frequently. Talk to your healthcare provider to see which vaccines are right for you. Some vaccines should not be used in all age groups. Overdosage: If you think you have taken too much of this medicine contact a poison control center or emergency room at once. NOTE: This medicine is only for you. Do  not share this medicine with others. What if I miss a dose? This does not apply. What may interact with this medicine? -chemotherapy or radiation therapy -medicines that lower your immune system like etanercept, anakinra, infliximab, and adalimumab -medicines that treat or prevent blood clots like warfarin -phenytoin -steroid medicines like prednisone or cortisone -theophylline -vaccines This list may not describe all possible interactions. Give your health care provider a list of all the medicines, herbs,  non-prescription drugs, or dietary supplements you use. Also tell them if you smoke, drink alcohol, or use illegal drugs. Some items may interact with your medicine. What should I watch for while using this medicine? Report any side effects that do not go away within 3 days to your doctor or health care professional. Call your health care provider if any unusual symptoms occur within 6 weeks of receiving this vaccine. You may still catch the flu, but the illness is not usually as bad. You cannot get the flu from the vaccine. The vaccine will not protect against colds or other illnesses that may cause fever. The vaccine is needed every year. What side effects may I notice from receiving this medicine? Side effects that you should report to your doctor or health care professional as soon as possible: -allergic reactions like skin rash, itching or hives, swelling of the face, lips, or tongue Side effects that usually do not require medical attention (report to your doctor or health care professional if they continue or are bothersome): -fever -headache -muscle aches and pains -pain, tenderness, redness, or swelling at the injection site -tiredness This list may not describe all possible side effects. Call your doctor for medical advice about side effects. You may report side effects to FDA at 1-800-FDA-1088. Where should I keep my medicine? The vaccine will be given by a health care professional in a clinic, pharmacy, doctor's office, or other health care setting. You will not be given vaccine doses to store at home. NOTE: This sheet is a summary. It may not cover all possible information. If you have questions about this medicine, talk to your doctor, pharmacist, or health care provider.  2018 Elsevier/Gold Standard (2014-10-17 10:07:28)

## 2017-01-19 NOTE — Progress Notes (Addendum)
Subjective:   Christopher Rubio. is a 83 y.o. male who presents for Medicare Annual/Subsequent preventive examination.  Review of Systems:  No ROS.  Medicare Wellness Visit. Additional risk factors are reflected in the social history.  Cardiac Risk Factors include: advanced age (>7men, >16 women);dyslipidemia;hypertension;smoking/ tobacco exposure;male gender Sleep patterns: gets up 1-2 times nightly to void and sleeps 6-7 hours nightly.    Home Safety/Smoke Alarms: Feels safe in home. Smoke alarms in place.  Living environment; residence and Firearm Safety: 2-story house, no firearms. Lives alone, no needs for DME, limited support system Seat Belt Safety/Bike Helmet: Wears seat belt.      Objective:    Vitals: BP (!) 146/74   Pulse (!) 54   Resp 20   Ht 5\' 6"  (1.676 m)   Wt 145 lb (65.8 kg)   SpO2 98%   BMI 23.40 kg/m   Body mass index is 23.4 kg/m.  Tobacco History  Smoking Status  . Current Every Day Smoker  . Packs/day: 1.00  . Years: 60.00  . Types: Cigarettes  Smokeless Tobacco  . Never Used    Comment: explained LDCT; smokes more since wife died;      Ready to quit: Not Answered Counseling given: Not Answered   Past Medical History:  Diagnosis Date  . Basal cell carcinoma of skin, site unspecified   . Benign neoplasm of colon   . Contact dermatitis and other eczema, due to unspecified cause   . Diverticulosis of colon (without mention of hemorrhage)   . Extrinsic asthma, unspecified   . Gout, unspecified   . Impotence of organic origin   . Other and unspecified hyperlipidemia   . Unspecified essential hypertension    Past Surgical History:  Procedure Laterality Date  . TONSILLECTOMY     Family History  Problem Relation Age of Onset  . Coronary artery disease Father 3       deceased  . Heart attack Father   . Stroke Mother   . Hypertension Mother   . Diabetes Neg Hx   . Cancer Neg Hx        colon, prostate   History  Sexual  Activity  . Sexual activity: Not Currently    Outpatient Encounter Prescriptions as of 01/19/2017  Medication Sig  . Cholecalciferol (VITAMIN D) 2000 UNITS CAPS Take 2,000 Units by mouth daily.  . cilostazol (PLETAL) 100 MG tablet Take 1 tablet (100 mg total) by mouth 2 (two) times daily before a meal.  . colchicine 0.6 MG tablet TAKE 1 TABLET BY MOUTH DAILY AS NEEDED FOR GOUT  . ezetimibe (ZETIA) 10 MG tablet take 1 tablet by mouth once daily  . febuxostat (ULORIC) 40 MG tablet Take 2 tablets (80 mg total) by mouth daily.  . hydrochlorothiazide (MICROZIDE) 12.5 MG capsule take 1 capsule by mouth once daily  . lisinopril (PRINIVIL,ZESTRIL) 20 MG tablet TAKE 1 TABLET BY MOUTH ONCE DAILY  . tadalafil (CIALIS) 5 MG tablet TAKE 1 TABLET(5 MG) BY MOUTH DAILY AS NEEDED FOR ERECTILE DYSFUNCTION  . [DISCONTINUED] aspirin EC 81 MG tablet Take 1 tablet (81 mg total) by mouth daily. (Patient not taking: Reported on 01/19/2017)  . [DISCONTINUED] fish oil-omega-3 fatty acids 1000 MG capsule Take 1 g by mouth daily.   . [DISCONTINUED] hydroxypropyl methylcellulose (ISOPTO TEARS) 2.5 % ophthalmic solution Place 1 drop into both eyes daily as needed for dry eyes. Reported on 06/23/2015   No facility-administered encounter medications on file as of 01/19/2017.  Activities of Daily Living In your present state of health, do you have any difficulty performing the following activities: 01/19/2017  Hearing? N  Vision? N  Difficulty concentrating or making decisions? N  Walking or climbing stairs? N  Dressing or bathing? N  Doing errands, shopping? N  Preparing Food and eating ? N  Using the Toilet? N  In the past six months, have you accidently leaked urine? N  Do you have problems with loss of bowel control? N  Managing your Medications? N  Managing your Finances? N  Housekeeping or managing your Housekeeping? N  Some recent data might be hidden    Patient Care Team: [name], MD as PCP  - General (Internal Medicine)   Assessment:    Physical assessment deferred to PCP.  Exercise Activities and Dietary recommendations Current Exercise Habits: Structured exercise class;Home exercise routine, Type of exercise: walking;yoga (gardening, golf, yard work), Time (Minutes): 55, Frequency (Times/Week): 5, Weekly Exercise (Minutes/Week): 275, Intensity: Mild, Exercise limited by: None identified  Diet (meal preparation, eat out, water intake, caffeinated beverages, dairy products, fruits and vegetables): in general, a "healthy" diet  , well balanced eats a variety of fruits and vegetables daily, drinks 6-8 glasses of water daily.  Reviewed heart healthy, Diet education was provided via handout.   Goals    . Exercise 150 minutes per week (moderate activity)          Will start weight program at home Loss of muscle tone; recommended exercise with the 5 lb weights he has or can get exercise rubber bands or stretch bands      . patient          Will add some calories to diet in the am Peanut butter; banana; cereal; blueberries  Gain weight Add honey; Eat nuts; (mixed) almonds or walnuts  Ice cream;  Protein bar       . Stay as healthy and as independent as possible          Continue to exercise, eat healthy, and take care of me the best I can.     . will continue yoga          Will continue to embrace yoga and experiment with other forms of relaxation      Fall Risk Fall Risk  01/19/2017 11/18/2015 12/12/2014 12/03/2014 12/10/2013  Falls in the past year? No No No No No   Depression Screen PHQ 2/9 Scores 01/19/2017 11/18/2015 12/12/2014 12/03/2014  PHQ - 2 Score 1 0 0 0  PHQ- 9 Score 2 - - -    Cognitive Function MMSE - Mini Mental State Exam 01/19/2017 11/18/2015  Not completed: - (No Data)  Orientation to time 5 -  Orientation to Place 5 -  Registration 3 -  Attention/ Calculation 5 -  Recall 2 -  Language- name 2 objects 2 -  Language- repeat 1 -  Language-  follow 3 step command 3 -  Language- read & follow direction 1 -  Write a sentence 1 -  Copy design 1 -  Total score 29 -        Immunization History  Administered Date(s) Administered  . Influenza, High Dose Seasonal PF 12/11/2014, 01/26/2016  . Influenza,inj,Quad PF,6+ Mos 02/25/2013, 12/30/2013  . Pneumococcal Conjugate-13 03/13/2014  . Pneumococcal Polysaccharide-23 12/26/2012  . Td 08/10/2008  . Zoster 02/13/2010   Screening Tests Health Maintenance  Topic Date Due  . INFLUENZA VACCINE  11/09/2016  . TETANUS/TDAP  08/11/2018  .  PNA vac Low Risk Adult  Completed      Plan:    Continue doing brain stimulating activities (puzzles, reading, adult coloring books, staying active) to keep memory sharp.   Continue to eat heart healthy diet (full of fruits, vegetables, whole grains, lean protein, water--limit salt, fat, and sugar intake) and increase physical activity as tolerated.  I have personally reviewed and noted the following in the patient's chart:   . Medical and social history . Use of alcohol, tobacco or illicit drugs  . Current medications and supplements . Functional ability and status . Nutritional status . Physical activity . Advanced directives . List of other physicians . Vitals . Screenings to include cognitive, depression, and falls . Referrals and appointments  In addition, I have reviewed and discussed with patient certain preventive protocols, quality metrics, and best practice recommendations. A written personalized care plan for preventive services as well as general preventive health recommendations were provided to patient.     Michiel Cowboy, RN  01/19/2017   Medical screening examination/treatment/procedure(s) were performed by non-physician practitioner and as supervising physician I was immediately available for consultation/collaboration. I agree with above. Binnie Rail, MD

## 2017-01-19 NOTE — Progress Notes (Signed)
Pre visit review using our clinic review tool, if applicable. No additional management support is needed unless otherwise documented below in the visit note. 

## 2017-01-30 ENCOUNTER — Other Ambulatory Visit: Payer: Self-pay | Admitting: Internal Medicine

## 2017-01-30 DIAGNOSIS — I10 Essential (primary) hypertension: Secondary | ICD-10-CM

## 2017-03-14 ENCOUNTER — Telehealth: Payer: Self-pay

## 2017-03-14 MED ORDER — HYDROCHLOROTHIAZIDE 12.5 MG PO CAPS: 12.5000 mg | ORAL_CAPSULE | Freq: Every day | ORAL | 0 refills | Status: DC

## 2017-03-14 NOTE — Telephone Encounter (Signed)
Patient is refusing appt.  Requesting call back from stef.

## 2017-03-14 NOTE — Telephone Encounter (Signed)
Pt needs an appt for refills.  

## 2017-03-15 NOTE — Telephone Encounter (Signed)
Contact patient and informed he needed a 6 month follow up in August.   Pt has scheduled.

## 2017-03-20 ENCOUNTER — Ambulatory Visit: Payer: Medicare Other | Admitting: Internal Medicine

## 2017-06-13 ENCOUNTER — Ambulatory Visit: Payer: Medicare Other | Admitting: Internal Medicine

## 2017-06-13 ENCOUNTER — Other Ambulatory Visit (INDEPENDENT_AMBULATORY_CARE_PROVIDER_SITE_OTHER): Payer: Medicare Other

## 2017-06-13 ENCOUNTER — Encounter: Payer: Self-pay | Admitting: Internal Medicine

## 2017-06-13 VITALS — BP 150/74 | HR 68 | Temp 98.5°F | Ht 66.0 in | Wt 149.5 lb

## 2017-06-13 DIAGNOSIS — N183 Chronic kidney disease, stage 3 unspecified: Secondary | ICD-10-CM

## 2017-06-13 DIAGNOSIS — I10 Essential (primary) hypertension: Secondary | ICD-10-CM | POA: Diagnosis not present

## 2017-06-13 DIAGNOSIS — I739 Peripheral vascular disease, unspecified: Secondary | ICD-10-CM

## 2017-06-13 DIAGNOSIS — E785 Hyperlipidemia, unspecified: Secondary | ICD-10-CM

## 2017-06-13 DIAGNOSIS — D539 Nutritional anemia, unspecified: Secondary | ICD-10-CM

## 2017-06-13 LAB — URINALYSIS, ROUTINE W REFLEX MICROSCOPIC
Bilirubin Urine: NEGATIVE
HGB URINE DIPSTICK: NEGATIVE
KETONES UR: NEGATIVE
LEUKOCYTES UA: NEGATIVE
Nitrite: NEGATIVE
RBC / HPF: NONE SEEN (ref 0–?)
Specific Gravity, Urine: 1.02 (ref 1.000–1.030)
TOTAL PROTEIN, URINE-UPE24: NEGATIVE
URINE GLUCOSE: NEGATIVE
UROBILINOGEN UA: 0.2 (ref 0.0–1.0)
pH: 5.5 (ref 5.0–8.0)

## 2017-06-13 LAB — LIPID PANEL
CHOL/HDL RATIO: 5
Cholesterol: 169 mg/dL (ref 0–200)
HDL: 36.2 mg/dL — AB (ref >39.00)
LDL Cholesterol: 111 mg/dL — ABNORMAL HIGH (ref 0–99)
NONHDL: 132.68
Triglycerides: 109 mg/dL (ref 0.0–149.0)
VLDL: 21.8 mg/dL (ref 0.0–40.0)

## 2017-06-13 LAB — CBC WITH DIFFERENTIAL/PLATELET
BASOS ABS: 0.1 10*3/uL (ref 0.0–0.1)
Basophils Relative: 1 % (ref 0.0–3.0)
EOS ABS: 0.3 10*3/uL (ref 0.0–0.7)
Eosinophils Relative: 2.6 % (ref 0.0–5.0)
HCT: 39.1 % (ref 39.0–52.0)
Hemoglobin: 13.3 g/dL (ref 13.0–17.0)
LYMPHS ABS: 2.2 10*3/uL (ref 0.7–4.0)
Lymphocytes Relative: 20.9 % (ref 12.0–46.0)
MCHC: 33.9 g/dL (ref 30.0–36.0)
MCV: 88.4 fl (ref 78.0–100.0)
MONO ABS: 1.2 10*3/uL — AB (ref 0.1–1.0)
Monocytes Relative: 11.6 % (ref 3.0–12.0)
NEUTROS ABS: 6.8 10*3/uL (ref 1.4–7.7)
Neutrophils Relative %: 63.9 % (ref 43.0–77.0)
PLATELETS: 232 10*3/uL (ref 150.0–400.0)
RBC: 4.42 Mil/uL (ref 4.22–5.81)
RDW: 14.3 % (ref 11.5–15.5)
WBC: 10.7 10*3/uL — ABNORMAL HIGH (ref 4.0–10.5)

## 2017-06-13 LAB — COMPREHENSIVE METABOLIC PANEL
ALT: 16 U/L (ref 0–53)
AST: 13 U/L (ref 0–37)
Albumin: 4 g/dL (ref 3.5–5.2)
Alkaline Phosphatase: 95 U/L (ref 39–117)
BILIRUBIN TOTAL: 0.5 mg/dL (ref 0.2–1.2)
BUN: 30 mg/dL — ABNORMAL HIGH (ref 6–23)
CO2: 26 meq/L (ref 19–32)
Calcium: 9.2 mg/dL (ref 8.4–10.5)
Chloride: 105 mEq/L (ref 96–112)
Creatinine, Ser: 1.65 mg/dL — ABNORMAL HIGH (ref 0.40–1.50)
GFR: 42.73 mL/min — AB (ref >60.00)
GLUCOSE: 109 mg/dL — AB (ref 70–99)
Potassium: 4.5 mEq/L (ref 3.5–5.1)
SODIUM: 139 meq/L (ref 135–145)
Total Protein: 6.9 g/dL (ref 6.0–8.3)

## 2017-06-13 LAB — FOLATE: FOLATE: 14.9 ng/mL (ref >5.9)

## 2017-06-13 LAB — FERRITIN: FERRITIN: 168.9 ng/mL (ref 22.0–322.0)

## 2017-06-13 LAB — IBC PANEL
IRON: 41 ug/dL — AB (ref 42–165)
Saturation Ratios: 12 % — ABNORMAL LOW (ref 20.0–50.0)
TRANSFERRIN: 245 mg/dL (ref 212.0–360.0)

## 2017-06-13 LAB — VITAMIN B12: VITAMIN B 12: 338 pg/mL (ref 211–911)

## 2017-06-13 NOTE — Patient Instructions (Signed)

## 2017-06-13 NOTE — Progress Notes (Signed)
Subjective:  Patient ID: Christopher Rubio., male    DOB: 10/25/1933  Age: 84 y.o. MRN: [id number]  CC: Hypertension and Anemia   HPI Christopher Rubio. presents for f/up - he complains of a vague sense of sensation of pain in both feet and says his feet have felt cold recently.  He tells me his blood pressure has been well controlled and he said no recent episodes of chest pain or shortness of breath.  Outpatient Medications Prior to Visit  Medication Sig Dispense Refill  . Cholecalciferol (VITAMIN D) 2000 UNITS CAPS Take 2,000 Units by mouth daily.    . cilostazol (PLETAL) 100 MG tablet Take 1 tablet (100 mg total) by mouth 2 (two) times daily before a meal. 60 tablet 11  . colchicine 0.6 MG tablet TAKE 1 TABLET BY MOUTH DAILY AS NEEDED FOR GOUT 30 tablet 5  . ezetimibe (ZETIA) 10 MG tablet take 1 tablet by mouth once daily 90 tablet 2  . febuxostat (ULORIC) 40 MG tablet Take 2 tablets (80 mg total) by mouth daily. 90 tablet 1  . hydrochlorothiazide (MICROZIDE) 12.5 MG capsule Take 1 capsule (12.5 mg total) by mouth daily. 90 capsule 0  . lisinopril (PRINIVIL,ZESTRIL) 20 MG tablet take 1 tablet by mouth once daily 90 tablet 3  . tadalafil (CIALIS) 5 MG tablet TAKE 1 TABLET(5 MG) BY MOUTH DAILY AS NEEDED FOR ERECTILE DYSFUNCTION 90 tablet 3   No facility-administered medications prior to visit.     ROS Review of Systems  Constitutional: Negative.  Negative for appetite change, diaphoresis and fatigue.  HENT: Negative.   Eyes: Negative for visual disturbance.  Respiratory: Negative for cough, chest tightness, shortness of breath and wheezing.   Cardiovascular: Negative for chest pain, palpitations and leg swelling.  Gastrointestinal: Negative.  Negative for abdominal pain, constipation, diarrhea, nausea and vomiting.  Endocrine: Negative.   Genitourinary: Negative.  Negative for difficulty urinating, dysuria, hematuria and urgency.  Musculoskeletal: Negative.  Negative for  back pain, myalgias and neck pain.  Skin: Negative.  Negative for color change and rash.  Allergic/Immunologic: Negative.   Neurological: Negative.  Negative for dizziness, weakness, light-headedness and numbness.  Hematological: Negative for adenopathy. Does not bruise/bleed easily.  Psychiatric/Behavioral: Negative.     Objective:  BP (!) 150/74 (BP Location: Left Arm, Patient Position: Sitting, Cuff Size: Normal)   Pulse 68   Temp 98.5 F (36.9 C) (Oral)   Ht 5\' 6"  (1.676 m)   Wt 149 lb 8 oz (67.8 kg)   SpO2 99%   BMI 24.13 kg/m   BP Readings from Last 3 Encounters:  06/13/17 (!) 150/74  01/19/17 (!) 146/74  06/06/16 130/80    Wt Readings from Last 3 Encounters:  06/13/17 149 lb 8 oz (67.8 kg)  01/19/17 145 lb (65.8 kg)  06/06/16 150 lb 8 oz (68.3 kg)    Physical Exam  Constitutional: He is oriented to person, place, and time. No distress.  HENT:  Mouth/Throat: Oropharynx is clear and moist. No oropharyngeal exudate.  Eyes: Conjunctivae are normal. Left eye exhibits no discharge. No scleral icterus.  Neck: Normal range of motion. Neck supple. No JVD present. No thyromegaly present.  Cardiovascular: Normal rate, regular rhythm and normal heart sounds. Exam reveals no gallop.  No murmur heard. Pulses:      Carotid pulses are 1+ on the right side, and 1+ on the left side.      Radial pulses are 1+ on the right side, and  1+ on the left side.       Femoral pulses are 1+ on the right side, and 1+ on the left side.      Popliteal pulses are 0 on the right side, and 0 on the left side.       Dorsalis pedis pulses are 0 on the right side, and 0 on the left side.       Posterior tibial pulses are 0 on the right side, and 0 on the left side.  Pulmonary/Chest: Effort normal and breath sounds normal. No respiratory distress. He has no wheezes. He has no rales.  Abdominal: Soft. Bowel sounds are normal. He exhibits no distension and no mass. There is no tenderness. There is no  guarding.  Musculoskeletal: Normal range of motion. He exhibits no edema, tenderness or deformity.  His feet are cool and pale.  There is diminished capillary refill in all of his toes.  There are no wounds or ulcers.  Lymphadenopathy:    He has no cervical adenopathy.  Neurological: He is alert and oriented to person, place, and time.  Skin: Skin is warm and dry. No rash noted. He is not diaphoretic. No erythema. No pallor.  Vitals reviewed.   Lab Results  Component Value Date   WBC 10.7 (H) 06/13/2017   HGB 13.3 06/13/2017   HCT 39.1 06/13/2017   PLT 232.0 06/13/2017   GLUCOSE 109 (H) 06/13/2017   CHOL 169 06/13/2017   TRIG 109.0 06/13/2017   HDL 36.20 (L) 06/13/2017   LDLDIRECT 131.4 12/09/2013   LDLCALC 111 (H) 06/13/2017   ALT 16 06/13/2017   AST 13 06/13/2017   NA 139 06/13/2017   K 4.5 06/13/2017   CL 105 06/13/2017   CREATININE 1.65 (H) 06/13/2017   BUN 30 (H) 06/13/2017   CO2 26 06/13/2017   TSH 2.62 06/06/2016   PSA 2.40 12/11/2014    Dg Foot Complete Left  Result Date: 11/23/2015 CLINICAL DATA:  Pain.  No known injury.  Initial evaluation. EXAM: LEFT FOOT - COMPLETE 3+ VIEW COMPARISON:  No recent prior . FINDINGS: Diffuse osteopenia and degenerative change. No evidence of fracture or dislocation. Vascular calcification. IMPRESSION: 1.  No acute abnormality.  Diffuse osteopenia degenerative change. 2. Peripheral vascular disease. Electronically Signed   By: Marcello Moores  Register   On: 11/23/2015 09:28    Assessment & Plan:   Rolando was seen today for hypertension and anemia.  Diagnoses and all orders for this visit:  Essential hypertension-his blood pressure is adequately well controlled.  His renal function is stable.  Electrolytes are normal. -     CBC with Differential/Platelet; Future -     Comprehensive metabolic panel; Future -     Urinalysis, Routine w reflex microscopic; Future  Kidney disease, chronic, stage III (GFR 30-59 ml/min) ([place])- His renal  function is stable.  He agrees to avoid nephrotoxic agents.  We will maintain strict blood pressure control. -     Comprehensive metabolic panel; Future -     Urinalysis, Routine w reflex microscopic; Future  Hyperlipidemia with target LDL less than 100- He has not achieved his LDL goal.  He is not willing to take a statin. -     Lipid panel; Future  Deficiency anemia- His H&H are normal now.  I will monitor for vitamin deficiencies. -     CBC with Differential/Platelet; Future -     IBC panel; Future -     Vitamin B12; Future -  Folate; Future -     Ferritin; Future -     Vitamin B1; Future  PAD (peripheral artery disease) ([place])- I have asked him to follow-up with vascular surgery about this. -     Ambulatory referral to Vascular Surgery   I am having Efrem L. Tollie Eth. maintain his Vitamin D, colchicine, febuxostat, tadalafil, cilostazol, ezetimibe, lisinopril, and hydrochlorothiazide.  No orders of the defined types were placed in this encounter.    Follow-up: Return in about 6 months (around 12/14/2017).  Scarlette Calico, MD

## 2017-06-15 ENCOUNTER — Encounter: Payer: Self-pay | Admitting: Internal Medicine

## 2017-06-16 ENCOUNTER — Other Ambulatory Visit: Payer: Self-pay | Admitting: Internal Medicine

## 2017-06-17 ENCOUNTER — Other Ambulatory Visit: Payer: Self-pay | Admitting: Internal Medicine

## 2017-06-17 ENCOUNTER — Encounter: Payer: Self-pay | Admitting: Internal Medicine

## 2017-06-17 DIAGNOSIS — F04 Amnestic disorder due to known physiological condition: Principal | ICD-10-CM

## 2017-06-17 DIAGNOSIS — E538 Deficiency of other specified B group vitamins: Secondary | ICD-10-CM | POA: Insufficient documentation

## 2017-06-17 LAB — VITAMIN B1: Vitamin B1 (Thiamine): 7 nmol/L — ABNORMAL LOW (ref 8–30)

## 2017-06-17 MED ORDER — VITAMIN B-1 100 MG PO TABS: 100.0000 mg | ORAL_TABLET | Freq: Every day | ORAL | 1 refills | Status: DC

## 2017-10-06 ENCOUNTER — Other Ambulatory Visit: Payer: Self-pay | Admitting: Internal Medicine

## 2017-10-06 DIAGNOSIS — E785 Hyperlipidemia, unspecified: Secondary | ICD-10-CM

## 2017-10-06 DIAGNOSIS — I739 Peripheral vascular disease, unspecified: Secondary | ICD-10-CM

## 2017-11-04 ENCOUNTER — Other Ambulatory Visit: Payer: Self-pay | Admitting: Internal Medicine

## 2017-11-15 ENCOUNTER — Other Ambulatory Visit: Payer: Self-pay | Admitting: Vascular Surgery

## 2017-11-15 DIAGNOSIS — I739 Peripheral vascular disease, unspecified: Secondary | ICD-10-CM

## 2017-12-04 ENCOUNTER — Other Ambulatory Visit: Payer: Self-pay | Admitting: Internal Medicine

## 2017-12-04 DIAGNOSIS — N400 Enlarged prostate without lower urinary tract symptoms: Secondary | ICD-10-CM

## 2017-12-04 MED ORDER — TADALAFIL 5 MG PO TABS: ORAL_TABLET | ORAL | 1 refills | Status: DC

## 2017-12-12 ENCOUNTER — Other Ambulatory Visit: Payer: Self-pay | Admitting: Internal Medicine

## 2017-12-27 ENCOUNTER — Other Ambulatory Visit: Payer: Self-pay | Admitting: Internal Medicine

## 2017-12-27 DIAGNOSIS — E538 Deficiency of other specified B group vitamins: Secondary | ICD-10-CM

## 2017-12-27 DIAGNOSIS — F04 Amnestic disorder due to known physiological condition: Principal | ICD-10-CM

## 2018-01-05 ENCOUNTER — Telehealth: Payer: Self-pay | Admitting: Internal Medicine

## 2018-01-05 MED ORDER — COLCHICINE 0.6 MG PO TABS: 0.6000 mg | ORAL_TABLET | Freq: Every day | ORAL | 5 refills | Status: DC

## 2018-01-05 NOTE — Telephone Encounter (Signed)
Copied from [place] [id number]. Topic: Quick Communication - Rx Refill/Question >> Jan 05, 2018 10:34 AM Yvette Rack wrote: Medication: colchicine 0.6 MG tablet   Has the patient contacted their pharmacy? yes   Preferred Pharmacy (with phone number or street name): [facility] [phone] - [place], [place] - [address] [phone] (Phone) [phone] (Fax)  Agent: Please be advised that RX refills may take up to 3 business days. We ask that you follow-up with your pharmacy.

## 2018-01-05 NOTE — Telephone Encounter (Signed)
Erx has been sent. 

## 2018-01-05 NOTE — Telephone Encounter (Signed)
Colchicine 0.6mg  tab refill Last Refill:04/02/15 #30 with 5 refills Last OV: 06/13/17 Next OV: 01/25/18 PCP: dr. [name] Pharmacy:[facility] on [address] in [place]

## 2018-01-24 NOTE — Progress Notes (Addendum)
Subjective:   Christopher Rubio. is a 84 y.o. male who presents for Medicare Annual/Subsequent preventive examination.  Review of Systems:  No ROS.  Medicare Wellness Visit. Additional risk factors are reflected in the social history.  Cardiac Risk Factors include: male gender;hypertension;advanced age (>64men, >19 women) Sleep patterns: feels rested on waking, gets up 1 times nightly to void and sleeps 8-9 hours nightly.    Home Safety/Smoke Alarms: Feels safe in home. Smoke alarms in place.  Living environment; residence and Firearm Safety: 2-story house, no firearms. Lives alone, no needs for DME, good support system Seat Belt Safety/Bike Helmet: Wears seat belt.     Objective:    Vitals: BP (!) 146/74   Pulse (!) 50   Resp 18   Ht 5\' 6"  (1.676 m)   Wt 145 lb (65.8 kg)   SpO2 99%   BMI 23.40 kg/m   Body mass index is 23.4 kg/m.  Advanced Directives 01/25/2018 01/19/2017 11/18/2015 02/17/2015 12/03/2014  Does Patient Have a Medical Advance Directive? Yes Yes Yes Yes Yes  Type of Paramedic of [place];Living will [place];Living will - [place];Living will -  Copy of [place] in Chart? No - copy requested No - copy requested - No - copy requested Yes  Would patient like information on creating a medical advance directive? - - Yes - Educational materials given - -    Tobacco Social History   Tobacco Use  Smoking Status Current Every Day Smoker  . Packs/day: 1.00  . Years: 60.00  . Pack years: 60.00  . Types: Cigarettes  Smokeless Tobacco Never Used  Tobacco Comment   explained LDCT; smokes more since wife died;      Ready to quit: No Counseling given: No Comment: explained LDCT; smokes more since wife died;   Past Medical History:  Diagnosis Date  . Basal cell carcinoma of skin, site unspecified   . Benign neoplasm of colon   . Contact dermatitis and other eczema, due to  unspecified cause   . Diverticulosis of colon (without mention of hemorrhage)   . Extrinsic asthma, unspecified   . Gout, unspecified   . Impotence of organic origin   . Other and unspecified hyperlipidemia   . Unspecified essential hypertension    Past Surgical History:  Procedure Laterality Date  . TONSILLECTOMY     Family History  Problem Relation Age of Onset  . Coronary artery disease Father 57       deceased  . Heart attack Father   . Stroke Mother   . Hypertension Mother   . Diabetes Neg Hx   . Cancer Neg Hx        colon, prostate   Social History   Socioeconomic History  . Marital status: Married    Spouse name: Not on file  . Number of children: 2  . Years of education: Not on file  . Highest education level: Not on file  Occupational History  . Occupation: retired Tour manager  . Financial resource strain: Not hard at all  . Food insecurity:    Worry: Never true    Inability: Never true  . Transportation needs:    Medical: No    Non-medical: No  Tobacco Use  . Smoking status: Current Every Day Smoker    Packs/day: 1.00    Years: 60.00    Pack years: 60.00    Types: Cigarettes  . Smokeless  tobacco: Never Used  . Tobacco comment: explained LDCT; smokes more since wife died;   Substance and Sexual Activity  . Alcohol use: Yes    Alcohol/week: 10.0 standard drinks    Types: 10 Cans of beer per week    Comment: Rarely drink anything  . Drug use: No  . Sexual activity: Not Currently  Lifestyle  . Physical activity:    Days per week: 4 days    Minutes per session: 50 min  . Stress: Not at all  Relationships  . Social connections:    Talks on phone: More than three times a week    Gets together: More than three times a week    Attends religious service: 1 to 4 times per year    Active member of club or organization: Yes    Attends meetings of clubs or organizations: More than 4 times per year    Relationship status: Widowed  Other Topics  Concern  . Not on file  Social History Narrative   HSG, [place], [place]; [employer]. Married '62. 2 daughters - '67, '70: no grandchildren. Retired-college Music therapist. Enjoys retirement: golf, gardening, woodworking. Marriage in good health             Outpatient Encounter Medications as of 01/25/2018  Medication Sig  . Cholecalciferol (VITAMIN D) 2000 UNITS CAPS Take 2,000 Units by mouth daily.  . cilostazol (PLETAL) 100 MG tablet TAKE 1 TABLET BY MOUTH TWICE DAILY BEFORE MEALS  . colchicine 0.6 MG tablet Take 1 tablet (0.6 mg total) by mouth daily.  Marland Kitchen ezetimibe (ZETIA) 10 MG tablet TAKE 1 TABLET BY MOUTH ONCE DAILY  . hydrochlorothiazide (MICROZIDE) 12.5 MG capsule TAKE 1 CAPSULE BY MOUTH ONCE DAILY  . lisinopril (PRINIVIL,ZESTRIL) 20 MG tablet take 1 tablet by mouth once daily  . tadalafil (CIALIS) 5 MG tablet TAKE 1 TABLET(5 MG) BY MOUTH DAILY AS NEEDED FOR ERECTILE DYSFUNCTION  . thiamine (VITAMIN B-1) 100 MG tablet TAKE 1 TABLET(100 MG) BY MOUTH DAILY   No facility-administered encounter medications on file as of 01/25/2018.     Activities of Daily Living In your present state of health, do you have any difficulty performing the following activities: 01/25/2018  Hearing? N  Vision? N  Difficulty concentrating or making decisions? N  Walking or climbing stairs? N  Dressing or bathing? N  Doing errands, shopping? N  Preparing Food and eating ? N  Using the Toilet? N  In the past six months, have you accidently leaked urine? N  Do you have problems with loss of bowel control? N  Managing your Medications? N  Managing your Finances? N  Housekeeping or managing your Housekeeping? N  Some recent data might be hidden    Patient Care Team: Christopher Lima, MD as PCP - General (Internal Medicine) Early, Christopher Meres, MD as Consulting Physician (Vascular Surgery)   Assessment:   This is a routine wellness examination for [name]. Physical assessment  deferred to PCP.  Exercise Activities and Dietary recommendations Current Exercise Habits: Home exercise routine;Structured exercise class, Type of exercise: yoga;walking;calisthenics, Time (Minutes): 50, Frequency (Times/Week): 4, Weekly Exercise (Minutes/Week): 200, Intensity: Mild, Exercise limited by: None identified  Diet (meal preparation, eat out, water intake, caffeinated beverages, dairy products, fruits and vegetables): in general, a "healthy" diet  , well balanced . eats a variety of fruits and vegetables daily, limits salt, fat/cholesterol, sugar,carbohydrates,caffeine, drinks 6-8 glasses of water daily.   Discussed supplementing with Ensure for weight gain,  samples and coupons provided.  Goals    . Exercise 150 minutes per week (moderate activity)     Will start weight program at home Loss of muscle tone; recommended exercise with the 5 lb weights he has or can get exercise rubber bands or stretch bands      . patient     Will add some calories to diet in the am Peanut butter; banana; cereal; blueberries  Gain weight Add honey; Eat nuts; (mixed) almonds or walnuts  Ice cream;  Protein bar       . Patient Stated    . Patient Stated      Stay active physically and socially. Continue to eat healthy and exercise.    . Stay as healthy and as independent as possible     Continue to exercise, eat healthy, and take care of me the best I can.     . will continue yoga     Will continue to embrace yoga and experiment with other forms of relaxation       Fall Risk Fall Risk  01/25/2018 01/19/2017 11/18/2015 12/12/2014 12/03/2014  Falls in the past year? No No No No No    Depression Screen PHQ 2/9 Scores 01/25/2018 01/19/2017 11/18/2015 12/12/2014  PHQ - 2 Score 0 1 0 0  PHQ- 9 Score - 2 - -    Cognitive Function MMSE - Mini Mental State Exam 01/25/2018 01/19/2017 11/18/2015  Not completed: - - (No Data)  Orientation to time 5 5 -  Orientation to Place 5 5 -    Registration 3 3 -  Attention/ Calculation 5 5 -  Recall 2 2 -  Language- name 2 objects 2 2 -  Language- repeat 1 1 -  Language- follow 3 step command 3 3 -  Language- read & follow direction 1 1 -  Write a sentence 1 1 -  Copy design 1 1 -  Total score 29 29 -        Immunization History  Administered Date(s) Administered  . Influenza, High Dose Seasonal PF 12/11/2014, 01/26/2016, 01/19/2017, 01/25/2018  . Influenza,inj,Quad PF,6+ Mos 02/25/2013, 12/30/2013  . Pneumococcal Conjugate-13 03/13/2014  . Pneumococcal Polysaccharide-23 12/26/2012  . Td 08/10/2008  . Zoster 02/13/2010   Screening Tests Health Maintenance  Topic Date Due  . INFLUENZA VACCINE  11/09/2017  . TETANUS/TDAP  08/11/2018  . PNA vac Low Risk Adult  Completed      Plan:    Continue doing brain stimulating activities (puzzles, reading, adult coloring books, staying active) to keep memory sharp.   Continue to eat heart healthy diet (full of fruits, vegetables, whole grains, lean protein, water--limit salt, fat, and sugar intake) and increase physical activity as tolerated.  I have personally reviewed and noted the following in the patient's chart:   . Medical and social history . Use of alcohol, tobacco or illicit drugs  . Current medications and supplements . Functional ability and status . Nutritional status . Physical activity . Advanced directives . List of other physicians . Vitals . Screenings to include cognitive, depression, and falls . Referrals and appointments  In addition, I have reviewed and discussed with patient certain preventive protocols, quality metrics, and best practice recommendations. A written personalized care plan for preventive services as well as general preventive health recommendations were provided to patient.     Michiel Cowboy, RN  01/25/2018  Medical screening examination/treatment/procedure(s) were performed by non-physician practitioner and as supervising  physician I was immediately  available for consultation/collaboration. I agree with above. Scarlette Calico, MD

## 2018-01-25 ENCOUNTER — Ambulatory Visit (INDEPENDENT_AMBULATORY_CARE_PROVIDER_SITE_OTHER): Payer: Medicare Other | Admitting: *Deleted

## 2018-01-25 VITALS — BP 146/74 | HR 50 | Resp 18 | Ht 66.0 in | Wt 145.0 lb

## 2018-01-25 DIAGNOSIS — Z0000 Encounter for general adult medical examination without abnormal findings: Secondary | ICD-10-CM | POA: Diagnosis not present

## 2018-01-25 DIAGNOSIS — Z23 Encounter for immunization: Secondary | ICD-10-CM | POA: Diagnosis not present

## 2018-01-25 NOTE — Patient Instructions (Addendum)
Continue doing brain stimulating activities (puzzles, reading, adult coloring books, staying active) to keep memory sharp.   Continue to eat heart healthy diet (full of fruits, vegetables, whole grains, lean protein, water--limit salt, fat, and sugar intake) and increase physical activity as tolerated.   Christopher Rubio , Thank you for taking time to come for your Medicare Wellness Visit. I appreciate your ongoing commitment to your health goals. Please review the following plan we discussed and let me know if I can assist you in the future.   These are the goals we discussed: Goals    . Exercise 150 minutes per week (moderate activity)     Will start weight program at home Loss of muscle tone; recommended exercise with the 5 lb weights he has or can get exercise rubber bands or stretch bands      . patient     Will add some calories to diet in the am Peanut butter; banana; cereal; blueberries  Gain weight Add honey; Eat nuts; (mixed) almonds or walnuts  Ice cream;  Protein bar       . Patient Stated    . Patient Stated      Stay active physically and socially. Continue to eat healthy and exercise.    . Stay as healthy and as independent as possible     Continue to exercise, eat healthy, and take care of me the best I can.     . will continue yoga     Will continue to embrace yoga and experiment with other forms of relaxation       This is a list of the screening recommended for you and due dates:  Health Maintenance  Topic Date Due  . Flu Shot  11/09/2017  . Tetanus Vaccine  08/11/2018  . Pneumonia vaccines  Completed   Influenza Virus Vaccine injection What is this medicine? INFLUENZA VIRUS VACCINE (in floo EN zuh VAHY ruhs vak SEEN) helps to reduce the risk of getting influenza also known as the flu. The vaccine only helps protect you against some strains of the flu. This medicine may be used for other purposes; ask your health care provider or pharmacist if you have  questions. COMMON BRAND NAME(S): Afluria, Agriflu, Alfuria, FLUAD, Fluarix, Fluarix Quadrivalent, Flublok, Flublok Quadrivalent, FLUCELVAX, Flulaval, Fluvirin, Fluzone, Fluzone High-Dose, Fluzone Intradermal What should I tell my health care provider before I take this medicine? They need to know if you have any of these conditions: -bleeding disorder like hemophilia -fever or infection -Guillain-Barre syndrome or other neurological problems -immune system problems -infection with the human immunodeficiency virus (HIV) or AIDS -low blood platelet counts -multiple sclerosis -an unusual or allergic reaction to influenza virus vaccine, latex, other medicines, foods, dyes, or preservatives. Different brands of vaccines contain different allergens. Some may contain latex or eggs. Talk to your doctor about your allergies to make sure that you get the right vaccine. -pregnant or trying to get pregnant -breast-feeding How should I use this medicine? This vaccine is for injection into a muscle or under the skin. It is given by a health care professional. A copy of Vaccine Information Statements will be given before each vaccination. Read this sheet carefully each time. The sheet may change frequently. Talk to your healthcare provider to see which vaccines are right for you. Some vaccines should not be used in all age groups. Overdosage: If you think you have taken too much of this medicine contact a poison control center or emergency room at once.  NOTE: This medicine is only for you. Do not share this medicine with others. What if I miss a dose? This does not apply. What may interact with this medicine? -chemotherapy or radiation therapy -medicines that lower your immune system like etanercept, anakinra, infliximab, and adalimumab -medicines that treat or prevent blood clots like warfarin -phenytoin -steroid medicines like prednisone or cortisone -theophylline -vaccines This list may not  describe all possible interactions. Give your health care provider a list of all the medicines, herbs, non-prescription drugs, or dietary supplements you use. Also tell them if you smoke, drink alcohol, or use illegal drugs. Some items may interact with your medicine. What should I watch for while using this medicine? Report any side effects that do not go away within 3 days to your doctor or health care professional. Call your health care provider if any unusual symptoms occur within 6 weeks of receiving this vaccine. You may still catch the flu, but the illness is not usually as bad. You cannot get the flu from the vaccine. The vaccine will not protect against colds or other illnesses that may cause fever. The vaccine is needed every year. What side effects may I notice from receiving this medicine? Side effects that you should report to your doctor or health care professional as soon as possible: -allergic reactions like skin rash, itching or hives, swelling of the face, lips, or tongue Side effects that usually do not require medical attention (report to your doctor or health care professional if they continue or are bothersome): -fever -headache -muscle aches and pains -pain, tenderness, redness, or swelling at the injection site -tiredness This list may not describe all possible side effects. Call your doctor for medical advice about side effects. You may report side effects to FDA at 1-800-FDA-1088. Where should I keep my medicine? The vaccine will be given by a health care professional in a clinic, pharmacy, doctor's office, or other health care setting. You will not be given vaccine doses to store at home. NOTE: This sheet is a summary. It may not cover all possible information. If you have questions about this medicine, talk to your doctor, pharmacist, or health care provider.  2018 Elsevier/Gold Standard (2014-10-17 10:07:28)   Health Maintenance, Male A healthy lifestyle and  preventive care is important for your health and wellness. Ask your health care provider about what schedule of regular examinations is right for you. What should I know about weight and diet? Eat a Healthy Diet  Eat plenty of vegetables, fruits, whole grains, low-fat dairy products, and lean protein.  Do not eat a lot of foods high in solid fats, added sugars, or salt.  Maintain a Healthy Weight Regular exercise can help you achieve or maintain a healthy weight. You should:  Do at least 150 minutes of exercise each week. The exercise should increase your heart rate and make you sweat (moderate-intensity exercise).  Do strength-training exercises at least twice a week.  Watch Your Levels of Cholesterol and Blood Lipids  Have your blood tested for lipids and cholesterol every 5 years starting at 82 years of age. If you are at high risk for heart disease, you should start having your blood tested when you are 82 years old. You may need to have your cholesterol levels checked more often if: ? Your lipid or cholesterol levels are high. ? You are older than 82 years of age. ? You are at high risk for heart disease.  What should I know about cancer screening?  Many types of cancers can be detected early and may often be prevented. Lung Cancer  You should be screened every year for lung cancer if: ? You are a current smoker who has smoked for at least 30 years. ? You are a former smoker who has quit within the past 15 years.  Talk to your health care provider about your screening options, when you should start screening, and how often you should be screened.  Colorectal Cancer  Routine colorectal cancer screening usually begins at 82 years of age and should be repeated every 5-10 years until you are 82 years old. You may need to be screened more often if early forms of precancerous polyps or small growths are found. Your health care provider may recommend screening at an earlier age if you  have risk factors for colon cancer.  Your health care provider may recommend using home test kits to check for hidden blood in the stool.  A small camera at the end of a tube can be used to examine your colon (sigmoidoscopy or colonoscopy). This checks for the earliest forms of colorectal cancer.  Prostate and Testicular Cancer  Depending on your age and overall health, your health care provider may do certain tests to screen for prostate and testicular cancer.  Talk to your health care provider about any symptoms or concerns you have about testicular or prostate cancer.  Skin Cancer  Check your skin from head to toe regularly.  Tell your health care provider about any new moles or changes in moles, especially if: ? There is a change in a mole's size, shape, or color. ? You have a mole that is larger than a pencil eraser.  Always use sunscreen. Apply sunscreen liberally and repeat throughout the day.  Protect yourself by wearing long sleeves, pants, a wide-brimmed hat, and sunglasses when outside.  What should I know about heart disease, diabetes, and high blood pressure?  If you are 37-68 years of age, have your blood pressure checked every 3-5 years. If you are 58 years of age or older, have your blood pressure checked every year. You should have your blood pressure measured twice-once when you are at a hospital or clinic, and once when you are not at a hospital or clinic. Record the average of the two measurements. To check your blood pressure when you are not at a hospital or clinic, you can use: ? An automated blood pressure machine at a pharmacy. ? A home blood pressure monitor.  Talk to your health care provider about your target blood pressure.  If you are between 63-38 years old, ask your health care provider if you should take aspirin to prevent heart disease.  Have regular diabetes screenings by checking your fasting blood sugar level. ? If you are at a normal weight and  have a low risk for diabetes, have this test once every three years after the age of 27. ? If you are overweight and have a high risk for diabetes, consider being tested at a younger age or more often.  A one-time screening for abdominal aortic aneurysm (AAA) by ultrasound is recommended for men aged 40-75 years who are current or former smokers. What should I know about preventing infection? Hepatitis B If you have a higher risk for hepatitis B, you should be screened for this virus. Talk with your health care provider to find out if you are at risk for hepatitis B infection. Hepatitis C Blood testing is recommended for:  Everyone born from [birth year] through 1963.  Anyone with known risk factors for hepatitis C.  Sexually Transmitted Diseases (STDs)  You should be screened each year for STDs including gonorrhea and chlamydia if: ? You are sexually active and are younger than 82 years of age. ? You are older than 82 years of age and your health care provider tells you that you are at risk for this type of infection. ? Your sexual activity has changed since you were last screened and you are at an increased risk for chlamydia or gonorrhea. Ask your health care provider if you are at risk.  Talk with your health care provider about whether you are at high risk of being infected with HIV. Your health care provider may recommend a prescription medicine to help prevent HIV infection.  What else can I do?  Schedule regular health, dental, and eye exams.  Stay current with your vaccines (immunizations).  Do not use any tobacco products, such as cigarettes, chewing tobacco, and e-cigarettes. If you need help quitting, ask your health care provider.  Limit alcohol intake to no more than 2 drinks per day. One drink equals 12 ounces of beer, 5 ounces of [place] Sp[place]fora, or 1 ounces of hard liquor.  Do not use street drugs.  Do not share needles.  Ask your health care provider for help if you need support  or information about quitting drugs.  Tell your health care provider if you often feel depressed.  Tell your health care provider if you have ever been abused or do not feel safe at home. This information is not intended to replace advice given to you by your health care provider. Make sure you discuss any questions you have with your health care provider. Document Released: 09/24/2007 Document Revised: 11/25/2015 Document Reviewed: 12/30/2014 Elsevier Interactive Patient Education  [employer].

## 2018-02-20 ENCOUNTER — Other Ambulatory Visit: Payer: Self-pay | Admitting: Internal Medicine

## 2018-02-20 DIAGNOSIS — I10 Essential (primary) hypertension: Secondary | ICD-10-CM

## 2018-03-22 ENCOUNTER — Other Ambulatory Visit: Payer: Self-pay | Admitting: Internal Medicine

## 2018-04-24 ENCOUNTER — Other Ambulatory Visit: Payer: Self-pay | Admitting: Internal Medicine

## 2018-04-24 DIAGNOSIS — I739 Peripheral vascular disease, unspecified: Secondary | ICD-10-CM

## 2018-04-24 DIAGNOSIS — E785 Hyperlipidemia, unspecified: Secondary | ICD-10-CM

## 2018-05-03 ENCOUNTER — Other Ambulatory Visit: Payer: Self-pay | Admitting: Vascular Surgery

## 2018-05-03 DIAGNOSIS — I739 Peripheral vascular disease, unspecified: Secondary | ICD-10-CM

## 2018-05-08 ENCOUNTER — Encounter: Payer: Self-pay | Admitting: Internal Medicine

## 2018-05-08 ENCOUNTER — Other Ambulatory Visit (INDEPENDENT_AMBULATORY_CARE_PROVIDER_SITE_OTHER): Payer: Medicare Other

## 2018-05-08 ENCOUNTER — Ambulatory Visit: Payer: Medicare Other | Admitting: Internal Medicine

## 2018-05-08 VITALS — BP 152/78 | HR 65 | Temp 98.2°F | Resp 16 | Ht 66.0 in | Wt 152.0 lb

## 2018-05-08 DIAGNOSIS — N183 Chronic kidney disease, stage 3 unspecified: Secondary | ICD-10-CM

## 2018-05-08 DIAGNOSIS — E538 Deficiency of other specified B group vitamins: Secondary | ICD-10-CM

## 2018-05-08 DIAGNOSIS — F04 Amnestic disorder due to known physiological condition: Secondary | ICD-10-CM

## 2018-05-08 DIAGNOSIS — R0989 Other specified symptoms and signs involving the circulatory and respiratory systems: Secondary | ICD-10-CM

## 2018-05-08 DIAGNOSIS — N138 Other obstructive and reflux uropathy: Secondary | ICD-10-CM

## 2018-05-08 DIAGNOSIS — I10 Essential (primary) hypertension: Secondary | ICD-10-CM | POA: Diagnosis not present

## 2018-05-08 DIAGNOSIS — N401 Enlarged prostate with lower urinary tract symptoms: Secondary | ICD-10-CM

## 2018-05-08 LAB — BASIC METABOLIC PANEL
BUN: 29 mg/dL — ABNORMAL HIGH (ref 6–23)
CO2: 26 mEq/L (ref 19–32)
Calcium: 9.5 mg/dL (ref 8.4–10.5)
Chloride: 105 mEq/L (ref 96–112)
Creatinine, Ser: 1.63 mg/dL — ABNORMAL HIGH (ref 0.40–1.50)
GFR: 40.68 mL/min — ABNORMAL LOW (ref >60.00)
Glucose, Bld: 82 mg/dL (ref 70–99)
Potassium: 4.8 mEq/L (ref 3.5–5.1)
Sodium: 138 mEq/L (ref 135–145)

## 2018-05-08 LAB — CBC WITH DIFFERENTIAL/PLATELET
Basophils Absolute: 0.2 10*3/uL — ABNORMAL HIGH (ref 0.0–0.1)
Basophils Relative: 1.8 % (ref 0.0–3.0)
Eosinophils Absolute: 0.4 10*3/uL (ref 0.0–0.7)
Eosinophils Relative: 3.9 % (ref 0.0–5.0)
HCT: 37.9 % — ABNORMAL LOW (ref 39.0–52.0)
Hemoglobin: 12.9 g/dL — ABNORMAL LOW (ref 13.0–17.0)
Lymphocytes Relative: 23.5 % (ref 12.0–46.0)
Lymphs Abs: 2.4 10*3/uL (ref 0.7–4.0)
MCHC: 34.1 g/dL (ref 30.0–36.0)
MCV: 89.6 fl (ref 78.0–100.0)
Monocytes Absolute: 1.2 10*3/uL — ABNORMAL HIGH (ref 0.1–1.0)
Monocytes Relative: 12.2 % — ABNORMAL HIGH (ref 3.0–12.0)
NEUTROS ABS: 6 10*3/uL (ref 1.4–7.7)
Neutrophils Relative %: 58.6 % (ref 43.0–77.0)
Platelets: 249 10*3/uL (ref 150.0–400.0)
RBC: 4.23 Mil/uL (ref 4.22–5.81)
RDW: 14.6 % (ref 11.5–15.5)
WBC: 10.2 10*3/uL (ref 4.0–10.5)

## 2018-05-08 MED ORDER — SILODOSIN 4 MG PO CAPS: 4.0000 mg | ORAL_CAPSULE | Freq: Every day | ORAL | 1 refills | Status: DC

## 2018-05-08 MED ORDER — HYDROCHLOROTHIAZIDE 12.5 MG PO CAPS: 12.5000 mg | ORAL_CAPSULE | Freq: Every day | ORAL | 0 refills | Status: DC

## 2018-05-08 NOTE — Patient Instructions (Signed)

## 2018-05-08 NOTE — Progress Notes (Signed)
Subjective:  Patient ID: Christopher Rubio., male    DOB: [birth year]  Age: 85 y.o. MRN: [id number]  CC: Hypertension   HPI Anson Peddie. presents for a BP check - He tells me his blood pressure has been well controlled.  He complains of a weak urine stream with straining and wants to know if there is a medication that can help with his urine flow.  Outpatient Medications Prior to Visit  Medication Sig Dispense Refill  . Cholecalciferol (VITAMIN D) 2000 UNITS CAPS Take 2,000 Units by mouth daily.    . cilostazol (PLETAL) 100 MG tablet TAKE 1 TABLET BY MOUTH TWICE DAILY BEFORE MEALS 180 tablet 0  . colchicine 0.6 MG tablet Take 1 tablet (0.6 mg total) by mouth daily. 30 tablet 5  . ezetimibe (ZETIA) 10 MG tablet TAKE 1 TABLET BY MOUTH ONCE DAILY 90 tablet 1  . lisinopril (PRINIVIL,ZESTRIL) 20 MG tablet TAKE 1 TABLET BY MOUTH ONCE DAILY 90 tablet 0  . tadalafil (CIALIS) 5 MG tablet TAKE 1 TABLET(5 MG) BY MOUTH DAILY AS NEEDED FOR ERECTILE DYSFUNCTION 90 tablet 1  . thiamine (VITAMIN B-1) 100 MG tablet TAKE 1 TABLET(100 MG) BY MOUTH DAILY 100 tablet 1  . hydrochlorothiazide (MICROZIDE) 12.5 MG capsule TAKE 1 CAPSULE BY MOUTH ONCE DAILY 90 capsule 0   No facility-administered medications prior to visit.     ROS Review of Systems  Constitutional: Negative.  Negative for diaphoresis and fatigue.  HENT: Negative.   Eyes: Negative for visual disturbance.  Respiratory: Negative for cough, chest tightness, shortness of breath and wheezing.   Cardiovascular: Negative for chest pain, palpitations and leg swelling.  Gastrointestinal: Negative for abdominal pain, constipation, diarrhea, nausea and vomiting.  Genitourinary: Positive for difficulty urinating. Negative for decreased urine volume, dysuria, flank pain, frequency, hematuria and urgency.  Musculoskeletal: Negative.  Negative for arthralgias and myalgias.  Skin: Negative.  Negative for pallor.  Neurological: Negative.   Negative for dizziness, weakness and light-headedness.  Hematological: Negative for adenopathy. Does not bruise/bleed easily.  Psychiatric/Behavioral: Negative.     Objective:  BP (!) 152/78 (BP Location: Left Arm, Patient Position: Sitting, Cuff Size: Normal)   Pulse 65   Temp 98.2 F (36.8 C) (Oral)   Resp 16   Ht 5\' 6"  (1.676 m)   Wt 152 lb (68.9 kg)   SpO2 97%   BMI 24.53 kg/m   BP Readings from Last 3 Encounters:  05/08/18 (!) 152/78  01/25/18 (!) 146/74  06/13/17 (!) 150/74    Wt Readings from Last 3 Encounters:  05/08/18 152 lb (68.9 kg)  01/25/18 145 lb (65.8 kg)  06/13/17 149 lb 8 oz (67.8 kg)    Physical Exam Vitals signs reviewed.  Constitutional:      Appearance: He is not ill-appearing or diaphoretic.  HENT:     Nose: Nose normal. No congestion or rhinorrhea.     Mouth/Throat:     Mouth: Mucous membranes are moist.     Pharynx: No oropharyngeal exudate or posterior oropharyngeal erythema.  Eyes:     General: No scleral icterus.    Conjunctiva/sclera: Conjunctivae normal.  Neck:     Musculoskeletal: Normal range of motion and neck supple.  Cardiovascular:     Rate and Rhythm: Normal rate and regular rhythm.     Heart sounds: Murmur present. Systolic murmur present with a grade of 2/6. No diastolic murmur. No friction rub. No gallop. No S3 or S4 sounds.   Pulmonary:  Effort: Pulmonary effort is normal.     Breath sounds: Normal breath sounds. No stridor. No wheezing, rhonchi or rales.  Abdominal:     General: Abdomen is flat. Bowel sounds are normal. There is abdominal bruit.     Palpations: There is mass. There is no hepatomegaly or splenomegaly.     Tenderness: There is no abdominal tenderness.     Hernia: No hernia is present.  Musculoskeletal:        General: No swelling.     Right lower leg: No edema.     Left lower leg: No edema.  Skin:    General: Skin is warm and dry.  Neurological:     General: No focal deficit present.     Mental  Status: He is oriented to person, place, and time. Mental status is at baseline.     Lab Results  Component Value Date   WBC 10.2 05/08/2018   HGB 12.9 (L) 05/08/2018   HCT 37.9 (L) 05/08/2018   PLT 249.0 05/08/2018   GLUCOSE 82 05/08/2018   CHOL 169 06/13/2017   TRIG 109.0 06/13/2017   HDL 36.20 (L) 06/13/2017   LDLDIRECT 131.4 12/09/2013   LDLCALC 111 (H) 06/13/2017   ALT 16 06/13/2017   AST 13 06/13/2017   NA 138 05/08/2018   K 4.8 05/08/2018   CL 105 05/08/2018   CREATININE 1.63 (H) 05/08/2018   BUN 29 (H) 05/08/2018   CO2 26 05/08/2018   TSH 2.62 06/06/2016   PSA 2.40 12/11/2014    Dg Foot Complete Left  Result Date: 11/23/2015 CLINICAL DATA:  Pain.  No known injury.  Initial evaluation. EXAM: LEFT FOOT - COMPLETE 3+ VIEW COMPARISON:  No recent prior . FINDINGS: Diffuse osteopenia and degenerative change. No evidence of fracture or dislocation. Vascular calcification. IMPRESSION: 1.  No acute abnormality.  Diffuse osteopenia degenerative change. 2. Peripheral vascular disease. Electronically Signed   By: Marcello Moores  Register   On: 11/23/2015 09:28    Assessment & Plan:   Stirling was seen today for hypertension.  Diagnoses and all orders for this visit:  Essential hypertension- His blood pressure is adequately well controlled.  Electrolytes and renal function are normal.  Will continue the current combination of an ACE inhibitor and thiazide diuretic. -     hydrochlorothiazide (MICROZIDE) 12.5 MG capsule; Take 1 capsule (12.5 mg total) by mouth daily. -     Basic metabolic panel; Future  Kidney disease, chronic, stage III (GFR 30-59 ml/min) ([place])- His renal function is stable.  He will avoid nephrotoxic agents. 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  He tells me he is taking his B1 supplement.  I will monitor his thiamine level. -     CBC  with Differential/Platelet; Future -     Vitamin B1; Future  Abdominal bruit- I will screen him for AAA. -     VAS [place] AAA DUPLEX; Future  BPH with obstruction/lower urinary tract symptoms- I have asked him to try an alpha-blocker for symptom relief. -     silodosin (RAPAFLO) 4 MG CAPS capsule; Take 1 capsule (4 mg total) by mouth daily with breakfast.   I have changed Stancil L. Caton Jr.'s hydrochlorothiazide. I am also having him start on silodosin. Additionally, I am having him maintain his Vitamin D, tadalafil, thiamine, colchicine, lisinopril, ezetimibe, and cilostazol.  Meds ordered this  encounter  Medications  . hydrochlorothiazide (MICROZIDE) 12.5 MG capsule    Sig: Take 1 capsule (12.5 mg total) by mouth daily.    Dispense:  90 capsule    Refill:  0  . silodosin (RAPAFLO) 4 MG CAPS capsule    Sig: Take 1 capsule (4 mg total) by mouth daily with breakfast.    Dispense:  90 capsule    Refill:  1     Follow-up: Return in about 6 months (around 11/06/2018).  Scarlette Calico, MD

## 2018-05-09 ENCOUNTER — Telehealth (HOSPITAL_COMMUNITY): Payer: Self-pay | Admitting: *Deleted

## 2018-05-09 NOTE — Telephone Encounter (Signed)
05/09/18 no answer- attempting to reach pt to schedule appt

## 2018-05-11 ENCOUNTER — Other Ambulatory Visit: Payer: Self-pay | Admitting: Internal Medicine

## 2018-05-11 ENCOUNTER — Encounter: Payer: Self-pay | Admitting: Internal Medicine

## 2018-05-11 DIAGNOSIS — E538 Deficiency of other specified B group vitamins: Secondary | ICD-10-CM

## 2018-05-11 DIAGNOSIS — F04 Amnestic disorder due to known physiological condition: Principal | ICD-10-CM

## 2018-05-11 LAB — VITAMIN B1: Vitamin B1 (Thiamine): 49 nmol/L — ABNORMAL HIGH (ref 8–30)

## 2018-05-11 MED ORDER — VITAMIN B-1 50 MG PO TABS: 50.0000 mg | ORAL_TABLET | Freq: Every day | ORAL | 0 refills | Status: DC

## 2018-05-14 ENCOUNTER — Encounter: Payer: Self-pay | Admitting: Internal Medicine

## 2018-05-17 ENCOUNTER — Ambulatory Visit (HOSPITAL_COMMUNITY)
Admission: RE | Admit: 2018-05-17 | Discharge: 2018-05-17 | Disposition: A | Discharge status: Home / Self Care | Payer: Medicare Other | Source: Ambulatory Visit | Attending: Vascular Surgery | Admitting: Vascular Surgery

## 2018-05-17 ENCOUNTER — Other Ambulatory Visit: Payer: Self-pay | Admitting: Internal Medicine

## 2018-05-17 DIAGNOSIS — I771 Stricture of artery: Secondary | ICD-10-CM | POA: Insufficient documentation

## 2018-05-17 DIAGNOSIS — R0989 Other specified symptoms and signs involving the circulatory and respiratory systems: Secondary | ICD-10-CM | POA: Diagnosis not present

## 2018-05-18 NOTE — Progress Notes (Signed)
Referral sent to [facility]

## 2018-06-04 ENCOUNTER — Other Ambulatory Visit: Payer: Self-pay | Admitting: Internal Medicine

## 2018-06-04 DIAGNOSIS — I10 Essential (primary) hypertension: Secondary | ICD-10-CM

## 2018-06-05 ENCOUNTER — Ambulatory Visit: Payer: Medicare Other | Admitting: Vascular Surgery

## 2018-06-05 ENCOUNTER — Encounter: Payer: Self-pay | Admitting: Vascular Surgery

## 2018-06-05 VITALS — BP 149/74 | HR 64 | Resp 16 | Ht 66.0 in | Wt 147.9 lb

## 2018-06-05 DIAGNOSIS — I7409 Other arterial embolism and thrombosis of abdominal aorta: Secondary | ICD-10-CM | POA: Diagnosis not present

## 2018-06-05 NOTE — Progress Notes (Signed)
[facility] [place]  Patient name: Christopher Epp. MRN: [id number] DOB: May 19, 1933 Sex: male  REASON FOR VISIT: Discuss recent noninvasive studies revealing right iliac occlusive disease  HPI: Christopher Encarnacion. is a 85 y.o. male here today for discussion of aortoiliac occlusive disease.  I had seen him 3 years ago at which time he was evaluated for lower extremity claudication.  Ankle arm index at that time in 2017 was 0.53 on the right and 0.65 on the left.  He recently had a evaluation with aortic duplex due to abdominal bruit.  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  He is here for continued discussion.  He is a lifelong smoker and has no desire to stop this.  He does report hip and buttock claudication that limits him and he then rest and has resolution of this.  He is on Pletal and does not know whether he is getting any benefit from this.  He does remain active for his age of 13.  He gardens and [employer] and does walk  Past Medical History:  Diagnosis Date  . Anemia   . Atrial fibrillation ([place])   . Basal cell carcinoma of skin, site unspecified   . Benign neoplasm of colon   . Contact dermatitis and other eczema, due to unspecified cause   . Diverticulosis of colon (without mention of hemorrhage)   . Extrinsic asthma, unspecified   . Gout, unspecified   . Impotence of organic origin   . Other and unspecified hyperlipidemia   . Unspecified essential hypertension     Family History  Problem Relation Age of Onset  . Coronary artery disease Father 69       deceased  . Heart attack Father   . Stroke Mother   . Hypertension Mother   . Diabetes Neg Hx   . Cancer Neg Hx        colon, prostate    SOCIAL HISTORY: Social History   Tobacco Use  . Smoking status: Current Every Day Smoker    Packs/day: 1.00    Years: 60.00    Pack years: 60.00      Types: Cigarettes  . Smokeless tobacco: Never Used  . Tobacco comment: explained LDCT; smokes more since wife died;   Substance Use Topics  . Alcohol use: Yes    Alcohol/week: 10.0 standard drinks    Types: 10 Cans of beer per week    Comment: Rarely drink anything    Allergies  Allergen Reactions  . Statins     myopathy  . Codeine Nausea And Vomiting and Other (See Comments)    dizziness  . Neomycin-Bacitracin Zn-Polymyx Other (See Comments)    Neosporin caused rash when covered with bandaid  . Penicillins   . Prednisone Other (See Comments)    Lips and tongue swelled up    Current Outpatient Medications  Medication Sig Dispense Refill  . Cholecalciferol (VITAMIN D) 2000 UNITS CAPS Take 2,000 Units by mouth daily.    . cilostazol (PLETAL) 100 MG tablet TAKE 1 TABLET BY MOUTH TWICE DAILY BEFORE MEALS 180 tablet 0  . colchicine 0.6 MG tablet Take 1 tablet (0.6 mg total) by mouth daily. 30 tablet 5  . ezetimibe (ZETIA) 10 MG tablet TAKE 1 TABLET BY MOUTH ONCE DAILY 90 tablet 1  . hydrochlorothiazide (MICROZIDE) 12.5 MG capsule Take 1 capsule (12.5 mg total) by mouth  daily. 90 capsule 0  . lisinopril (PRINIVIL,ZESTRIL) 20 MG tablet TAKE 1 TABLET BY MOUTH ONCE DAILY 90 tablet 1  . silodosin (RAPAFLO) 4 MG CAPS capsule Take 1 capsule (4 mg total) by mouth daily with breakfast. 90 capsule 1  . tadalafil (CIALIS) 5 MG tablet TAKE 1 TABLET(5 MG) BY MOUTH DAILY AS NEEDED FOR ERECTILE DYSFUNCTION 90 tablet 1  . thiamine (VITAMIN B-1) 50 MG tablet Take 1 tablet (50 mg total) by mouth daily. 90 tablet 0   No current facility-administered medications for this visit.     REVIEW OF SYSTEMS:  [X]  denotes positive finding, [ ]  denotes negative finding Cardiac  Comments:  Chest pain or chest pressure:    Shortness of breath upon exertion:    Short of breath when lying flat:    Irregular heart rhythm:        Vascular    Pain in calf, thigh, or hip brought on by ambulation: x   Pain  in feet at night that wakes you up from your sleep:     Blood clot in your veins:    Leg swelling:           PHYSICAL EXAM: Vitals:   06/05/18 1026  BP: (!) 149/74  Pulse: 64  Resp: 16  SpO2: 94%  Weight: 147 lb 14.9 oz (67.1 kg)  Height: 5\' 6"  (1.676 m)    GENERAL: The patient is a well-nourished male, in no acute distress. The vital signs are documented above. CARDIOVASCULAR: Carotid arteries are without bruits bilaterally.  Easily palpable radial pulses bilaterally.  He has a 1+ left dorsalis pedis and absent right pedal pulses PULMONARY: There is good air exchange  MUSCULOSKELETAL: There are no major deformities or cyanosis. NEUROLOGIC: No focal weakness or paresthesias are detected. SKIN: There are no ulcers or rashes noted. PSYCHIATRIC: The patient has a normal affect.  DATA:  Noninvasive studies suggested stenosis at the distal aorta and right common iliac artery  MEDICAL ISSUES: I had a long discussion with the patient and his daughter regarding options.  I explained that this is certainly not limb threatening.  He has had no tissue loss.  I did explain that he may have treatment available with endovascular means as an outpatient.  I explained the option of CT angiogram for evaluation versus lower extremity arteriography.  He does have mild renal insufficiency with a creatinine of 1.6.  He has a very severe fear of needles and is hesitant to proceed.  I have asked him to consider this.  Certainly if he is not having any significant intolerable limitation in his walking I would recommend continued observation only.  He will notify us if this is not tolerable and we will proceed with further evaluation.  He is asking about the Pletal and I have stated to him that in my opinion he can discontinue this and if he subjectively feels that things are worse he can resume the Pletal for his intermittent claudication    Rosetta Posner, MD FACS [facility] Tel [phone] Pager [phone]

## 2018-06-25 ENCOUNTER — Other Ambulatory Visit: Payer: Self-pay | Admitting: Internal Medicine

## 2018-06-25 DIAGNOSIS — E538 Deficiency of other specified B group vitamins: Secondary | ICD-10-CM

## 2018-06-25 DIAGNOSIS — F04 Amnestic disorder due to known physiological condition: Principal | ICD-10-CM

## 2018-06-26 ENCOUNTER — Other Ambulatory Visit: Payer: Self-pay | Admitting: Internal Medicine

## 2018-06-26 DIAGNOSIS — I10 Essential (primary) hypertension: Secondary | ICD-10-CM

## 2018-06-26 MED ORDER — HYDROCHLOROTHIAZIDE 12.5 MG PO CAPS: 12.5000 mg | ORAL_CAPSULE | Freq: Every day | ORAL | 1 refills | Status: DC

## 2018-09-11 ENCOUNTER — Other Ambulatory Visit (INDEPENDENT_AMBULATORY_CARE_PROVIDER_SITE_OTHER): Payer: Medicare Other

## 2018-09-11 ENCOUNTER — Encounter: Payer: Self-pay | Admitting: Internal Medicine

## 2018-09-11 ENCOUNTER — Ambulatory Visit (INDEPENDENT_AMBULATORY_CARE_PROVIDER_SITE_OTHER): Payer: Medicare Other | Admitting: Internal Medicine

## 2018-09-11 ENCOUNTER — Other Ambulatory Visit: Payer: Self-pay

## 2018-09-11 VITALS — BP 140/70 | HR 67 | Temp 98.2°F | Ht 66.0 in | Wt 144.0 lb

## 2018-09-11 DIAGNOSIS — N183 Chronic kidney disease, stage 3 unspecified: Secondary | ICD-10-CM

## 2018-09-11 DIAGNOSIS — E785 Hyperlipidemia, unspecified: Secondary | ICD-10-CM

## 2018-09-11 DIAGNOSIS — Z0000 Encounter for general adult medical examination without abnormal findings: Secondary | ICD-10-CM | POA: Diagnosis not present

## 2018-09-11 DIAGNOSIS — D539 Nutritional anemia, unspecified: Secondary | ICD-10-CM

## 2018-09-11 DIAGNOSIS — I739 Peripheral vascular disease, unspecified: Secondary | ICD-10-CM

## 2018-09-11 LAB — BASIC METABOLIC PANEL
BUN: 39 mg/dL — ABNORMAL HIGH (ref 6–23)
CO2: 27 mEq/L (ref 19–32)
Calcium: 9.3 mg/dL (ref 8.4–10.5)
Chloride: 103 mEq/L (ref 96–112)
Creatinine, Ser: 1.73 mg/dL — ABNORMAL HIGH (ref 0.40–1.50)
GFR: 37.95 mL/min — ABNORMAL LOW (ref >60.00)
Glucose, Bld: 89 mg/dL (ref 70–99)
Potassium: 5 mEq/L (ref 3.5–5.1)
Sodium: 137 mEq/L (ref 135–145)

## 2018-09-11 LAB — IBC PANEL
Iron: 51 ug/dL (ref 42–165)
Saturation Ratios: 15.5 % — ABNORMAL LOW (ref 20.0–50.0)
Transferrin: 235 mg/dL (ref 212.0–360.0)

## 2018-09-11 LAB — LIPID PANEL
Cholesterol: 180 mg/dL (ref 0–200)
HDL: 33.8 mg/dL — ABNORMAL LOW (ref >39.00)
NonHDL: 146.44
Total CHOL/HDL Ratio: 5
Triglycerides: 265 mg/dL — ABNORMAL HIGH (ref 0.0–149.0)
VLDL: 53 mg/dL — ABNORMAL HIGH (ref 0.0–40.0)

## 2018-09-11 LAB — FOLATE: Folate: 11.8 ng/mL (ref >5.9)

## 2018-09-11 LAB — LDL CHOLESTEROL, DIRECT: Direct LDL: 101 mg/dL

## 2018-09-11 LAB — RETICULOCYTES
ABS Retic: 30100 cells/uL (ref 25000–9000)
Retic Ct Pct: 0.7 %

## 2018-09-11 LAB — FERRITIN: Ferritin: 115.3 ng/mL (ref 22.0–322.0)

## 2018-09-11 LAB — VITAMIN B12: Vitamin B-12: 345 pg/mL (ref 211–911)

## 2018-09-11 LAB — CBC WITH DIFFERENTIAL/PLATELET
Basophils Absolute: 0.1 10*3/uL (ref 0.0–0.1)
Basophils Relative: 1.3 % (ref 0.0–3.0)
Eosinophils Absolute: 0.3 10*3/uL (ref 0.0–0.7)
Eosinophils Relative: 2.7 % (ref 0.0–5.0)
HCT: 39.2 % (ref 39.0–52.0)
Hemoglobin: 13.7 g/dL (ref 13.0–17.0)
Lymphocytes Relative: 21.2 % (ref 12.0–46.0)
Lymphs Abs: 2.2 10*3/uL (ref 0.7–4.0)
MCHC: 34.9 g/dL (ref 30.0–36.0)
MCV: 89.3 fl (ref 78.0–100.0)
Monocytes Absolute: 1.1 10*3/uL — ABNORMAL HIGH (ref 0.1–1.0)
Monocytes Relative: 10.6 % (ref 3.0–12.0)
Neutro Abs: 6.6 10*3/uL (ref 1.4–7.7)
Neutrophils Relative %: 64.2 % (ref 43.0–77.0)
Platelets: 221 10*3/uL (ref 150.0–400.0)
RBC: 4.39 Mil/uL (ref 4.22–5.81)
RDW: 14.7 % (ref 11.5–15.5)
WBC: 10.3 10*3/uL (ref 4.0–10.5)

## 2018-09-11 MED ORDER — ASPIRIN EC 81 MG PO TBEC: 81.0000 mg | DELAYED_RELEASE_TABLET | Freq: Every day | ORAL | 1 refills | Status: DC

## 2018-09-11 MED ORDER — RIVAROXABAN 2.5 MG PO TABS: 2.5000 mg | ORAL_TABLET | Freq: Two times a day (BID) | ORAL | 1 refills | Status: DC

## 2018-09-11 NOTE — Patient Instructions (Signed)
Anemia  Anemia is a condition in which you do not have enough red blood cells or hemoglobin. Hemoglobin is a substance in red blood cells that carries oxygen. When you do not have enough red blood cells or hemoglobin (are anemic), your body cannot get enough oxygen and your organs may not work properly. As a result, you may feel very tired or have other problems. What are the causes? Common causes of anemia include:  Excessive bleeding. Anemia can be caused by excessive bleeding inside or outside the body, including bleeding from the intestine or from periods in women.  Poor nutrition.  Long-lasting (chronic) kidney, thyroid, and liver disease.  Bone marrow disorders.  Cancer and treatments for cancer.  HIV (human immunodeficiency virus) and AIDS (acquired immunodeficiency syndrome).  Treatments for HIV and AIDS.  Spleen problems.  Blood disorders.  Infections, medicines, and autoimmune disorders that destroy red blood cells. What are the signs or symptoms? Symptoms of this condition include:  Minor weakness.  Dizziness.  Headache.  Feeling heartbeats that are irregular or faster than normal (palpitations).  Shortness of breath, especially with exercise.  Paleness.  Cold sensitivity.  Indigestion.  Nausea.  Difficulty sleeping.  Difficulty concentrating. Symptoms may occur suddenly or develop slowly. If your anemia is mild, you may not have symptoms. How is this diagnosed? This condition is diagnosed based on:  Blood tests.  Your medical history.  A physical exam.  Bone marrow biopsy. Your health care provider may also check your stool (feces) for blood and may do additional testing to look for the cause of your bleeding. You may also have other tests, including:  Imaging tests, such as a CT scan or MRI.  Endoscopy.  Colonoscopy. How is this treated? Treatment for this condition depends on the cause. If you continue to lose a lot of blood, you may  need to be treated at a hospital. Treatment may include:  Taking supplements of iron, vitamin S31, or folic acid.  Taking a hormone medicine (erythropoietin) that can help to stimulate red blood cell growth.  Having a blood transfusion. This may be needed if you lose a lot of blood.  Making changes to your diet.  Having surgery to remove your spleen. Follow these instructions at home:  Take over-the-counter and prescription medicines only as told by your health care provider.  Take supplements only as told by your health care provider.  Follow any diet instructions that you were given.  Keep all follow-up visits as told by your health care provider. This is important. Contact a health care provider if:  You develop new bleeding anywhere in the body. Get help right away if:  You are very weak.  You are short of breath.  You have pain in your abdomen or chest.  You are dizzy or feel faint.  You have trouble concentrating.  You have bloody or black, tarry stools.  You vomit repeatedly or you vomit up blood. Summary  Anemia is a condition in which you do not have enough red blood cells or enough of a substance in your red blood cells that carries oxygen (hemoglobin).  Symptoms may occur suddenly or develop slowly.  If your anemia is mild, you may not have symptoms.  This condition is diagnosed with blood tests as well as a medical history and physical exam. Other tests may be needed.  Treatment for this condition depends on the cause of the anemia. This information is not intended to replace advice given to you by  your health care provider. Make sure you discuss any questions you have with your health care provider. Document Released: 05/05/2004 Document Revised: 04/29/2016 Document Reviewed: 04/29/2016 Elsevier Interactive Patient Education  2019 [employer].

## 2018-09-11 NOTE — Progress Notes (Signed)
Subjective:  Patient ID: [name], male    DOB: 07-05-1933  Age: 85 y.o. MRN: [id number]  CC: Anemia and Hyperlipidemia   HPI Nehemiah Mcfarren. presents for f/up - He says he gave himself a severe haircut a few weeks ago and is now concerned that he has developed gray hair and alopecia on both temporal surfaces.  The skin on his scalp and face does not bother him.  He plays golf and cuts his own grass with a push lawnmower.  He does not experience any chest pain, shortness of breath, or dyspnea on exertion.  He does complain over the last few months of worsening claudication with pain in his buttocks, hips, thighs, and calves.  He has to rest.  He saw his vascular surgeon about 5 months ago and has been taking cilostazol but he does not think it is helping.  Outpatient Medications Prior to Visit  Medication Sig Dispense Refill  . Cholecalciferol (VITAMIN D) 2000 UNITS CAPS Take 2,000 Units by mouth daily.    . colchicine 0.6 MG tablet Take 1 tablet (0.6 mg total) by mouth daily. 30 tablet 5  . ezetimibe (ZETIA) 10 MG tablet TAKE 1 TABLET BY MOUTH ONCE DAILY 90 tablet 1  . hydrochlorothiazide (MICROZIDE) 12.5 MG capsule Take 1 capsule (12.5 mg total) by mouth daily. 90 capsule 1  . lisinopril (PRINIVIL,ZESTRIL) 20 MG tablet TAKE 1 TABLET BY MOUTH ONCE DAILY 90 tablet 1  . silodosin (RAPAFLO) 4 MG CAPS capsule Take 1 capsule (4 mg total) by mouth daily with breakfast. 90 capsule 1  . tadalafil (CIALIS) 5 MG tablet TAKE 1 TABLET(5 MG) BY MOUTH DAILY AS NEEDED FOR ERECTILE DYSFUNCTION 90 tablet 1  . thiamine (VITAMIN B-1) 100 MG tablet TAKE 1 TABLET(100 MG) BY MOUTH DAILY 100 tablet 0  . thiamine (VITAMIN B-1) 50 MG tablet Take 1 tablet (50 mg total) by mouth daily. 90 tablet 0  . cilostazol (PLETAL) 100 MG tablet TAKE 1 TABLET BY MOUTH TWICE DAILY BEFORE MEALS 180 tablet 0   No facility-administered medications prior to visit.     ROS Review of Systems  Constitutional:  Negative.  Negative for diaphoresis and fatigue.  HENT: Negative.   Eyes: Negative.  Negative for visual disturbance.  Respiratory: Negative for cough, chest tightness, shortness of breath and wheezing.   Cardiovascular: Negative for chest pain, palpitations and leg swelling.  Gastrointestinal: Negative for abdominal pain, constipation, diarrhea, nausea and vomiting.  Genitourinary: Negative.  Negative for difficulty urinating, dysuria, hematuria and urgency.  Musculoskeletal: Negative for arthralgias.  Skin: Negative.   Neurological: Negative.  Negative for dizziness, weakness, light-headedness and headaches.  Hematological: Negative for adenopathy. Does not bruise/bleed easily.  Psychiatric/Behavioral: Negative.     Objective:  BP 140/70 (BP Location: Left Arm, Patient Position: Sitting, Cuff Size: Normal)   Pulse 67   Temp 98.2 F (36.8 C) (Oral)   Ht 5\' 6"  (1.676 m)   Wt 144 lb (65.3 kg)   SpO2 97%   BMI 23.24 kg/m   BP Readings from Last 3 Encounters:  09/11/18 140/70  06/05/18 (!) 149/74  05/08/18 (!) 152/78    Wt Readings from Last 3 Encounters:  09/11/18 144 lb (65.3 kg)  06/05/18 147 lb 14.9 oz (67.1 kg)  05/08/18 152 lb (68.9 kg)    Physical Exam Vitals signs reviewed.  Constitutional:      Appearance: He is not ill-appearing or diaphoretic.  HENT:     Head:  Comments: On both temples there is mild alopecia and the hair is gray.  I have asked him to treat this with Rogaine.    Nose: Nose normal.     Mouth/Throat:     Mouth: Mucous membranes are moist.     Pharynx: Oropharynx is clear.  Eyes:     General: No scleral icterus.    Conjunctiva/sclera: Conjunctivae normal.  Neck:     Musculoskeletal: Normal range of motion. No neck rigidity or muscular tenderness.  Cardiovascular:     Rate and Rhythm: Normal rate and regular rhythm.     Pulses:          Carotid pulses are 1+ on the right side and 1+ on the left side.      Radial pulses are 2+ on the  right side and 2+ on the left side.       Femoral pulses are 0 on the right side and 0 on the left side.      Popliteal pulses are 0 on the right side and 0 on the left side.       Dorsalis pedis pulses are 0 on the right side and 0 on the left side.       Posterior tibial pulses are 0 on the right side and 0 on the left side.     Heart sounds: Normal heart sounds and S1 normal. No murmur. No gallop. No S3 or S4 sounds.   Pulmonary:     Effort: No respiratory distress.     Breath sounds: No stridor. No wheezing, rhonchi or rales.  Abdominal:     General: Abdomen is flat.     Palpations: There is no hepatomegaly or splenomegaly.     Tenderness: There is no abdominal tenderness.  Genitourinary:    Comments: GU and rectal exams were deferred at his request. Musculoskeletal: Normal range of motion.     Right lower leg: No edema.     Left lower leg: No edema.  Lymphadenopathy:     Cervical: No cervical adenopathy.  Skin:    General: Skin is warm and dry.  Neurological:     General: No focal deficit present.  Psychiatric:        Mood and Affect: Mood normal.     Lab Results  Component Value Date   WBC 10.3 09/11/2018   HGB 13.7 09/11/2018   HCT 39.2 09/11/2018   PLT 221.0 09/11/2018   GLUCOSE 89 09/11/2018   CHOL 180 09/11/2018   TRIG 265.0 (H) 09/11/2018   HDL 33.80 (L) 09/11/2018   LDLDIRECT 101.0 09/11/2018   LDLCALC 111 (H) 06/13/2017   ALT 16 06/13/2017   AST 13 06/13/2017   NA 137 09/11/2018   K 5.0 09/11/2018   CL 103 09/11/2018   CREATININE 1.73 (H) 09/11/2018   BUN 39 (H) 09/11/2018   CO2 27 09/11/2018   TSH 2.62 06/06/2016   PSA 2.40 12/11/2014    Vas [place] Aaa Duplex  Result Date: 05/17/2018 ABDOMINAL AORTA STUDY Indications: abdominal bruit Risk Factors: Hypertension, hyperlipidemia, current smoker. Limitations: Air/bowel gas.  Performing Technologist: Burley Saver RVT  Examination Guidelines: A complete evaluation includes B-mode imaging, spectral Doppler,  color Doppler, and power Doppler as needed of all accessible portions of each vessel. Bilateral testing is considered an integral part of a complete examination. Limited examinations for reoccurring indications may be performed as noted.  Abdominal Aorta Findings: +-----------+-------+----------+----------+--------+--------+--------+ Location   AP (cm)Trans (cm)PSV (cm/s)WaveformThrombusComments +-----------+-------+----------+----------+--------+--------+--------+ Proximal   1.76  1.42      98        biphasic                 +-----------+-------+----------+----------+--------+--------+--------+ Mid        1.66   1.52      101       biphasic                 +-----------+-------+----------+----------+--------+--------+--------+ Distal     0.84   0.90      225       biphasic        plaque   +-----------+-------+----------+----------+--------+--------+--------+ RT CIA Prox1.1    1.0       236       biphasic        plaque   +-----------+-------+----------+----------+--------+--------+--------+ LT CIA Prox1.0    1.0       130       biphasic                 +-----------+-------+----------+----------+--------+--------+--------+  Visualization of the Distal Abdominal Aorta, Right CIA Proximal artery and Left CIA Proximal artery was limited.  Summary: Abdominal Aorta: No evidence of an abdominal aortic aneurysm was visualized. The largest aortic measurement is 1.8 cm. Plaque noted in the distal abdominal aorta, with velocities of 225 cm/s, which are suggestive of a >50% stenosis. Plaque and elevated velocities in the right common iliac artery are suggestive of a >50% stenosis. No previous exam available for comparison.  *See table(s) above for measurements and observations.  Electronically signed by Ruta Hinds MD on 05/17/2018 at 1:42:42 PM.   Final     Assessment & Plan:   Alroy was seen today for anemia and hyperlipidemia.  Diagnoses and all orders for this visit:   Kidney disease, chronic, stage III (GFR 30-59 ml/min) ([place])- His renal function is stable.  He agrees to avoid nephrotoxic agents. -     Basic metabolic panel; Future  Hyperlipidemia with target LDL less than 100-  He is not willing to take a statin. -     Lipid panel; Future  Deficiency anemia- His H&H and vitamin levels are normal now. -     CBC with Differential/Platelet; Future -     IBC panel; Future -     Ferritin; Future -     Folate; Future -     Vitamin B12; Future -     Reticulocytes; Future  PAD (peripheral artery disease) ([place])- I have asked him to upgrade to a baby aspirin and a microdose of Xarelto to reduce the risk of limb threatening complications and lower limb amputations.  He will stop taking cilostazol. -     rivaroxaban (XARELTO) 2.5 MG TABS tablet; Take 1 tablet (2.5 mg total) by mouth 2 (two) times daily. -     aspirin EC 81 MG tablet; Take 1 tablet (81 mg total) by mouth daily.   I have discontinued Demetrion L. Ellenberger Jr.'s cilostazol. I am also having him start on rivaroxaban and aspirin EC. Additionally, I am having him maintain his Vitamin D, tadalafil, colchicine, ezetimibe, silodosin, thiamine, lisinopril, thiamine, and hydrochlorothiazide.  Meds ordered this encounter  Medications  . rivaroxaban (XARELTO) 2.5 MG TABS tablet    Sig: Take 1 tablet (2.5 mg total) by mouth 2 (two) times daily.    Dispense:  180 tablet    Refill:  1  . aspirin EC 81 MG tablet    Sig: Take 1 tablet (81 mg total) by  mouth daily.    Dispense:  90 tablet    Refill:  1     Follow-up: Return in about 3 months (around 12/12/2018).  Scarlette Calico, MD

## 2018-09-12 ENCOUNTER — Encounter: Payer: Self-pay | Admitting: Internal Medicine

## 2018-09-17 ENCOUNTER — Encounter: Payer: Self-pay | Admitting: Internal Medicine

## 2018-09-18 ENCOUNTER — Other Ambulatory Visit: Payer: Self-pay | Admitting: Internal Medicine

## 2018-09-18 DIAGNOSIS — E781 Pure hyperglyceridemia: Secondary | ICD-10-CM | POA: Insufficient documentation

## 2018-09-18 MED ORDER — ICOSAPENT ETHYL 1 G PO CAPS: 2.0000 | ORAL_CAPSULE | Freq: Two times a day (BID) | ORAL | 1 refills | Status: DC

## 2018-09-27 ENCOUNTER — Encounter: Payer: Self-pay | Admitting: Internal Medicine

## 2018-10-23 ENCOUNTER — Other Ambulatory Visit: Payer: Self-pay | Admitting: Internal Medicine

## 2018-10-23 DIAGNOSIS — I739 Peripheral vascular disease, unspecified: Secondary | ICD-10-CM

## 2018-10-23 DIAGNOSIS — E785 Hyperlipidemia, unspecified: Secondary | ICD-10-CM

## 2018-10-23 MED ORDER — EZETIMIBE 10 MG PO TABS: 10.0000 mg | ORAL_TABLET | Freq: Every day | ORAL | 1 refills | Status: DC

## 2018-11-13 ENCOUNTER — Other Ambulatory Visit: Payer: Self-pay | Admitting: Internal Medicine

## 2018-11-13 DIAGNOSIS — F04 Amnestic disorder due to known physiological condition: Secondary | ICD-10-CM

## 2018-11-13 DIAGNOSIS — E538 Deficiency of other specified B group vitamins: Secondary | ICD-10-CM

## 2018-11-13 MED ORDER — VITAMIN B-1 50 MG PO TABS: 50.0000 mg | ORAL_TABLET | Freq: Every day | ORAL | 0 refills | Status: DC

## 2019-01-14 ENCOUNTER — Other Ambulatory Visit: Payer: Self-pay | Admitting: *Deleted

## 2019-01-29 ENCOUNTER — Other Ambulatory Visit: Payer: Self-pay | Admitting: Emergency Medicine

## 2019-01-29 DIAGNOSIS — I10 Essential (primary) hypertension: Secondary | ICD-10-CM

## 2019-01-29 MED ORDER — HYDROCHLOROTHIAZIDE 12.5 MG PO CAPS: 12.5000 mg | ORAL_CAPSULE | Freq: Every day | ORAL | 0 refills | Status: DC

## 2019-01-31 ENCOUNTER — Ambulatory Visit: Payer: Medicare Other

## 2019-02-04 NOTE — Progress Notes (Addendum)
Subjective:   Christopher Rubio. is a 85 y.o. male who presents for Medicare Annual/Subsequent preventive examination.  Review of Systems:   Cardiac Risk Factors include: advanced age (>40men, >85 women);male gender;dyslipidemia Sleep patterns: gets up 1-2 times nightly to void and sleeps 7-8 hours nightly.    Home Safety/Smoke Alarms: Feels safe in home. Smoke alarms in place.  Living environment; residence and Firearm Safety: 1-story house/ trailer. Lives alone, no needs for DME, limited support system  Seat Belt Safety/Bike Helmet: Wears seat belt.     Objective:    Vitals: BP 138/82   Pulse 99   Resp 17   Ht 5\' 6"  (1.676 m)   Wt 140 lb (63.5 kg)   SpO2 99%   BMI 22.60 kg/m   Body mass index is 22.6 kg/m.  Advanced Directives 02/05/2019 01/25/2018 01/19/2017 11/18/2015 02/17/2015 12/03/2014  Does Patient Have a Medical Advance Directive? Yes Yes Yes Yes Yes Yes  Type of Paramedic of [place];Living will [place];Living will [place];Living will - [place];Living will -  Copy of [place] in Chart? No - copy requested No - copy requested No - copy requested - No - copy requested Yes  Would patient like information on creating a medical advance directive? - - - Yes - Educational materials given - -    Tobacco Social History   Tobacco Use  Smoking Status Current Every Day Smoker  . Packs/day: 0.50  . Years: 60.00  . Pack years: 30.00  . Types: Cigarettes  Smokeless Tobacco Never Used  Tobacco Comment   explained LDCT; smokes more since wife died;      Ready to quit: No Counseling given: No Comment: explained LDCT; smokes more since wife died;   Past Medical History:  Diagnosis Date  . Anemia   . Atrial fibrillation ([place])   . Basal cell carcinoma of skin, site unspecified   . Benign neoplasm of colon   . Contact dermatitis and other eczema, due to  unspecified cause   . Diverticulosis of colon (without mention of hemorrhage)   . Extrinsic asthma, unspecified   . Gout, unspecified   . Impotence of organic origin   . Other and unspecified hyperlipidemia   . Unspecified essential hypertension    Past Surgical History:  Procedure Laterality Date  . TONSILLECTOMY     Family History  Problem Relation Age of [place]set  . Coronary artery disease Father 61       deceased  . Heart attack Father   . Stroke Mother   . Hypertension Mother   . Diabetes Neg Hx   . Cancer Neg Hx        colon, prostate   Social History   Socioeconomic History  . Marital status: Widowed    Spouse name: Not on file  . Number of children: 2  . Years of education: Not on file  . Highest education level: Not on file  Occupational History  . Occupation: retired Tour manager  . Financial resource strain: Not hard at all  . Food insecurity    Worry: Never true    Inability: Never true  . Transportation needs    Medical: No    Non-medical: No  Tobacco Use  . Smoking status: Current Every Day Smoker    Packs/day: 0.50    Years: 60.00    Pack years: 30.00    Types: Cigarettes  .  Smokeless tobacco: Never Used  . Tobacco comment: explained LDCT; smokes more since wife died;   Substance and Sexual Activity  . Alcohol use: Yes    Alcohol/week: 10.0 standard drinks    Types: 10 Cans of beer per week    Comment: Rarely drink anything  . Drug use: No  . Sexual activity: Not Currently  Lifestyle  . Physical activity    Days per week: 3 days    Minutes per session: 40 min  . Stress: Not at all  Relationships  . Social connections    Talks on phone: More than three times a week    Gets together: More than three times a week    Attends religious service: 1 to 4 times per year    Active member of club or organization: Yes    Attends meetings of clubs or organizations: More than 4 times per year    Relationship status: Widowed  Other Topics  Concern  . Not on file  Social History Narrative   HS[employer], [place], [place]; [employer]. Married '62. 2 daughters - '67, '70: no grandchildren. Retired-college Music therapist. Enjoys retirement: golf, gardening, woodworking. Marriage in good health             Outpatient Encounter Medications as of 02/05/2019  Medication Sig  . aspirin EC 81 MG tablet Take 1 tablet (81 mg total) by mouth daily.  . Cholecalciferol (VITAMIN D) 2000 UNITS CAPS Take 2,000 Units by mouth daily.  . colchicine 0.6 MG tablet Take 1 tablet (0.6 mg total) by mouth daily. (Patient taking differently: Take 0.6 mg by mouth as needed. )  . ezetimibe (ZETIA) 10 MG tablet Take 1 tablet (10 mg total) by mouth daily.  . hydrochlorothiazide (MICROZIDE) 12.5 MG capsule Take 1 capsule (12.5 mg total) by mouth daily. -- Office visit needed for further refills  . lisinopril (PRINIVIL,ZESTRIL) 20 MG tablet TAKE 1 TABLET BY MOUTH ONCE DAILY  . rivaroxaban (XARELTO) 2.5 MG TABS tablet Take 1 tablet (2.5 mg total) by mouth 2 (two) times daily.  . silodosin (RAPAFLO) 4 MG CAPS capsule Take 1 capsule (4 mg total) by mouth daily with breakfast.  . tadalafil (CIALIS) 5 MG tablet TAKE 1 TABLET(5 MG) BY MOUTH DAILY AS NEEDED FOR ERECTILE DYSFUNCTION  . thiamine (VITAMIN B-1) 50 MG tablet Take 1 tablet (50 mg total) by mouth daily.  . [DISCONTINUED] Icosapent Ethyl (VASCEPA) 1 g CAPS Take 2 capsules (2 g total) by mouth 2 (two) times daily. (Patient not taking: Reported on 02/05/2019)   No facility-administered encounter medications on file as of 02/05/2019.     Activities of Daily Living In your present state of health, do you have any difficulty performing the following activities: 02/05/2019  Hearing? N  Vision? N  Difficulty concentrating or making decisions? N  Walking or climbing stairs? N  Dressing or bathing? N  Doing errands, shopping? N  Preparing Food and eating ? N  Using the Toilet? N  In the  past six months, have you accidently leaked urine? N  Do you have problems with loss of bowel control? N  Managing your Medications? N  Managing your Finances? N  Housekeeping or managing your Housekeeping? N  Some recent data might be hidden    Patient Care Team: [name], MD as PCP - General (Internal Medicine) Early, Arvilla Meres, MD as Consulting Physician (Vascular Surgery)   Assessment:   This is a routine wellness examination for [place]. Physical  assessment deferred to PCP.   Exercise Activities and Dietary recommendations Current Exercise Habits: Home exercise routine, Type of exercise: walking, Time (Minutes): 35, Frequency (Times/Week): 4, Weekly Exercise (Minutes/Week): 140, Intensity: Mild, Exercise limited by: None identified  Diet (meal preparation, eat out, water intake, caffeinated beverages, dairy products, fruits and vegetables): in general, a "healthy" diet     Reviewed heart healthy diet. Encouraged patient to increase daily water and healthy fluid intake.   Discussed supplementing with Ensure, samples and coupons provided.  Goals    . Exercise 150 minutes per week (moderate activity)     Will start weight program at home Loss of muscle tone; recommended exercise with the 5 lb weights he has or can get exercise rubber bands or stretch bands      . patient     Will add some calories to diet in the am Peanut butter; banana; cereal; blueberries  Gain weight Add honey; Eat nuts; (mixed) almonds or walnuts  Ice cream;  Protein bar       . Patient Stated    . Patient Stated      Stay active physically and socially. Continue to eat healthy and exercise.    . Stay as healthy and as independent as possible     Continue to exercise, eat healthy, and take care of me the best I can.     . will continue yoga     Will continue to embrace yoga and experiment with other forms of relaxation       Fall Risk Fall Risk  02/05/2019 05/08/2018 01/25/2018  01/19/2017 11/18/2015  Falls in the past year? 0 0 No No No  Number falls in past yr: 0 0 - - -  Injury with Fall? 0 0 - - -  Risk for fall due to : - Impaired mobility - - -  Follow up - Falls evaluation completed - - -   Is the patient's home free of loose throw rugs in walkways, pet beds, electrical cords, etc?   yes      Grab bars in the bathroom? yes      Handrails on the stairs?   yes      Adequate lighting?   yes   Depression Screen PHQ 2/9 Scores 02/05/2019 01/25/2018 01/19/2017 11/18/2015  PHQ - 2 Score 0 0 1 0  PHQ- 9 Score - - 2 -    Cognitive Function MMSE - Mini Mental State Exam 01/25/2018 01/19/2017 11/18/2015  Not completed: - - (No Data)  Orientation to time 5 5 -  Orientation to Place 5 5 -  Registration 3 3 -  Attention/ Calculation 5 5 -  Recall 2 2 -  Language- name 2 objects 2 2 -  Language- repeat 1 1 -  Language- follow 3 step command 3 3 -  Language- read & follow direction 1 1 -  Write a sentence 1 1 -  Copy design 1 1 -  Total score 29 29 -     6CIT Screen 02/05/2019  What Year? 0 points  What month? 0 points  What time? 0 points  Count back from 20 0 points  Months in reverse 0 points  Repeat phrase 0 points  Total Score 0    Immunization History  Administered Date(s) Administered  . Influenza, High Dose Seasonal PF 12/11/2014, 01/26/2016, 01/19/2017, 01/25/2018  . Influenza,inj,Quad PF,6+ Mos 02/25/2013, 12/30/2013  . Pneumococcal Conjugate-13 03/13/2014  . Pneumococcal Polysaccharide-23 12/26/2012  . Td 08/10/2008  .  Zoster 02/13/2010    Screening Tests Health Maintenance  Topic Date Due  . TETANUS/TDAP  08/11/2018  . INFLUENZA VACCINE  11/10/2018  . PNA vac Low Risk Adult  Completed        Plan:    Reviewed health maintenance screenings with patient today and relevant education, vaccines, and/or referrals were provided.   I have personally reviewed and noted the following in the patient's chart:   . Medical and social  history . Use of alcohol, tobacco or illicit drugs  . Current medications and supplements . Functional ability and status . Nutritional status . Physical activity . Advanced directives . List of other physicians . Vitals . Screenings to include cognitive, depression, and falls . Referrals and appointments  In addition, I have reviewed and discussed with patient certain preventive protocols, quality metrics, and best practice recommendations. A written personalized care plan for preventive services as well as general preventive health recommendations were provided to patient.     Michiel Cowboy, RN  02/05/2019  Medical screening examination/treatment/procedure(s) were performed by non-physician practitioner and as supervising physician I was immediately available for consultation/collaboration. I agree with above. Scarlette Calico, MD

## 2019-02-05 ENCOUNTER — Other Ambulatory Visit: Payer: Self-pay

## 2019-02-05 ENCOUNTER — Ambulatory Visit (INDEPENDENT_AMBULATORY_CARE_PROVIDER_SITE_OTHER): Payer: Medicare Other | Admitting: *Deleted

## 2019-02-05 VITALS — BP 138/82 | HR 99 | Resp 17 | Ht 66.0 in | Wt 140.0 lb

## 2019-02-05 DIAGNOSIS — Z0000 Encounter for general adult medical examination without abnormal findings: Secondary | ICD-10-CM

## 2019-02-05 DIAGNOSIS — Z23 Encounter for immunization: Secondary | ICD-10-CM

## 2019-02-05 NOTE — Patient Instructions (Addendum)
Continue doing brain stimulating activities (puzzles, reading, adult coloring books, staying active) to keep memory sharp.   Continue to eat heart healthy diet (full of fruits, vegetables, whole grains, lean protein, water--limit salt, fat, and sugar intake) and increase physical activity as tolerated.   Christopher Rubio , Thank you for taking time to come for your Medicare Wellness Visit. I appreciate your ongoing commitment to your health goals. Please review the following plan we discussed and let me know if I can assist you in the future.   These are the goals we discussed: Goals    . Exercise 150 minutes per week (moderate activity)     Will start weight program at home Loss of muscle tone; recommended exercise with the 5 lb weights he has or can get exercise rubber bands or stretch bands      . patient     Will add some calories to diet in the am Peanut butter; banana; cereal; blueberries  Gain weight Add honey; Eat nuts; (mixed) almonds or walnuts  Ice cream;  Protein bar       . Patient Stated    . Patient Stated      Stay active physically and socially. Continue to eat healthy and exercise.    . Stay as healthy and as independent as possible     Continue to exercise, eat healthy, and take care of me the best I can.     . will continue yoga     Will continue to embrace yoga and experiment with other forms of relaxation       This is a list of the screening recommended for you and due dates:  Health Maintenance  Topic Date Due  . Tetanus Vaccine  08/11/2018  . Flu Shot  11/10/2018  . Pneumonia vaccines  Completed    Preventive Care 51 Years and Older, Male Preventive care refers to lifestyle choices and visits with your health care provider that can promote health and wellness. This includes:  A yearly physical exam. This is also called an annual well check.  Regular dental and eye exams.  Immunizations.  Screening for certain conditions.  Healthy  lifestyle choices, such as diet and exercise. What can I expect for my preventive care visit? Physical exam Your health care provider will check:  Height and weight. These may be used to calculate body mass index (BMI), which is a measurement that tells if you are at a healthy weight.  Heart rate and blood pressure.  Your skin for abnormal spots. Counseling Your health care provider may ask you questions about:  Alcohol, tobacco, and drug use.  Emotional well-being.  Home and relationship well-being.  Sexual activity.  Eating habits.  History of falls.  Memory and ability to understand (cognition).  Work and work Statistician. What immunizations do I need?  Influenza (flu) vaccine  This is recommended every year. Tetanus, diphtheria, and pertussis (Tdap) vaccine  You may need a Td booster every 10 years. Varicella (chickenpox) vaccine  You may need this vaccine if you have not already been vaccinated. Zoster (shingles) vaccine  You may need this after age 22. Pneumococcal conjugate (PCV13) vaccine  One dose is recommended after age 107. Pneumococcal polysaccharide (PPSV23) vaccine  One dose is recommended after age 63. Measles, mumps, and rubella (MMR) vaccine  You may need at least one dose of MMR if you were born in 1957 or later. You may also need a second dose. Meningococcal conjugate (MenACWY) vaccine  You  may need this if you have certain conditions. Hepatitis A vaccine  You may need this if you have certain conditions or if you travel or work in places where you may be exposed to hepatitis A. Hepatitis B vaccine  You may need this if you have certain conditions or if you travel or work in places where you may be exposed to hepatitis B. Haemophilus influenzae type b (Hib) vaccine  You may need this if you have certain conditions. You may receive vaccines as individual doses or as more than one vaccine together in one shot (combination vaccines). Talk  with your health care provider about the risks and benefits of combination vaccines. What tests do I need? Blood tests  Lipid and cholesterol levels. These may be checked every 5 years, or more frequently depending on your overall health.  Hepatitis C test.  Hepatitis B test. Screening  Lung cancer screening. You may have this screening every year starting at age 24 if you have a 30-pack-year history of smoking and currently smoke or have quit within the past 15 years.  Colorectal cancer screening. All adults should have this screening starting at age 31 and continuing until age 9. Your health care provider may recommend screening at age 10 if you are at increased risk. You will have tests every 1-10 years, depending on your results and the type of screening test.  Prostate cancer screening. Recommendations will vary depending on your family history and other risks.  Diabetes screening. This is done by checking your blood sugar (glucose) after you have not eaten for a while (fasting). You may have this done every 1-3 years.  Abdominal aortic aneurysm (AAA) screening. You may need this if you are a current or former smoker.  Sexually transmitted disease (STD) testing. Follow these instructions at home: Eating and drinking  Eat a diet that includes fresh fruits and vegetables, whole grains, lean protein, and low-fat dairy products. Limit your intake of foods with high amounts of sugar, saturated fats, and salt.  Take vitamin and mineral supplements as recommended by your health care provider.  Do not drink alcohol if your health care provider tells you not to drink.  If you drink alcohol: ? Limit how much you have to 0-2 drinks a day. ? Be aware of how much alcohol is in your drink. In the [place] one drink equals one 12 oz bottle of beer (355 mL), one 5 oz glass of Christopher Rubio (148 mL), or one 1 oz glass of hard liquor (44 mL). Lifestyle  Take daily care of your teeth and gums.  Stay  active. Exercise for at least 30 minutes on 5 or more days each week.  Do not use any products that contain nicotine or tobacco, such as cigarettes, e-cigarettes, and chewing tobacco. If you need help quitting, ask your health care provider.  If you are sexually active, practice safe sex. Use a condom or other form of protection to prevent STIs (sexually transmitted infections).  Talk with your health care provider about taking a low-dose aspirin or statin. What's next?  Visit your health care provider once a year for a well check visit.  Ask your health care provider how often you should have your eyes and teeth checked.  Stay up to date on all vaccines. This information is not intended to replace advice given to you by your health care provider. Make sure you discuss any questions you have with your health care provider. Document Released: 04/24/2015 Document Revised: 03/22/2018  Document Reviewed: 03/22/2018 Elsevier Patient Education  [employer].

## 2019-02-25 ENCOUNTER — Other Ambulatory Visit: Payer: Self-pay | Admitting: Internal Medicine

## 2019-02-25 DIAGNOSIS — I10 Essential (primary) hypertension: Secondary | ICD-10-CM

## 2019-02-25 MED ORDER — LISINOPRIL 20 MG PO TABS: 20.0000 mg | ORAL_TABLET | Freq: Every day | ORAL | 1 refills | Status: DC

## 2019-03-29 ENCOUNTER — Other Ambulatory Visit: Payer: Self-pay | Admitting: Internal Medicine

## 2019-03-29 DIAGNOSIS — I739 Peripheral vascular disease, unspecified: Secondary | ICD-10-CM

## 2019-03-29 MED ORDER — XARELTO 2.5 MG PO TABS: 2.5000 mg | ORAL_TABLET | Freq: Two times a day (BID) | ORAL | 1 refills | Status: DC

## 2019-04-20 ENCOUNTER — Other Ambulatory Visit: Payer: Self-pay | Admitting: Internal Medicine

## 2019-04-20 DIAGNOSIS — E785 Hyperlipidemia, unspecified: Secondary | ICD-10-CM

## 2019-04-20 DIAGNOSIS — I739 Peripheral vascular disease, unspecified: Secondary | ICD-10-CM

## 2019-05-07 ENCOUNTER — Other Ambulatory Visit: Payer: Self-pay | Admitting: Internal Medicine

## 2019-05-07 DIAGNOSIS — I10 Essential (primary) hypertension: Secondary | ICD-10-CM

## 2019-05-14 DIAGNOSIS — L821 Other seborrheic keratosis: Secondary | ICD-10-CM | POA: Diagnosis not present

## 2019-05-14 DIAGNOSIS — Z23 Encounter for immunization: Secondary | ICD-10-CM | POA: Diagnosis not present

## 2019-05-14 DIAGNOSIS — L570 Actinic keratosis: Secondary | ICD-10-CM | POA: Diagnosis not present

## 2019-05-14 DIAGNOSIS — L309 Dermatitis, unspecified: Secondary | ICD-10-CM | POA: Diagnosis not present

## 2019-06-05 ENCOUNTER — Other Ambulatory Visit: Payer: Self-pay | Admitting: Internal Medicine

## 2019-06-05 DIAGNOSIS — N401 Enlarged prostate with lower urinary tract symptoms: Secondary | ICD-10-CM

## 2019-06-05 DIAGNOSIS — N138 Other obstructive and reflux uropathy: Secondary | ICD-10-CM

## 2019-08-07 ENCOUNTER — Other Ambulatory Visit: Payer: Self-pay | Admitting: Internal Medicine

## 2019-08-07 DIAGNOSIS — I10 Essential (primary) hypertension: Secondary | ICD-10-CM

## 2019-08-13 ENCOUNTER — Telehealth: Payer: Self-pay | Admitting: Emergency Medicine

## 2019-08-14 NOTE — Telephone Encounter (Signed)
Error

## 2019-08-15 ENCOUNTER — Other Ambulatory Visit: Payer: Self-pay

## 2019-08-15 ENCOUNTER — Ambulatory Visit (INDEPENDENT_AMBULATORY_CARE_PROVIDER_SITE_OTHER): Payer: Medicare PPO | Admitting: Internal Medicine

## 2019-08-15 ENCOUNTER — Encounter: Payer: Self-pay | Admitting: Internal Medicine

## 2019-08-15 VITALS — BP 136/72 | HR 51 | Temp 98.2°F | Resp 16 | Ht 66.0 in | Wt 144.1 lb

## 2019-08-15 DIAGNOSIS — I10 Essential (primary) hypertension: Secondary | ICD-10-CM | POA: Diagnosis not present

## 2019-08-15 DIAGNOSIS — M1000 Idiopathic gout, unspecified site: Secondary | ICD-10-CM | POA: Diagnosis not present

## 2019-08-15 DIAGNOSIS — I739 Peripheral vascular disease, unspecified: Secondary | ICD-10-CM

## 2019-08-15 DIAGNOSIS — R011 Cardiac murmur, unspecified: Secondary | ICD-10-CM | POA: Diagnosis not present

## 2019-08-15 DIAGNOSIS — E781 Pure hyperglyceridemia: Secondary | ICD-10-CM

## 2019-08-15 DIAGNOSIS — N400 Enlarged prostate without lower urinary tract symptoms: Secondary | ICD-10-CM

## 2019-08-15 DIAGNOSIS — R001 Bradycardia, unspecified: Secondary | ICD-10-CM | POA: Diagnosis not present

## 2019-08-15 DIAGNOSIS — E785 Hyperlipidemia, unspecified: Secondary | ICD-10-CM

## 2019-08-15 DIAGNOSIS — Z0000 Encounter for general adult medical examination without abnormal findings: Secondary | ICD-10-CM

## 2019-08-15 DIAGNOSIS — N1831 Chronic kidney disease, stage 3a: Secondary | ICD-10-CM

## 2019-08-15 DIAGNOSIS — I350 Nonrheumatic aortic (valve) stenosis: Secondary | ICD-10-CM | POA: Insufficient documentation

## 2019-08-15 DIAGNOSIS — E519 Thiamine deficiency, unspecified: Secondary | ICD-10-CM

## 2019-08-15 LAB — HEPATIC FUNCTION PANEL
ALT: 15 U/L (ref 0–53)
AST: 15 U/L (ref 0–37)
Albumin: 4.3 g/dL (ref 3.5–5.2)
Alkaline Phosphatase: 82 U/L (ref 39–117)
Bilirubin, Direct: 0.1 mg/dL (ref 0.0–0.3)
Total Bilirubin: 0.5 mg/dL (ref 0.2–1.2)
Total Protein: 6.9 g/dL (ref 6.0–8.3)

## 2019-08-15 LAB — CBC WITH DIFFERENTIAL/PLATELET
Basophils Absolute: 0.2 10*3/uL — ABNORMAL HIGH (ref 0.0–0.1)
Basophils Relative: 2.6 % (ref 0.0–3.0)
Eosinophils Absolute: 0.4 10*3/uL (ref 0.0–0.7)
Eosinophils Relative: 5.1 % — ABNORMAL HIGH (ref 0.0–5.0)
HCT: 40.2 % (ref 39.0–52.0)
Hemoglobin: 13.2 g/dL (ref 13.0–17.0)
Lymphocytes Relative: 26.8 % (ref 12.0–46.0)
Lymphs Abs: 2.4 10*3/uL (ref 0.7–4.0)
MCHC: 32.9 g/dL (ref 30.0–36.0)
MCV: 91.9 fl (ref 78.0–100.0)
Monocytes Absolute: 1 10*3/uL (ref 0.1–1.0)
Monocytes Relative: 11.1 % (ref 3.0–12.0)
Neutro Abs: 4.8 10*3/uL (ref 1.4–7.7)
Neutrophils Relative %: 54.4 % (ref 43.0–77.0)
Platelets: 230 10*3/uL (ref 150.0–400.0)
RBC: 4.38 Mil/uL (ref 4.22–5.81)
RDW: 14.3 % (ref 11.5–15.5)
WBC: 8.8 10*3/uL (ref 4.0–10.5)

## 2019-08-15 LAB — URINALYSIS, ROUTINE W REFLEX MICROSCOPIC
Bilirubin Urine: NEGATIVE
Hgb urine dipstick: NEGATIVE
Ketones, ur: NEGATIVE
Leukocytes,Ua: NEGATIVE
Nitrite: NEGATIVE
RBC / HPF: NONE SEEN (ref 0–?)
Specific Gravity, Urine: 1.02 (ref 1.000–1.030)
Total Protein, Urine: NEGATIVE
Urine Glucose: NEGATIVE
Urobilinogen, UA: 0.2 (ref 0.0–1.0)
pH: 5.5 (ref 5.0–8.0)

## 2019-08-15 LAB — URIC ACID: Uric Acid, Serum: 8.7 mg/dL — ABNORMAL HIGH (ref 4.0–7.8)

## 2019-08-15 LAB — BASIC METABOLIC PANEL
BUN: 54 mg/dL — ABNORMAL HIGH (ref 6–23)
CO2: 25 mEq/L (ref 19–32)
Calcium: 9 mg/dL (ref 8.4–10.5)
Chloride: 108 mEq/L (ref 96–112)
Creatinine, Ser: 1.89 mg/dL — ABNORMAL HIGH (ref 0.40–1.50)
GFR: 34.19 mL/min — ABNORMAL LOW (ref >60.00)
Glucose, Bld: 104 mg/dL — ABNORMAL HIGH (ref 70–99)
Potassium: 5.7 mEq/L — ABNORMAL HIGH (ref 3.5–5.1)
Sodium: 137 mEq/L (ref 135–145)

## 2019-08-15 LAB — LIPID PANEL
Cholesterol: 201 mg/dL — ABNORMAL HIGH (ref 0–200)
HDL: 30.2 mg/dL — ABNORMAL LOW (ref >39.00)
NonHDL: 170.36
Total CHOL/HDL Ratio: 7
Triglycerides: 313 mg/dL — ABNORMAL HIGH (ref 0.0–149.0)
VLDL: 62.6 mg/dL — ABNORMAL HIGH (ref 0.0–40.0)

## 2019-08-15 LAB — LDL CHOLESTEROL, DIRECT: Direct LDL: 95 mg/dL

## 2019-08-15 MED ORDER — XARELTO 2.5 MG PO TABS: 2.5000 mg | ORAL_TABLET | Freq: Two times a day (BID) | ORAL | 1 refills | Status: DC

## 2019-08-15 MED ORDER — LISINOPRIL 20 MG PO TABS: 20.0000 mg | ORAL_TABLET | Freq: Every day | ORAL | 1 refills | Status: DC

## 2019-08-15 MED ORDER — TADALAFIL 5 MG PO TABS: ORAL_TABLET | ORAL | 1 refills | Status: DC

## 2019-08-15 NOTE — Progress Notes (Signed)
Subjective:  Patient ID: Christopher Aid., male    DOB: 10-25-1933  Age: 86 y.o. MRN: [id number]  CC: Hypertension  This visit occurred during the SARS-CoV-2 public health emergency.  Safety protocols were in place, including screening questions prior to the visit, additional usage of staff PPE, and extensive cleaning of exam room while observing appropriate contact time as indicated for disinfecting solutions.    HPI Christopher Rubio. presents for f/up - He has chronic unchanged symptoms that include occasional wheezing and shortness of breath with exertion.  He denies chest pain or diaphoresis with exertion.  He continues to smoke cigarettes.  He rarely takes Motrin and does not drink alcohol.  He does not complain of claudication and is compliant with the microdose of Xarelto.  Outpatient Medications Prior to Visit  Medication Sig Dispense Refill  . aspirin EC 81 MG tablet Take 1 tablet (81 mg total) by mouth daily. 90 tablet 1  . Cholecalciferol (VITAMIN D) 2000 UNITS CAPS Take 2,000 Units by mouth daily.    . colchicine 0.6 MG tablet Take 1 tablet (0.6 mg total) by mouth daily. (Patient taking differently: Take 0.6 mg by mouth as needed. ) 30 tablet 5  . ezetimibe (ZETIA) 10 MG tablet TAKE 1 TABLET(10 MG) BY MOUTH DAILY 90 tablet 1  . silodosin (RAPAFLO) 4 MG CAPS capsule TAKE 1 CAPSULE(4 MG) BY MOUTH DAILY WITH BREAKFAST 90 capsule 1  . thiamine (VITAMIN B-1) 50 MG tablet Take 1 tablet (50 mg total) by mouth daily. 90 tablet 0  . hydrochlorothiazide (MICROZIDE) 12.5 MG capsule TAKE 1 CAPLET BY MOUTH EVERY DAY 90 capsule 0  . lisinopril (ZESTRIL) 20 MG tablet Take 1 tablet (20 mg total) by mouth daily. 90 tablet 1  . rivaroxaban (XARELTO) 2.5 MG TABS tablet Take 1 tablet (2.5 mg total) by mouth 2 (two) times daily. 180 tablet 1  . tadalafil (CIALIS) 5 MG tablet TAKE 1 TABLET(5 MG) BY MOUTH DAILY AS NEEDED FOR ERECTILE DYSFUNCTION 90 tablet 1   No facility-administered  medications prior to visit.    ROS Review of Systems  Constitutional: Negative.  Negative for appetite change, chills, diaphoresis, fatigue, fever and unexpected weight change.  HENT: Negative.  Negative for trouble swallowing and voice change.   Eyes: Negative.   Respiratory: Positive for shortness of breath and wheezing. Negative for cough, chest tightness and stridor.   Cardiovascular: Negative for palpitations and leg swelling.  Gastrointestinal: Negative for abdominal pain, blood in stool, constipation, diarrhea, nausea and vomiting.  Endocrine: Negative.   Genitourinary: Positive for difficulty urinating. Negative for dysuria.  Musculoskeletal: Negative.  Negative for arthralgias and myalgias.  Skin: Negative.  Negative for color change.  Neurological: Negative.  Negative for dizziness, weakness, light-headedness and numbness.  Hematological: Negative for adenopathy. Does not bruise/bleed easily.  Psychiatric/Behavioral: Positive for confusion.    Objective:  BP 136/72 (BP Location: Left Arm, Patient Position: Sitting, Cuff Size: Normal)   Pulse (!) 51   Temp 98.2 F (36.8 C) (Oral)   Resp 16   Ht 5\' 6"  (1.676 m)   Wt 144 lb 2 oz (65.4 kg)   SpO2 98%   BMI 23.26 kg/m   BP Readings from Last 3 Encounters:  08/15/19 136/72  02/05/19 138/82  09/11/18 140/70    Wt Readings from Last 3 Encounters:  08/15/19 144 lb 2 oz (65.4 kg)  02/05/19 140 lb (63.5 kg)  09/11/18 144 lb (65.3 kg)    Physical Exam  Vitals reviewed.  Constitutional:      Appearance: Normal appearance.  HENT:     Nose: Nose normal.     Mouth/Throat:     Mouth: Mucous membranes are moist.  Eyes:     General: No scleral icterus.    Conjunctiva/sclera: Conjunctivae normal.  Cardiovascular:     Rate and Rhythm: Regular rhythm. Bradycardia present.     Heart sounds: Murmur present. Systolic murmur present with a grade of 2/6. No diastolic murmur. No friction rub. No gallop.      Comments: EKG  - Sinus bradycardia, 47 bpm Otherwise normal EKG Pulmonary:     Effort: Pulmonary effort is normal.     Breath sounds: No stridor. No wheezing, rhonchi or rales.  Abdominal:     General: Abdomen is flat. Bowel sounds are normal. There is no distension.     Palpations: Abdomen is soft. There is no hepatomegaly, splenomegaly or mass.     Tenderness: There is no abdominal tenderness.  Musculoskeletal:        General: No swelling.     Cervical back: Neck supple.     Right lower leg: No edema.     Left lower leg: No edema.  Feet:     Right foot:     Skin integrity: Skin integrity normal. No ulcer or skin breakdown.     Toenail Condition: Right toenails are normal.     Left foot:     Skin integrity: Skin integrity normal. No ulcer or skin breakdown.     Toenail Condition: Left toenails are normal.  Lymphadenopathy:     Cervical: No cervical adenopathy.  Skin:    General: Skin is warm and dry.  Neurological:     General: No focal deficit present.     Mental Status: He is alert. Mental status is at baseline.  Psychiatric:        Mood and Affect: Mood normal.        Behavior: Behavior normal.     Lab Results  Component Value Date   WBC 8.8 08/15/2019   HGB 13.2 08/15/2019   HCT 40.2 08/15/2019   PLT 230.0 08/15/2019   GLUCOSE 104 (H) 08/15/2019   CHOL 201 (H) 08/15/2019   TRIG 313.0 (H) 08/15/2019   HDL 30.20 (L) 08/15/2019   LDLDIRECT 95.0 08/15/2019   LDLCALC 111 (H) 06/13/2017   ALT 15 08/15/2019   AST 15 08/15/2019   NA 137 08/15/2019   K 5.7 No hemolysis seen (H) 08/15/2019   CL 108 08/15/2019   CREATININE 1.89 (H) 08/15/2019   BUN 54 (H) 08/15/2019   CO2 25 08/15/2019   TSH 2.62 06/06/2016   PSA 2.40 12/11/2014    VAS [place] AAA DUPLEX  Result Date: 05/17/2018 ABDOMINAL AORTA STUDY Indications: abdominal bruit Risk Factors: Hypertension, hyperlipidemia, current smoker. Limitations: Air/bowel gas.  Performing Technologist: Burley Saver RVT  Examination Guidelines: A  complete evaluation includes B-mode imaging, spectral Doppler, color Doppler, and power Doppler as needed of all accessible portions of each vessel. Bilateral testing is considered an integral part of a complete examination. Limited examinations for reoccurring indications may be performed as noted.  Abdominal Aorta Findings: +-----------+-------+----------+----------+--------+--------+--------+ Location   AP (cm)Trans (cm)PSV (cm/s)WaveformThrombusComments +-----------+-------+----------+----------+--------+--------+--------+ Proximal   1.76   1.42      98        biphasic                 +-----------+-------+----------+----------+--------+--------+--------+ Mid        1.66  1.52      101       biphasic                 +-----------+-------+----------+----------+--------+--------+--------+ Distal     0.84   0.90      225       biphasic        plaque   +-----------+-------+----------+----------+--------+--------+--------+ RT CIA Prox1.1    1.0       236       biphasic        plaque   +-----------+-------+----------+----------+--------+--------+--------+ LT CIA Prox1.0    1.0       130       biphasic                 +-----------+-------+----------+----------+--------+--------+--------+  Visualization of the Distal Abdominal Aorta, Right CIA Proximal artery and Left CIA Proximal artery was limited.  Summary: Abdominal Aorta: No evidence of an abdominal aortic aneurysm was visualized. The largest aortic measurement is 1.8 cm. Plaque noted in the distal abdominal aorta, with velocities of 225 cm/s, which are suggestive of a >50% stenosis. Plaque and elevated velocities in the right common iliac artery are suggestive of a >50% stenosis. No previous exam available for comparison.  *See table(s) above for measurements and observations.  Electronically signed by Ruta Hinds MD on 05/17/2018 at 1:42:42 PM.   Final     Assessment & Plan:   Christopher Rubio was seen today for  hypertension.  Diagnoses and all orders for this visit:  Thiamine deficiency- I will monitor his thiamine level. -     CBC with Differential/Platelet; Future -     Vitamin B1; Future -     Vitamin B1 -     CBC with Differential/Platelet  Essential hypertension- His blood pressure is adequately well controlled but he has developed hyperkalemia and a gradual decrease in his renal function.  I have asked him to stop taking lisinopril and hydrochlorothiazide. -     EKG 12-Lead -     Discontinue: lisinopril (ZESTRIL) 20 MG tablet; Take 1 tablet (20 mg total) by mouth daily.  Stage 3a chronic kidney disease- See above.  I have asked her to avoid nephrotoxic agents like ibuprofen and other NSAIDs. -     Basic metabolic panel; Future -     Urinalysis, Routine w reflex microscopic; Future -     Urinalysis, Routine w reflex microscopic -     Basic metabolic panel  Routine health maintenance  Hypertriglyceridemia-  His triglycerides are high but not high enough to require medical treatment. -     Lipid panel; Future -     Lipid panel  Hyperlipidemia with target LDL less than 100- He is not willing to take a statin.  Will continue zetia. -     Lipid panel; Future -     Hepatic function panel; Future -     Hepatic function panel -     Lipid panel  Idiopathic gout, unspecified chronicity, unspecified site- His uric acid level is mildly elevated at 8.7 but he is having no symptoms of gouty arthropathy.  Will continue the current dose of colchicine. -     Uric acid; Future -     Uric acid  PAD (peripheral artery disease) (HCC)-I recommended that he continue the micro dose of Xarelto to prevent limb threatening complications. -     rivaroxaban (XARELTO) 2.5 MG TABS tablet; Take 1 tablet (2.5 mg total) by mouth 2 (two) times daily. -  Discontinue: lisinopril (ZESTRIL) 20 MG tablet; Take 1 tablet (20 mg total) by mouth daily.  Murmur- See below. -     EKG 12-Lead -     Ambulatory referral  to Cardiology  Bradycardia- He is asymptomatic with this but I have asked him to see cardiology to have his bradycardia and murmur evaluated. -     Ambulatory referral to Cardiology  BPH without urinary obstruction -     tadalafil (CIALIS) 5 MG tablet; TAKE 1 TABLET(5 MG) BY MOUTH DAILY AS NEEDED FOR ERECTILE DYSFUNCTION  Other orders -     LDL cholesterol, direct   I have discontinued Nicasio L. Mulcahey Jr.'s lisinopril, hydrochlorothiazide, and lisinopril. I am also having him maintain his Vitamin D, colchicine, aspirin EC, thiamine, ezetimibe, silodosin, Xarelto, and tadalafil.  Meds ordered this encounter  Medications  . rivaroxaban (XARELTO) 2.5 MG TABS tablet    Sig: Take 1 tablet (2.5 mg total) by mouth 2 (two) times daily.    Dispense:  180 tablet    Refill:  1  . tadalafil (CIALIS) 5 MG tablet    Sig: TAKE 1 TABLET(5 MG) BY MOUTH DAILY AS NEEDED FOR ERECTILE DYSFUNCTION    Dispense:  90 tablet    Refill:  1  . DISCONTD: lisinopril (ZESTRIL) 20 MG tablet    Sig: Take 1 tablet (20 mg total) by mouth daily.    Dispense:  90 tablet    Refill:  1   I spent 60 minutes in preparing to see the patient by review of recent labs, imaging and procedures, obtaining and reviewing separately obtained history, communicating with the patient and family or caregiver, ordering medications, tests or procedures, and documenting clinical information in the EHR including the differential Dx, treatment, and any further evaluation and other management of 1. Thiamine deficiency 2. Essential hypertension 3. Stage 3a chronic kidney disease 4. Hypertriglyceridemia 5. Hyperlipidemia with target LDL less than 100 6. Idiopathic gout, unspecified chronicity, unspecified site 7. PAD (peripheral artery disease) ([place]) 8. Murmur 9. Bradycardia 10. BPH without urinary obstruction   Follow-up: Return in about 3 months (around 11/15/2019).  Scarlette Calico, MD

## 2019-08-15 NOTE — Patient Instructions (Signed)
Bradycardia, Adult Bradycardia is a slower-than-normal heartbeat. A normal resting heart rate for an adult ranges from 60 to 100 beats per minute. With bradycardia, the resting heart rate is less than 60 beats per minute. Bradycardia can prevent enough oxygen from reaching certain areas of your body when you are active. It can be serious if it keeps enough oxygen from reaching your brain and other parts of your body. Bradycardia is not a problem for everyone. For some healthy adults, a slow resting heart rate is normal. What are the causes? This condition may be caused by:  A problem with the heart, including: ? A problem with the heart's electrical system, such as a heart block. With a heart block, electrical signals between the chambers of the heart are partially or completely blocked, so they are not able to work as they should. ? A problem with the heart's natural pacemaker (sinus node). ? Heart disease. ? A heart attack. ? Heart damage. ? Lyme disease. ? A heart infection. ? A heart condition that is present at birth (congenital heart defect).  Certain medicines that treat heart conditions.  Certain conditions, such as hypothyroidism and obstructive sleep apnea.  Problems with the balance of chemicals and other substances, like potassium, in the blood.  Trauma.  Radiation therapy. What increases the risk? You are more likely to develop this condition if you:  Are age 65 or older.  Have high blood pressure (hypertension), high cholesterol (hyperlipidemia), or diabetes.  Drink heavily, use tobacco or nicotine products, or use drugs. What are the signs or symptoms? Symptoms of this condition include:  Light-headedness.  Feeling faint or fainting.  Fatigue and weakness.  Trouble with activity or exercise.  Shortness of breath.  Chest pain (angina).  Drowsiness.  Confusion.  Dizziness. How is this diagnosed? This condition may be diagnosed based on:  Your  symptoms.  Your medical history.  A physical exam. During the exam, your health care provider will listen to your heartbeat and check your pulse. To confirm the diagnosis, your health care provider may order tests, such as:  Blood tests.  An electrocardiogram (ECG). This test records the heart's electrical activity. The test can show how fast your heart is beating and whether the heartbeat is steady.  A test in which you wear a portable device (event recorder or Holter monitor) to record your heart's electrical activity while you go about your day.  Anexercise test. How is this treated? Treatment for this condition depends on the cause of the condition and how severe your symptoms are. Treatment may involve:  Treatment of the underlying condition.  Changing your medicines or how much medicine you take.  Having a small, battery-operated device called a pacemaker implanted under the skin. When bradycardia occurs, this device can be used to increase your heart rate and help your heart beat in a regular rhythm. Follow these instructions at home: Lifestyle   Manage any health conditions that contribute to bradycardia as told by your health care provider.  Follow a heart-healthy diet. A nutrition specialist (dietitian) can help educate you about healthy food options and changes.  Follow an exercise program that is approved by your health care provider.  Maintain a healthy weight.  Try to reduce or manage your stress, such as with yoga or meditation. If you need help reducing stress, ask your health care provider.  Do not use any products that contain nicotine or tobacco, such as cigarettes, e-cigarettes, and chewing tobacco. If you need help   quitting, ask your health care provider.  Do not use illegal drugs.  Limit alcohol intake to no more than 1 drink a day for nonpregnant women and 2 drinks a day for men. Be aware of how much alcohol is in your drink. In the [place] one drink  equals one 12 oz bottle of beer (355 mL), one 5 oz glass of wine (148 mL), or one 1 oz glass of hard liquor (44 mL). General instructions  Take over-the-counter and prescription medicines only as told by your health care provider.  Keep all follow-up visits as told by your health care provider. This is important. How is this prevented? In some cases, bradycardia may be prevented by:  Treating underlying medical problems.  Stopping behaviors or medicines that can trigger the condition. Contact a health care provider if you:  Feel light-headed or dizzy.  Almost faint.  Feel weak or are easily fatigued during physical activity.  Experience confusion or have memory problems. Get help right away if:  You faint.  You have: ? An irregular heartbeat (palpitations). ? Chest pain. ? Trouble breathing. Summary  Bradycardia is a slower-than-normal heartbeat. With bradycardia, the resting heart rate is less than 60 beats per minute.  Treatment for this condition depends on the cause.  Manage any health conditions that contribute to bradycardia as told by your health care provider.  Do not use any products that contain nicotine or tobacco, such as cigarettes, e-cigarettes, and chewing tobacco, and limit alcohol intake.  Keep all follow-up visits as told by your health care provider. This is important. This information is not intended to replace advice given to you by your health care provider. Make sure you discuss any questions you have with your health care provider. Document Revised: 10/09/2017 Document Reviewed: 09/06/2017 Elsevier Patient Education  2020 [employer]  

## 2019-08-19 ENCOUNTER — Ambulatory Visit: Payer: Medicare PPO | Admitting: Cardiovascular Disease

## 2019-08-19 ENCOUNTER — Encounter: Payer: Self-pay | Admitting: *Deleted

## 2019-08-19 ENCOUNTER — Other Ambulatory Visit: Payer: Self-pay

## 2019-08-19 ENCOUNTER — Encounter: Payer: Self-pay | Admitting: Cardiovascular Disease

## 2019-08-19 VITALS — BP 170/80 | HR 72 | Ht 64.0 in | Wt 143.0 lb

## 2019-08-19 DIAGNOSIS — R001 Bradycardia, unspecified: Secondary | ICD-10-CM

## 2019-08-19 DIAGNOSIS — R0602 Shortness of breath: Secondary | ICD-10-CM

## 2019-08-19 DIAGNOSIS — I10 Essential (primary) hypertension: Secondary | ICD-10-CM | POA: Diagnosis not present

## 2019-08-19 DIAGNOSIS — R011 Cardiac murmur, unspecified: Secondary | ICD-10-CM | POA: Diagnosis not present

## 2019-08-19 DIAGNOSIS — R42 Dizziness and giddiness: Secondary | ICD-10-CM

## 2019-08-19 DIAGNOSIS — I771 Stricture of artery: Secondary | ICD-10-CM

## 2019-08-19 HISTORY — DX: Shortness of breath: R06.02

## 2019-08-19 LAB — VITAMIN B1: Vitamin B1 (Thiamine): 21 nmol/L (ref 8–30)

## 2019-08-19 MED ORDER — AMLODIPINE BESYLATE 5 MG PO TABS
5.0000 mg | ORAL_TABLET | Freq: Every day | ORAL | 1 refills | Status: DC
Start: 2019-08-19 — End: 2020-01-28

## 2019-08-19 NOTE — Patient Instructions (Signed)
Medication Instructions:  START AMLODIPINE 5 MG DAILY   *If you need a refill on your cardiac medications before your next appointment, please call your pharmacy*  Lab Work: NONE   Testing/Procedures: Your physician has requested that you have an echocardiogram. Echocardiography is a painless test that uses sound waves to create images of your heart. It provides your doctor with information about the size and shape of your heart and how well your heart's chambers and valves are working. This procedure takes approximately one hour. There are no restrictions for this procedure. [place] [address]   Your physician has recommended that you wear an event monitor. Event monitors are medical devices that record the heart's electrical activity. Doctors most often [place] these monitors to diagnose arrhythmias. Arrhythmias are problems with the speed or rhythm of the heartbeat. The monitor is a small, portable device. You can wear one while you do your normal daily activities. This is usually used to diagnose what is causing palpitations/syncope (passing out). 6 DAY EVENT   Follow-Up: At [facility], you and your health needs are our priority.  As part of our continuing mission to provide you with exceptional heart care, we have created designated Provider Care Teams.  These Care Teams include your primary Cardiologist (physician) and Advanced Practice Providers (APPs -  Physician Assistants and Nurse Practitioners) who all work together to provide you with the care you need, when you need it.  We recommend signing up for the patient portal called "MyChart".  Sign up information is provided on this After Visit Summary.  MyChart is used to connect with patients for Virtual Visits (Telemedicine).  Patients are able to view lab/test results, encounter notes, upcoming appointments, etc.  Non-urgent messages can be sent to your provider as well.   To learn more about what you can do with  MyChart, go to [email or web address].    Your next appointment:   6 week(s)  The format for your next appointment:   In Person  Provider:   You may see DR [facility]  or one of the following Advanced Practice Providers on your designated Care Team:    Kerin Ransom, PA-C  [place], [place]  Coletta Memos, [place]  Other Instructions  [employer] Cardiac Event Monitor Instructions Your physician has requested you wear your cardiac event monitor for _14_ days, (1-30). [employer] may call or text to confirm a shipping address. The monitor will be sent to a land address via UPS. [employer] will not ship a monitor to a PO BOX. It typically takes 3-5 days to receive your monitor after it has been enrolled. [employer] will assist with USPS tracking if your package is delayed. The telephone number for [employer] is [phone]. Once you have received your monitor, please review the enclosed instructions. Instruction tutorials can also be viewed under help and settings on the enclosed cell phone. Your monitor has already been registered assigning a specific monitor serial # to you.  Applying the monitor Remove cell phone from case and turn it on. The cell phone works as Dealer and needs to be within [employer] of you at all times. The cell phone will need to be charged on a daily basis. We recommend you plug the cell phone into the enclosed charger at your bedside table every night.  Monitor batteries: You will receive two monitor batteries labelled #1 and #2. These are your recorders. Plug battery #2 onto the second connection on the enclosed charger. Keep  one battery on the charger at all times. This will keep the monitor battery deactivated. It will also keep it fully charged for when you need to switch your monitor batteries. A small light will be blinking on the battery emblem when it is charging. The light on the battery emblem will remain on when the battery is fully  charged.  Open package of a Monitor strip. Insert battery #1 into black hood on strip and gently squeeze monitor battery onto connection as indicated in instruction booklet. Set aside while preparing skin.  Choose location for your strip, vertical or horizontal, as indicated in the instruction booklet. Shave to remove all hair from location. There cannot be any lotions, oils, powders, or colognes on skin where monitor is to be applied. Wipe skin clean with enclosed Saline wipe. Dry skin completely.  Peel paper labeled #1 off the back of the Monitor strip exposing the adhesive. Place the monitor on the chest in the vertical or horizontal position shown in the instruction booklet. One arrow on the monitor strip must be pointing upward. Carefully remove paper labeled #2, attaching remainder of strip to your skin. Try not to create any folds or wrinkles in the strip as you apply it.  Firmly press and release the circle in the center of the monitor battery. You will hear a small beep. This is turning the monitor battery on. The heart emblem on the monitor battery will light up every 5 seconds if the monitor battery in turned on and connected to the patient securely. Do not push and hold the circle down as this turns the monitor battery off. The cell phone will locate the monitor battery. A screen will appear on the cell phone checking the connection of your monitor strip. This may read poor connection initially but change to good connection within the next minute. Once your monitor accepts the connection you will hear a series of 3 beeps followed by a climbing crescendo of beeps. A screen will appear on the cell phone showing the two monitor strip placement options. Touch the picture that demonstrates where you applied the monitor strip.  Your monitor strip and battery are waterproof. You are able to shower, bathe, or swim with the monitor on. They just ask you do not submerge deeper than 3 feet  underwater. We recommend removing the monitor if you are swimming in a lake, river, or ocean.  Your monitor battery will need to be switched to a fully charged monitor battery approximately once a week. The cell phone will alert you of an action which needs to be made.  On the cell phone, tap for details to reveal connection status, monitor battery status, and cell phone battery status. The green dots indicates your monitor is in good status. A red dot indicates there is something that needs your attention.  To record a symptom, click the circle on the monitor battery. In 30-60 seconds a list of symptoms will appear on the cell phone. Select your symptom and tap save. Your monitor will record a sustained or significant arrhythmia regardless of you clicking the button. Some patients do not feel the heart rhythm irregularities. [employer] will notify us of any serious or critical events.  Refer to instruction booklet for instructions on switching batteries, changing strips, the Do not disturb or Pause features, or any additional questions.  Call [employer] at [phone], to confirm your monitor is transmitting and record your baseline. They will answer any questions you may have regarding the monitor  instructions at that time.  Returning the monitor to [place] all equipment back into blue box. Peel off strip of paper to expose adhesive and close box securely. There is a prepaid UPS shipping label on this box. Drop in a UPS drop box, or at a UPS facility like [employer]. You may also contact [employer] to arrange UPS to pick up monitor package at your home.  Echocardiogram An echocardiogram is a procedure that uses painless sound waves (ultrasound) to produce an image of the heart. Images from an echocardiogram can provide important information about:  Signs of coronary artery disease (CAD).  Aneurysm detection. An aneurysm is a weak or damaged part of an artery wall that bulges  out from the normal force of blood pumping through the body.  Heart size and shape. Changes in the size or shape of the heart can be associated with certain conditions, including heart failure, aneurysm, and CAD.  Heart muscle function.  Heart valve function.  Signs of a past heart attack.  Fluid buildup around the heart.  Thickening of the heart muscle.  A tumor or infectious growth around the heart valves. Tell a health care provider about:  Any allergies you have.  All medicines you are taking, including vitamins, herbs, eye drops, creams, and over-the-counter medicines.  Any blood disorders you have.  Any surgeries you have had.  Any medical conditions you have.  Whether you are pregnant or may be pregnant. What are the risks? Generally, this is a safe procedure. However, problems may occur, including:  Allergic reaction to dye (contrast) that may be used during the procedure. What happens before the procedure? No specific preparation is needed. You may eat and drink normally. What happens during the procedure?   An IV tube may be inserted into one of your veins.  You may receive contrast through this tube. A contrast is an injection that improves the quality of the pictures from your heart.  A gel will be applied to your chest.  A wand-like tool (transducer) will be moved over your chest. The gel will help to transmit the sound waves from the transducer.  The sound waves will harmlessly bounce off of your heart to allow the heart images to be captured in real-time motion. The images will be recorded on a computer. The procedure may vary among health care providers and hospitals. What happens after the procedure?  You may return to your normal, everyday life, including diet, activities, and medicines, unless your health care provider tells you not to do that. Summary  An echocardiogram is a procedure that uses painless sound waves (ultrasound) to produce an  image of the heart.  Images from an echocardiogram can provide important information about the size and shape of your heart, heart muscle function, heart valve function, and fluid buildup around your heart.  You do not need to do anything to prepare before this procedure. You may eat and drink normally.  After the echocardiogram is completed, you may return to your normal, everyday life, unless your health care provider tells you not to do that. This information is not intended to replace advice given to you by your health care provider. Make sure you discuss any questions you have with your health care provider. Document Revised: 07/19/2018 Document Reviewed: 04/30/2016 Elsevier Patient Education  [place].

## 2019-08-19 NOTE — Progress Notes (Signed)
Cardiology Office Note   Date:  08/19/2019   ID:  [name], DOB [birth year], MRN [id number]  PCP:  Christopher Lima, MD  Cardiologist:   Skeet Latch, MD   No chief complaint on file.    History of Present Illness: Christopher Rubio. is a 86 y.o. male with hypertension, hyperlipidemia, right iliac stenosis, prior subarachnoid hemorrhage and ongoing tobacco abuse who is being seen today for the evaluation of shortness of breath at the request of Christopher Lima, MD.  He saw Dr. Ronnald Rubio on 5/6 and reported shortness of breawth and was noted ot have a murmur on exam.  He was bradycardic to 47 bpm.  At that time his blood pressure was controlled but he was noted to be hyperkalemic.  Both lisinopril and hydrochlorothiazide were discontinued.  He is very active.  He likes to do a lot of yard work and [employer].  He has no exertional chest pain.  He does get short of breath at times.  But he attributes this to his long-term smoking.  He smokes three quarters of a pack daily.  He is not interested in quitting at this time.  He was able to successfully quit once for 2 weeks.  He is accompanied today by a friend who notes that he is quite anxious and he admits that this is a big part of why he continues to smoke.  [name] has occasional episodes of lightheadedness.  It typically happens when he bends over.  He denies any recent syncope.  He sometimes gets dizzy when standing.  This feeling lasts for a couple seconds.  He passed out 1.5 years ago in the setting of intravascular volume depletion.  It was 100 degrees outside and he passed out after sitting down.  His friend notes that there was another episode as well that he does not recall.  He started drinking Gatorade and this does seem to help.  He complains of easy bruising.  His ankles swell at times.  He denies orthopnea or PND.  He notes that he has had a heart murmur and skipped heartbeats his whole life.  Past Medical History:   Diagnosis Date  . Anemia   . Atrial fibrillation ([place])   . Basal cell carcinoma of skin, site unspecified   . Benign neoplasm of colon   . Contact dermatitis and other eczema, due to unspecified cause   . Diverticulosis of colon (without mention of hemorrhage)   . Extrinsic asthma, unspecified   . Gout, unspecified   . Impotence of organic origin   . Other and unspecified hyperlipidemia   . Shortness of breath 08/19/2019  . Unspecified essential hypertension     Past Surgical History:  Procedure Laterality Date  . TONSILLECTOMY       Current Outpatient Medications  Medication Sig Dispense Refill  . amLODipine (NORVASC) 5 MG tablet Take 1 tablet (5 mg total) by mouth daily. 90 tablet 1  . aspirin EC 81 MG tablet Take 1 tablet (81 mg total) by mouth daily. 90 tablet 1  . Cholecalciferol (VITAMIN D) 2000 UNITS CAPS Take 2,000 Units by mouth daily.    . colchicine 0.6 MG tablet Take 1 tablet (0.6 mg total) by mouth daily. (Patient taking differently: Take 0.6 mg by mouth as needed. ) 30 tablet 5  . ezetimibe (ZETIA) 10 MG tablet TAKE 1 TABLET(10 MG) BY MOUTH DAILY 90 tablet 1  . rivaroxaban (XARELTO) 2.5 MG TABS tablet Take  1 tablet (2.5 mg total) by mouth 2 (two) times daily. 180 tablet 1  . silodosin (RAPAFLO) 4 MG CAPS capsule TAKE 1 CAPSULE(4 MG) BY MOUTH DAILY WITH BREAKFAST 90 capsule 1  . tadalafil (CIALIS) 5 MG tablet TAKE 1 TABLET(5 MG) BY MOUTH DAILY AS NEEDED FOR ERECTILE DYSFUNCTION 90 tablet 1  . thiamine (VITAMIN B-1) 50 MG tablet Take 1 tablet (50 mg total) by mouth daily. 90 tablet 0   No current facility-administered medications for this visit.    Allergies:   Statins, Codeine, Neomycin-bacitracin zn-polymyx, Penicillins, and Prednisone    Social History:  The patient  reports that he has been smoking cigarettes. He has a 30.00 pack-year smoking history. He has never used smokeless tobacco. He reports current alcohol use of about 10.0 standard drinks of alcohol  per week. He reports that he does not use drugs.   Family History:  The patient's family history includes Bladder Cancer in his brother; Cancer in his brother; Coronary artery disease (age of onset: 60) in his father; Heart attack in his father; Heart disease in his father; Hypertension in his mother; Stroke in his mother.    ROS:  Please see the history of present illness.   Otherwise, review of systems are positive for none.   All other systems are reviewed and negative.    PHYSICAL EXAM: VS:  BP (!) 170/80   Pulse 72   Ht 5\' 4"  (1.626 m)   Wt 143 lb (64.9 kg)   SpO2 98%   BMI 24.55 kg/m  , BMI Body mass index is 24.55 kg/m. GENERAL:  Well appearing HEENT:  Pupils equal round and reactive, fundi not visualized, oral mucosa unremarkable NECK:  No jugular venous distention, waveform within normal limits, carotid upstroke brisk and symmetric, no bruits LUNGS:  Clear to auscultation bilaterally HEART:  RRR.  Frequent ectopy.  PMI not displaced or sustained,S1 and S2 within normal limits, no S3, no S4, no clicks, no rubs, III/VI mid-peaking systolic murmur with radiation to the carotids ABD:  Flat, positive bowel sounds normal in frequency in pitch, no bruits, no rebound, no guarding, no midline pulsatile mass, no hepatomegaly, no splenomegaly EXT:  2 plus pulses throughout, no edema, no cyanosis no clubbing SKIN:  No rashes no nodules NEURO:  Cranial nerves II through XII grossly intact, motor grossly intact throughout PSYCH:  Cognitively intact, oriented to person place and time   EKG:  EKG is not ordered today.   Recent Labs: 08/15/2019: ALT 15; BUN 54; Creatinine, Ser 1.89; Hemoglobin 13.2; Platelets 230.0; Potassium 5.7 No hemolysis seen; Sodium 137    Lipid Panel    Component Value Date/Time   CHOL 201 (H) 08/15/2019 0939   TRIG 313.0 (H) 08/15/2019 0939   HDL 30.20 (L) 08/15/2019 0939   CHOLHDL 7 08/15/2019 0939   VLDL 62.6 (H) 08/15/2019 0939   LDLCALC 111 (H)  06/13/2017 0904   LDLDIRECT 95.0 08/15/2019 0939      Wt Readings from Last 3 Encounters:  08/19/19 143 lb (64.9 kg)  08/15/19 144 lb 2 oz (65.4 kg)  02/05/19 140 lb (63.5 kg)      ASSESSMENT AND PLAN:  # Shortness of breath: # Murmur: There is no evidence of heart failure on exam.  This may be attributable to his tobacco abuse. However he also has a systolic murmur on exam that sounds like aortic stenosis.  We will get an echocardiogram to assess.  If the above testing is negative, would consider  an ischemia evaluation.  He is pretty hesitant about doctors in interventions.  Therefore we will focus on the other testing listed for now.  # Dizziness: # Fatigue:  # Bradycardia:  # Syncope: His episodes of syncope seem to occur in the setting of working outside on hot days.  He has started drinking Gatorade and this seems to have helped.  He has documented bradycardia of unclear significance.  We will have a 14-day event monitor to better assess his bradycardia and any associated symptoms.  Check thyroid at follow up.  # PAD:  # Hyperlipidemia: Minimal claudication symptoms.  Continue aspirin, Zetia and Xarelto.  He doesn't tolerate statins.  He follows with Dr. Donnetta Hutching.  Discuss PCSK9 inhibitors at follow up.  # Tobacco abuse: Advised cessation.  He is going to talk with Dr. Ronnald Rubio about his anxiety which may make him more successful at quitting.    Current medicines are reviewed at length with the patient today.  The patient does not have concerns regarding medicines.  The following changes have been made:  no change  Labs/ tests ordered today include:   Orders Placed This Encounter  Procedures  . CARDIAC EVENT MONITOR  . ECHOCARDIOGRAM COMPLETE     Disposition:   FU with Lysette Lindenbaum C. Oval Linsey, MD, [facility] in 6 weeks.      Signed, Geremiah Fussell C. Oval Linsey, MD, [facility]  08/19/2019 12:16 PM    [place]

## 2019-08-19 NOTE — Progress Notes (Signed)
Patient ID: Christopher Aid., male   DOB: 04-Mar-1934, 86 y.o.   MRN: [id number] Patient enrolled for [employer] to ship a 14 day cardiac event monitor to his home.

## 2019-08-21 NOTE — Telephone Encounter (Signed)
error 

## 2019-08-26 ENCOUNTER — Encounter (INDEPENDENT_AMBULATORY_CARE_PROVIDER_SITE_OTHER): Payer: Medicare PPO

## 2019-08-26 DIAGNOSIS — R42 Dizziness and giddiness: Secondary | ICD-10-CM

## 2019-08-26 DIAGNOSIS — R001 Bradycardia, unspecified: Secondary | ICD-10-CM

## 2019-08-26 DIAGNOSIS — R0602 Shortness of breath: Secondary | ICD-10-CM

## 2019-08-26 DIAGNOSIS — R011 Cardiac murmur, unspecified: Secondary | ICD-10-CM

## 2019-09-04 ENCOUNTER — Other Ambulatory Visit: Payer: Self-pay

## 2019-09-04 ENCOUNTER — Ambulatory Visit (HOSPITAL_COMMUNITY): Payer: Medicare PPO | Attending: Cardiology

## 2019-09-04 DIAGNOSIS — R011 Cardiac murmur, unspecified: Secondary | ICD-10-CM | POA: Diagnosis not present

## 2019-09-04 DIAGNOSIS — R001 Bradycardia, unspecified: Secondary | ICD-10-CM | POA: Insufficient documentation

## 2019-09-04 DIAGNOSIS — R0602 Shortness of breath: Secondary | ICD-10-CM | POA: Diagnosis not present

## 2019-09-04 DIAGNOSIS — R42 Dizziness and giddiness: Secondary | ICD-10-CM | POA: Insufficient documentation

## 2019-09-05 ENCOUNTER — Other Ambulatory Visit: Payer: Self-pay | Admitting: Internal Medicine

## 2019-09-05 DIAGNOSIS — I739 Peripheral vascular disease, unspecified: Secondary | ICD-10-CM

## 2019-09-19 ENCOUNTER — Telehealth: Payer: Self-pay | Admitting: *Deleted

## 2019-09-19 DIAGNOSIS — R0602 Shortness of breath: Secondary | ICD-10-CM

## 2019-09-19 NOTE — Telephone Encounter (Addendum)
Left message to call back    Skeet Latch, MD  09/18/2019 5:27 PM EDT Back to Top    OK. If he thinks he can walk on a treadmill he can have an ETT.   Waylan Rocher, LPN  11/09/173 10:25 AM EDT     Pt informed of providers result & recommendations. Pt verbalized understanding. Pt states that he does not want to do Lexiscan, someone told him it was horrible and he WILL NOT do this.   Skeet Latch, MD  09/08/2019 9:05 AM EDT     Echo shows that his heart is squeezing well. Moderate aortic stenosis. This isn't enough to cause his shortness of breath but needs to be followed over time. Repeat in one year. Recommend getting a Lexiscan Myoview to make sure that coronary diseas isn't causing his shortness of breath.

## 2019-09-19 NOTE — Telephone Encounter (Signed)
Advised patient, verbalized understanding  

## 2019-09-24 ENCOUNTER — Telehealth (HOSPITAL_COMMUNITY): Payer: Self-pay

## 2019-09-24 NOTE — Telephone Encounter (Signed)
Encounter complete. 

## 2019-09-25 ENCOUNTER — Telehealth (HOSPITAL_COMMUNITY): Payer: Self-pay

## 2019-09-25 NOTE — Telephone Encounter (Signed)
Encounter complete. 

## 2019-09-26 ENCOUNTER — Ambulatory Visit (HOSPITAL_COMMUNITY)
Admission: RE | Admit: 2019-09-26 | Discharge: 2019-09-26 | Disposition: A | Discharge status: Home / Self Care | Payer: Medicare PPO | Source: Ambulatory Visit | Attending: Cardiology | Admitting: Cardiology

## 2019-09-26 ENCOUNTER — Other Ambulatory Visit: Payer: Self-pay

## 2019-09-26 DIAGNOSIS — R0602 Shortness of breath: Secondary | ICD-10-CM | POA: Diagnosis not present

## 2019-09-26 LAB — EXERCISE TOLERANCE TEST
Estimated workload: 4.6 METS
Exercise duration (min): 2 min
Exercise duration (sec): 7 s
MPHR: 137 {beats}/min
Peak HR: 98 {beats}/min
Percent HR: 71 %
Rest HR: 61 {beats}/min

## 2019-09-30 ENCOUNTER — Ambulatory Visit (INDEPENDENT_AMBULATORY_CARE_PROVIDER_SITE_OTHER): Payer: Medicare PPO | Admitting: Cardiology

## 2019-09-30 ENCOUNTER — Encounter: Payer: Self-pay | Admitting: Cardiology

## 2019-09-30 ENCOUNTER — Other Ambulatory Visit: Payer: Self-pay

## 2019-09-30 VITALS — BP 122/70 | HR 56 | Temp 97.3°F | Ht 66.0 in | Wt 143.0 lb

## 2019-09-30 DIAGNOSIS — R0602 Shortness of breath: Secondary | ICD-10-CM

## 2019-09-30 DIAGNOSIS — E785 Hyperlipidemia, unspecified: Secondary | ICD-10-CM

## 2019-09-30 DIAGNOSIS — I350 Nonrheumatic aortic (valve) stenosis: Secondary | ICD-10-CM

## 2019-09-30 DIAGNOSIS — I739 Peripheral vascular disease, unspecified: Secondary | ICD-10-CM | POA: Diagnosis not present

## 2019-09-30 DIAGNOSIS — F17200 Nicotine dependence, unspecified, uncomplicated: Secondary | ICD-10-CM | POA: Diagnosis not present

## 2019-09-30 MED ORDER — CILOSTAZOL 50 MG PO TABS: 50.0000 mg | ORAL_TABLET | Freq: Two times a day (BID) | ORAL | 11 refills | Status: DC

## 2019-09-30 NOTE — Progress Notes (Signed)
Cardiology Office Note:    Date:  09/30/2019   ID:  Christopher Aid., DOB [birth year], MRN [id number]  PCP:  [name], MD  Cardiologist:  No primary care provider on file.  Electrophysiologist:  None   Referring MD: [name], MD   Chief Complaint  Patient presents with  . Follow-up    History of Present Illness:    Christopher Shin. is a 86 y.o. male with a hx of shortness of breath with exertion.  This was described by his friend who accompanied him.  The patient minimizes his symptoms, she says whenever he comes in from outside doing gardening he is significantly short of breath for a few minutes.  Patient denies chest pain or neck or arm pain.  On interview the patient says he did have a nuclear stress test in the past by Dr. Martinique that was apparently negative.  He saw Dr. Oval Linsey for further evaluation recently and was set up for an echocardiogram and stress test.  The echocardiogram done 09/04/2019 showed moderate aortic stenosis with normal LV function.  He did have a treadmill on 09/26/2019 but he failed to achieve his target heart rate, he was only able to exercise 2 minutes or so.  Test was stopped for shortness of breath and leg pain and weakness.  Dr. Oval Linsey suggested a nuclear stress to rule out ischemia as a cause of his dyspnea on exertion but the patient declines.  He has a general distrust of medicine.  He has been followed for vascular disease.  He saw Dr. Donnetta Hutching in the past.  At one point he was on cilostazol, this was changed to low-dose Xarelto 2.5 twice daily.  He does have weakness in both legs when walking but he says at this point its not lifestyle limiting.  He does continue to smoke.  Past Medical History:  Diagnosis Date  . Anemia   . Atrial fibrillation ([place])   . Basal cell carcinoma of skin, site unspecified   . Benign neoplasm of colon   . Contact dermatitis and other eczema, due to unspecified cause   . Diverticulosis of colon  (without mention of hemorrhage)   . Extrinsic asthma, unspecified   . Gout, unspecified   . Impotence of organic origin   . Other and unspecified hyperlipidemia   . Shortness of breath 08/19/2019  . Unspecified essential hypertension     Past Surgical History:  Procedure Laterality Date  . TONSILLECTOMY      Current Medications: No outpatient medications have been marked as taking for the 09/30/19 encounter (Office Visit) with Erlene Quan, PA-C.     Allergies:   Statins, Codeine, Neomycin-bacitracin zn-polymyx, Penicillins, and Prednisone   Social History   Socioeconomic History  . Marital status: Widowed    Spouse name: Not on file  . Number of children: 2  . Years of education: Not on file  . Highest education level: Not on file  Occupational History  . Occupation: retired Pharmacist, hospital  Tobacco Use  . Smoking status: Current Every Day Smoker    Packs/day: 0.50    Years: 60.00    Pack years: 30.00    Types: Cigarettes  . Smokeless tobacco: Never Used  . Tobacco comment: explained LDCT; smokes more since wife died;   Vaping Use  . Vaping Use: Never used  Substance and Sexual Activity  . Alcohol use: Yes    Alcohol/week: 10.0 standard drinks    Types: 10 Cans of  beer per week    Comment: Rarely drink anything  . Drug use: No  . Sexual activity: Not Currently  Other Topics Concern  . Not on file  Social History Narrative   HSG, [place], [place]; [employer]. Married '62. 2 daughters - '67, '70: no grandchildren. Retired-college Music therapist. Enjoys retirement: golf, gardening, woodworking. Marriage in good health            Social Determinants of Health   Financial Resource Strain:   . Difficulty of Paying Living Expenses:   Food Insecurity:   . Worried About Charity fundraiser in the Last Year:   . Arboriculturist in the Last Year:   Transportation Needs:   . Film/video editor (Medical):   Marland Kitchen Lack of Transportation  (Non-Medical):   Physical Activity: Insufficiently Active  . Days of Exercise per Week: 3 days  . Minutes of Exercise per Session: 40 min  Stress:   . Feeling of Stress :   Social Connections:   . Frequency of Communication with Friends and Family:   . Frequency of Social Gatherings with Friends and Family:   . Attends Religious Services:   . Active Member of Clubs or Organizations:   . Attends Archivist Meetings:   Marland Kitchen Marital Status:      Family History: The patient's family history includes Bladder Cancer in his brother; Cancer in his brother; Coronary artery disease (age of onset: 35) in his father; Heart attack in his father; Heart disease in his father; Hypertension in his mother; Stroke in his mother. There is no history of Diabetes.  ROS:   Please see the history of present illness.     All other systems reviewed and are negative.  EKGs/Labs/Other Studies Reviewed:    The following studies were reviewed today: Echo 09/04/2019  EKG:  EKG is not ordered today.  The ekg ordered 08/15/2019 demonstrates NSR, SB 48  OP monitor-09/13/2019 PVCs, nocturnal bradycardia, no bradycardia when up  Recent Labs: 08/15/2019: ALT 15; BUN 54; Creatinine, Ser 1.89; Hemoglobin 13.2; Platelets 230.0; Potassium 5.7 No hemolysis seen; Sodium 137  Recent Lipid Panel    Component Value Date/Time   CHOL 201 (H) 08/15/2019 0939   TRIG 313.0 (H) 08/15/2019 0939   HDL 30.20 (L) 08/15/2019 0939   CHOLHDL 7 08/15/2019 0939   VLDL 62.6 (H) 08/15/2019 0939   LDLCALC 111 (H) 06/13/2017 0904   LDLDIRECT 95.0 08/15/2019 0939    Physical Exam:    VS:  BP 122/70 (BP Location: Right Arm, Patient Position: Sitting, Cuff Size: Normal)   Pulse (!) 56   Temp (!) 97.3 F (36.3 C)   Ht 5\' 6"  (1.676 m)   Wt 143 lb (64.9 kg)   SpO2 98%   BMI 23.08 kg/m     Wt Readings from Last 3 Encounters:  09/30/19 143 lb (64.9 kg)  08/19/19 143 lb (64.9 kg)  08/15/19 144 lb 2 oz (65.4 kg)     GEN:  Well nourished, well developed in no acute distress HEENT: Normal NECK: No JVD; No carotid bruits CARDIAC: RRR, 2/6 systolic murmur, preserved S2,  rubs, gallops RESPIRATORY:  Scattered rhonchi, increased lung volume, no wheezing ABDOMEN: Soft, non-tender, non-distended, mid line abdominal bruit, bi femoral bruits MUSCULOSKELETAL:  No edema; No deformity- absent distal PT and DP pulses SKIN: Warm and dry,  NEUROLOGIC:  Alert and oriented x 3 PSYCHIATRIC:  Normal affect   ASSESSMENT:    DOE- Can't r/o  anginal equivalent without further testing (which he refuses) but I suspect his significant COPD is primarily contributing to his symptoms.  PVD- He has claudication and is on Xarelto 2.5 mg BID after he apparently failed Pletal.  He will contact Dr Donnetta Hutching for further evaluation if his claudication get worse.   CRI- 3b- ACE was stopped recently by his PCP  Moderate AS- Not contributing to his symptoms per Dr Oval Linsey.   PLAN:    Plan: I encouraged him to contact his PCP for further evaluation of his COPD.  I encouraged him to contact Dr Early if his claudication becomes lifestyle limiting.  He will contact us on a PRN basis for follow up.     Medication Adjustments/Labs and Tests Ordered: Current medicines are reviewed at length with the patient today.  Concerns regarding medicines are outlined above.  No orders of the defined types were placed in this encounter.  Meds ordered this encounter  Medications  . DISCONTD: cilostazol (PLETAL) 50 MG tablet    Sig: Take 1 tablet (50 mg total) by mouth 2 (two) times daily.    Dispense:  60 tablet    Refill:  11    Patient Instructions  Medication Instructions:  Your physician recommends that you continue on your current medications as directed. Please refer to the Current Medication list given to you today.  *If you need a refill on your cardiac medications before your next appointment, please call your pharmacy*   Follow-Up: At  [facility], you and your health needs are our priority.  As part of our continuing mission to provide you with exceptional heart care, we have created designated Provider Care Teams.  These Care Teams include your primary Cardiologist (physician) and Advanced Practice Providers (APPs -  Physician Assistants and Nurse Practitioners) who all work together to provide you with the care you need, when you need it.  We recommend signing up for the patient portal called "MyChart".  Sign up information is provided on this After Visit Summary.  MyChart is used to connect with patients for Virtual Visits (Telemedicine).  Patients are able to view lab/test results, encounter notes, upcoming appointments, etc.  Non-urgent messages can be sent to your provider as well.   To learn more about what you can do with MyChart, go to [email or web address].    Your next appointment:   As needed  The format for your next appointment:   In Person  Provider:   You may see Skeet Latch, MD or one of the following Advanced Practice Providers on your designated Care Team:    Kerin Ransom, [place]     Other Instructions Please call Dr. [name] about pulmonary testing. Please also call Dr. Donnetta Hutching about vascular testing.     Angelena Form, PA-C  09/30/2019 12:17 PM    [facility]

## 2019-09-30 NOTE — Patient Instructions (Signed)
Medication Instructions:  Your physician recommends that you continue on your current medications as directed. Please refer to the Current Medication list given to you today.  *If you need a refill on your cardiac medications before your next appointment, please call your pharmacy*   Follow-Up: At [facility], you and your health needs are our priority.  As part of our continuing mission to provide you with exceptional heart care, we have created designated Provider Care Teams.  These Care Teams include your primary Cardiologist (physician) and Advanced Practice Providers (APPs -  Physician Assistants and Nurse Practitioners) who all work together to provide you with the care you need, when you need it.  We recommend signing up for the patient portal called "MyChart".  Sign up information is provided on this After Visit Summary.  MyChart is used to connect with patients for Virtual Visits (Telemedicine).  Patients are able to view lab/test results, encounter notes, upcoming appointments, etc.  Non-urgent messages can be sent to your provider as well.   To learn more about what you can do with MyChart, go to [email or web address].    Your next appointment:   As needed  The format for your next appointment:   In Person  Provider:   You may see Skeet Latch, MD or one of the following Advanced Practice Providers on your designated Care Team:    Kerin Ransom, [place]     Other Instructions Please call Dr. [name] about pulmonary testing. Please also call Dr. Donnetta Hutching about vascular testing.

## 2019-10-17 ENCOUNTER — Other Ambulatory Visit: Payer: Self-pay | Admitting: Internal Medicine

## 2019-10-17 DIAGNOSIS — E785 Hyperlipidemia, unspecified: Secondary | ICD-10-CM

## 2019-10-17 DIAGNOSIS — I739 Peripheral vascular disease, unspecified: Secondary | ICD-10-CM

## 2019-10-31 ENCOUNTER — Telehealth: Payer: Self-pay

## 2019-11-04 NOTE — Telephone Encounter (Signed)
Unable to verify patient prescription eligibility. Mychart message sent to patient to verify the information that we have on file.

## 2019-11-26 DIAGNOSIS — L03113 Cellulitis of right upper limb: Secondary | ICD-10-CM | POA: Diagnosis not present

## 2019-12-03 ENCOUNTER — Ambulatory Visit (INDEPENDENT_AMBULATORY_CARE_PROVIDER_SITE_OTHER): Payer: Medicare PPO | Admitting: Internal Medicine

## 2019-12-03 ENCOUNTER — Encounter: Payer: Self-pay | Admitting: Internal Medicine

## 2019-12-03 ENCOUNTER — Other Ambulatory Visit: Payer: Self-pay

## 2019-12-03 VITALS — BP 154/78 | HR 61 | Temp 97.8°F | Resp 16 | Ht 66.0 in | Wt 145.0 lb

## 2019-12-03 DIAGNOSIS — N1831 Chronic kidney disease, stage 3a: Secondary | ICD-10-CM | POA: Diagnosis not present

## 2019-12-03 DIAGNOSIS — Z23 Encounter for immunization: Secondary | ICD-10-CM | POA: Diagnosis not present

## 2019-12-03 DIAGNOSIS — R001 Bradycardia, unspecified: Secondary | ICD-10-CM | POA: Diagnosis not present

## 2019-12-03 DIAGNOSIS — S51811A Laceration without foreign body of right forearm, initial encounter: Secondary | ICD-10-CM | POA: Diagnosis not present

## 2019-12-03 DIAGNOSIS — I10 Essential (primary) hypertension: Secondary | ICD-10-CM

## 2019-12-03 DIAGNOSIS — Z0000 Encounter for general adult medical examination without abnormal findings: Secondary | ICD-10-CM

## 2019-12-03 NOTE — Telephone Encounter (Signed)
Called the rx coverage for pt and started PA over the phone. The will send a fax with the determination.   REF: [id number]

## 2019-12-03 NOTE — Patient Instructions (Signed)

## 2019-12-03 NOTE — Progress Notes (Signed)
Subjective:  Patient ID: Christopher Rubio., male    DOB: [birth year]  Age: 86 y.o. MRN: [id number]  CC: Wound Check, Hypertension, Hyperlipidemia, and Annual Exam  This visit occurred during the SARS-CoV-2 public health emergency.  Safety protocols were in place, including screening questions prior to the visit, additional usage of staff PPE, and extensive cleaning of exam room while observing appropriate contact time as indicated for disinfecting solutions.      HPI [name]. presents for a CPX.  He describes developing a skin tear on the volar surface of his right mid forearm about 3 weeks ago.  It sounds like he eventually saw someone and had a flap removed.  He was prescribed cephalexin but it caused his skin to burn so he stopped taking it.  He eventually took a neighbor's supply of Cipro and says the area healed up. He continues to be concerned about the area.  It does not bother him he just says he can keep his mind off of it.  He tells me his blood pressure has been well controlled.  He denies any recent episodes of headache, blurred vision, chest pain, diaphoresis, dizziness, lightheadedness, or edema.  Outpatient Medications Prior to Visit  Medication Sig Dispense Refill   ASPIRIN LOW DOSE 81 MG EC tablet TAKE 1 TABLET(81 MG) BY MOUTH DAILY 90 tablet 1   Cholecalciferol (VITAMIN D) 2000 UNITS CAPS Take 2,000 Units by mouth daily.     colchicine 0.6 MG tablet Take 1 tablet (0.6 mg total) by mouth daily. (Patient taking differently: Take 0.6 mg by mouth as needed. ) 30 tablet 5   ezetimibe (ZETIA) 10 MG tablet TAKE 1 TABLET(10 MG) BY MOUTH DAILY 90 tablet 1   rivaroxaban (XARELTO) 2.5 MG TABS tablet Take 1 tablet (2.5 mg total) by mouth 2 (two) times daily. 180 tablet 1   silodosin (RAPAFLO) 4 MG CAPS capsule TAKE 1 CAPSULE(4 MG) BY MOUTH DAILY WITH BREAKFAST 90 capsule 1   tacrolimus (PROTOPIC) 0.1 % ointment      tadalafil (CIALIS) 5 MG tablet TAKE 1  TABLET(5 MG) BY MOUTH DAILY AS NEEDED FOR ERECTILE DYSFUNCTION 90 tablet 1   thiamine (VITAMIN B-1) 50 MG tablet Take 1 tablet (50 mg total) by mouth daily. 90 tablet 0   amLODipine (NORVASC) 5 MG tablet Take 1 tablet (5 mg total) by mouth daily. 90 tablet 1   No facility-administered medications prior to visit.    ROS Review of Systems  Constitutional: Negative for appetite change, chills, diaphoresis, fatigue and fever.  HENT: Negative.   Eyes: Negative for visual disturbance.  Respiratory: Negative for cough, chest tightness, shortness of breath and wheezing.   Cardiovascular: Negative for chest pain, palpitations and leg swelling.  Gastrointestinal: Negative for abdominal pain, constipation, diarrhea, nausea and vomiting.  Endocrine: Negative.   Genitourinary: Negative.  Negative for difficulty urinating.  Musculoskeletal: Negative for arthralgias and myalgias.  Skin: Positive for wound. Negative for color change.  Neurological: Negative.  Negative for dizziness, weakness, light-headedness and headaches.  Hematological: Negative for adenopathy. Does not bruise/bleed easily.  Psychiatric/Behavioral: Negative.     Objective:  BP (!) 154/78 (BP Location: Left Arm, Patient Position: Sitting, Cuff Size: Normal)    Pulse 61    Temp 97.8 F (36.6 C) (Oral)    Resp 16    Ht 5\' 6"  (1.676 m)    Wt 145 lb (65.8 kg)    SpO2 98%    BMI 23.40 kg/m  BP Readings from Last 3 Encounters:  12/03/19 (!) 154/78  09/30/19 122/70  08/19/19 (!) 170/80    Wt Readings from Last 3 Encounters:  12/03/19 145 lb (65.8 kg)  09/30/19 143 lb (64.9 kg)  08/19/19 143 lb (64.9 kg)    Physical Exam Vitals reviewed.  Constitutional:      Appearance: Normal appearance.  HENT:     Nose: Nose normal.     Mouth/Throat:     Mouth: Mucous membranes are moist.  Eyes:     General: No scleral icterus.    Conjunctiva/sclera: Conjunctivae normal.  Cardiovascular:     Rate and Rhythm: Normal rate and  regular rhythm.     Heart sounds: No murmur heard.   Pulmonary:     Effort: Pulmonary effort is normal.     Breath sounds: No stridor. No wheezing, rhonchi or rales.  Abdominal:     General: Abdomen is flat.     Palpations: There is no mass.     Tenderness: There is no abdominal tenderness. There is no guarding or rebound.  Musculoskeletal:        General: Normal range of motion.     Cervical back: Neck supple.     Left lower leg: No edema.     Comments: Volar surface of right mid forearm shows a healed skin tear.  There is faint granulation tissue but no tenderness, exudate, erythema, streaking, induration, or fluctuance.  Lymphadenopathy:     Cervical: No cervical adenopathy.  Skin:    General: Skin is warm and dry.     Coloration: Skin is not pale.     Findings: No erythema or rash.  Neurological:     General: No focal deficit present.     Mental Status: He is alert.  Psychiatric:        Mood and Affect: Mood normal.        Behavior: Behavior normal.     Lab Results  Component Value Date   WBC 8.8 08/15/2019   HGB 13.2 08/15/2019   HCT 40.2 08/15/2019   PLT 230.0 08/15/2019   GLUCOSE 104 (H) 08/15/2019   CHOL 201 (H) 08/15/2019   TRIG 313.0 (H) 08/15/2019   HDL 30.20 (L) 08/15/2019   LDLDIRECT 95.0 08/15/2019   LDLCALC 111 (H) 06/13/2017   ALT 15 08/15/2019   AST 15 08/15/2019   NA 137 08/15/2019   K 5.7 No hemolysis seen (H) 08/15/2019   CL 108 08/15/2019   CREATININE 1.89 (H) 08/15/2019   BUN 54 (H) 08/15/2019   CO2 25 08/15/2019   TSH 2.62 06/06/2016   PSA 2.40 12/11/2014    Exercise Tolerance Test  Result Date: 09/26/2019  Poor exercise capacity, achieved 4.6 METs  Hypertensive response to exercise, BP up to 208/81  Peak HR 98 bpm (71% MPHR)  No evidence of ischemia, but test is nondiagnostic as patient did not reach target heart rate     Assessment & Plan:   Antonis was seen today for wound check, hypertension, hyperlipidemia and annual  exam.  Diagnoses and all orders for this visit:  Essential hypertension- Considering his age and comorbid illnesses his blood pressure is adequately well controlled. -     BASIC METABOLIC PANEL WITH GFR; Future -     CBC with Differential/Platelet; Future -     VITAMIN D 25 Hydroxy (Vit-D Deficiency, Fractures); Future -     TSH; Future  Stage 3a chronic kidney disease- He will avoid nephrotoxic agents.  I have  asked him to stop smoking.  I will monitor his electrolytes and renal function. -     BASIC METABOLIC PANEL WITH GFR; Future -     PTH, intact and calcium; Future -     Phosphorus; Future -     CBC with Differential/Platelet; Future -     VITAMIN D 25 Hydroxy (Vit-D Deficiency, Fractures); Future  Bradycardia- His pulse is normal today and he is asymptomatic.  I will monitor him for hypothyroidism. -     TSH; Future  Routine health maintenance- Exam completed, labs ordered, vaccines reviewed and updated, no cancer screenings are indicated, patient education was given.  Need for pneumococcal vaccination -     Pneumococcal polysaccharide vaccine 23-valent greater than or equal to 2yo subcutaneous/IM  Skin tear of forearm without complication, right, initial encounter- I offered him reassurance that this has healed nicely with no complications.  If he continues to complain about this then I will treat him for neurodermatitis. -     Tdap vaccine greater than or equal to 7yo IM   I am having Esther L. Tollie Eth. maintain his Vitamin D, colchicine, thiamine, silodosin, Xarelto, tadalafil, amLODipine, Aspirin Low Dose, ezetimibe, and tacrolimus.  No orders of the defined types were placed in this encounter.  In addition to time spent on CPE, I spent 50 minutes in preparing to see the patient by review of recent labs, imaging and procedures, obtaining and reviewing separately obtained history, communicating with the patient and family or caregiver, ordering medications, tests or  procedures, and documenting clinical information in the EHR including the differential Dx, treatment, and any further evaluation and other management of 1. Essential hypertension 2. Stage 3a chronic kidney disease 3. Bradycardia 4. Skin tear of forearm without complication, right, initial encounter     Follow-up: Return in about 6 months (around 06/04/2020).  Scarlette Calico, MD

## 2019-12-18 ENCOUNTER — Other Ambulatory Visit: Payer: Self-pay | Admitting: Internal Medicine

## 2019-12-18 DIAGNOSIS — I739 Peripheral vascular disease, unspecified: Secondary | ICD-10-CM

## 2019-12-19 DIAGNOSIS — L853 Xerosis cutis: Secondary | ICD-10-CM | POA: Diagnosis not present

## 2019-12-19 DIAGNOSIS — L309 Dermatitis, unspecified: Secondary | ICD-10-CM | POA: Diagnosis not present

## 2020-01-27 ENCOUNTER — Other Ambulatory Visit: Payer: Self-pay | Admitting: Cardiovascular Disease

## 2020-01-27 ENCOUNTER — Other Ambulatory Visit: Payer: Self-pay | Admitting: Internal Medicine

## 2020-01-27 DIAGNOSIS — N401 Enlarged prostate with lower urinary tract symptoms: Secondary | ICD-10-CM

## 2020-01-27 DIAGNOSIS — N138 Other obstructive and reflux uropathy: Secondary | ICD-10-CM

## 2020-02-06 ENCOUNTER — Other Ambulatory Visit: Payer: Self-pay

## 2020-02-06 ENCOUNTER — Ambulatory Visit (INDEPENDENT_AMBULATORY_CARE_PROVIDER_SITE_OTHER): Payer: Medicare PPO

## 2020-02-06 DIAGNOSIS — Z23 Encounter for immunization: Secondary | ICD-10-CM

## 2020-02-06 DIAGNOSIS — Z0000 Encounter for general adult medical examination without abnormal findings: Secondary | ICD-10-CM | POA: Diagnosis not present

## 2020-02-06 NOTE — Patient Instructions (Signed)
Christopher Rubio , Thank you for taking time to come for your Medicare Wellness Visit. I appreciate your ongoing commitment to your health goals. Please review the following plan we discussed and let me know if I can assist you in the future.   Screening recommendations/referrals: Colonoscopy: 08/09/2013; no repeat due to age Recommended yearly ophthalmology/optometry visit for glaucoma screening and checkup Recommended yearly dental visit for hygiene and checkup  Vaccinations: Influenza vaccine: 02/06/2020 Pneumococcal vaccine: up to date Tdap vaccine: 12/03/2019 Shingles vaccine: up to date  Covid-19: up to date  Advanced directives: Please bring a copy of your health care power of attorney and living will to the office at your convenience.  Conditions/risks identified: Yes; Reviewed health maintenance screenings with patient today and relevant education, vaccines, and/or referrals were provided. Please continue to do your personal lifestyle choices by: daily care of teeth and gums, regular physical activity (goal should be 5 days a week for 30 minutes), eat a healthy diet, avoid tobacco and drug use, limiting any alcohol intake, taking a low-dose aspirin (if not allergic or have been advised by your provider otherwise) and taking vitamins and minerals as recommended by your provider. Continue doing brain stimulating activities (puzzles, reading, adult coloring books, staying active) to keep memory sharp. Continue to eat heart healthy diet (full of fruits, vegetables, whole grains, lean protein, water--limit salt, fat, and sugar intake) and increase physical activity as tolerated.  Next appointment: Please schedule your next Medicare Wellness Visit with your Nurse Health Advisor in 1 year by calling [phone].  Preventive Care 84 Years and Older, Male Preventive care refers to lifestyle choices and visits with your health care provider that can promote health and wellness. What does preventive  care include?  A yearly physical exam. This is also called an annual well check.  Dental exams once or twice a year.  Routine eye exams. Ask your health care provider how often you should have your eyes checked.  Personal lifestyle choices, including:  Daily care of your teeth and gums.  Regular physical activity.  Eating a healthy diet.  Avoiding tobacco and drug use.  Limiting alcohol use.  Practicing safe sex.  Taking low doses of aspirin every day.  Taking vitamin and mineral supplements as recommended by your health care provider. What happens during an annual well check? The services and screenings done by your health care provider during your annual well check will depend on your age, overall health, lifestyle risk factors, and family history of disease. Counseling  Your health care provider may ask you questions about your:  Alcohol use.  Tobacco use.  Drug use.  Emotional well-being.  Home and relationship well-being.  Sexual activity.  Eating habits.  History of falls.  Memory and ability to understand (cognition).  Work and work Statistician. Screening  You may have the following tests or measurements:  Height, weight, and BMI.  Blood pressure.  Lipid and cholesterol levels. These may be checked every 5 years, or more frequently if you are over 84 years old.  Skin check.  Lung cancer screening. You may have this screening every year starting at age 84 if you have a 30-pack-year history of smoking and currently smoke or have quit within the past 15 years.  Fecal occult blood test (FOBT) of the stool. You may have this test every year starting at age 84.  Flexible sigmoidoscopy or colonoscopy. You may have a sigmoidoscopy every 5 years or a colonoscopy every 10 years starting at age  84.  Prostate cancer screening. Recommendations will vary depending on your family history and other risks.  Hepatitis C blood test.  Hepatitis B blood  test.  Sexually transmitted disease (STD) testing.  Diabetes screening. This is done by checking your blood sugar (glucose) after you have not eaten for a while (fasting). You may have this done every 1-3 years.  Abdominal aortic aneurysm (AAA) screening. You may need this if you are a current or former smoker.  Osteoporosis. You may be screened starting at age 22 if you are at high risk. Talk with your health care provider about your test results, treatment options, and if necessary, the need for more tests. Vaccines  Your health care provider may recommend certain vaccines, such as:  Influenza vaccine. This is recommended every year.  Tetanus, diphtheria, and acellular pertussis (Tdap, Td) vaccine. You may need a Td booster every 10 years.  Zoster vaccine. You may need this after age 84.  Pneumococcal 13-valent conjugate (PCV13) vaccine. One dose is recommended after age 84.  Pneumococcal polysaccharide (PPSV23) vaccine. One dose is recommended after age 84. Talk to your health care provider about which screenings and vaccines you need and how often you need them. This information is not intended to replace advice given to you by your health care provider. Make sure you discuss any questions you have with your health care provider. Document Released: 04/24/2015 Document Revised: 12/16/2015 Document Reviewed: 01/27/2015 Elsevier Interactive Patient Education  2017 [place] Prevention in the Home Falls can cause injuries. They can happen to people of all ages. There are many things you can do to make your home safe and to help prevent falls. What can I do on the outside of my home?  Regularly fix the edges of walkways and driveways and fix any cracks.  Remove anything that might make you trip as you walk through a door, such as a raised step or threshold.  Trim any bushes or trees on the path to your home.  Use bright outdoor lighting.  Clear any walking paths of  anything that might make someone trip, such as rocks or tools.  Regularly check to see if handrails are loose or broken. Make sure that both sides of any steps have handrails.  Any raised decks and porches should have guardrails on the edges.  Have any leaves, snow, or ice cleared regularly.  Use sand or salt on walking paths during winter.  Clean up any spills in your garage right away. This includes oil or grease spills. What can I do in the bathroom?  Use night lights.  Install grab bars by the toilet and in the tub and shower. Do not use towel bars as grab bars.  Use non-skid mats or decals in the tub or shower.  If you need to sit down in the shower, use a plastic, non-slip stool.  Keep the floor dry. Clean up any water that spills on the floor as soon as it happens.  Remove soap buildup in the tub or shower regularly.  Attach bath mats securely with double-sided non-slip rug tape.  Do not have throw rugs and other things on the floor that can make you trip. What can I do in the bedroom?  Use night lights.  Make sure that you have a light by your bed that is easy to reach.  Do not use any sheets or blankets that are too big for your bed. They should not hang down onto the floor.  Have a firm chair that has side arms. You can use this for support while you get dressed.  Do not have throw rugs and other things on the floor that can make you trip. What can I do in the kitchen?  Clean up any spills right away.  Avoid walking on wet floors.  Keep items that you use a lot in easy-to-reach places.  If you need to reach something above you, use a strong step stool that has a grab bar.  Keep electrical cords out of the way.  Do not use floor polish or wax that makes floors slippery. If you must use wax, use non-skid floor wax.  Do not have throw rugs and other things on the floor that can make you trip. What can I do with my stairs?  Do not leave any items on the  stairs.  Make sure that there are handrails on both sides of the stairs and use them. Fix handrails that are broken or loose. Make sure that handrails are as long as the stairways.  Check any carpeting to make sure that it is firmly attached to the stairs. Fix any carpet that is loose or worn.  Avoid having throw rugs at the top or bottom of the stairs. If you do have throw rugs, attach them to the floor with carpet tape.  Make sure that you have a light switch at the top of the stairs and the bottom of the stairs. If you do not have them, ask someone to add them for you. What else can I do to help prevent falls?  Wear shoes that:  Do not have high heels.  Have rubber bottoms.  Are comfortable and fit you well.  Are closed at the toe. Do not wear sandals.  If you use a stepladder:  Make sure that it is fully opened. Do not climb a closed stepladder.  Make sure that both sides of the stepladder are locked into place.  Ask someone to hold it for you, if possible.  Clearly mark and make sure that you can see:  Any grab bars or handrails.  First and last steps.  Where the edge of each step is.  Use tools that help you move around (mobility aids) if they are needed. These include:  Canes.  Walkers.  Scooters.  Crutches.  Turn on the lights when you go into a dark area. Replace any light bulbs as soon as they burn out.  Set up your furniture so you have a clear path. Avoid moving your furniture around.  If any of your floors are uneven, fix them.  If there are any pets around you, be aware of where they are.  Review your medicines with your doctor. Some medicines can make you feel dizzy. This can increase your chance of falling. Ask your doctor what other things that you can do to help prevent falls. This information is not intended to replace advice given to you by your health care provider. Make sure you discuss any questions you have with your health care  provider. Document Released: 01/22/2009 Document Revised: 09/03/2015 Document Reviewed: 05/02/2014 Elsevier Interactive Patient Education  2017 [employer].

## 2020-02-06 NOTE — Progress Notes (Signed)
Subjective:   Lynden Flemmer. is a 86 y.o. male who presents for Medicare Annual/Subsequent preventive examination.  Review of Systems    No ROS. Medicare Wellness Visit. Cardiac Risk Factors include: advanced age (>64men, >69 women);dyslipidemia;family history of premature cardiovascular disease;hypertension;male gender     Objective:    Today's Vitals   02/06/20 0819  BP: 130/80  Pulse: (!) 49  Temp: 98.2 F (36.8 C)  SpO2: 98%  Weight: 145 lb (65.8 kg)  Height: 5\' 6"  (1.676 m)  PainSc: 0-No pain   Body mass index is 23.4 kg/m.  Advanced Directives 02/06/2020 02/05/2019 01/25/2018 01/19/2017 11/18/2015 02/17/2015 12/03/2014  Does Patient Have a Medical Advance Directive? Yes Yes Yes Yes Yes Yes Yes  Type of Advance Directive Living will;Healthcare Power of [place];Living will [place];Living will [place];Living will - [place];Living will -  Copy of [place] in Chart? No - copy requested No - copy requested No - copy requested No - copy requested - No - copy requested Yes  Would patient like information on creating a medical advance directive? - - - - Yes - Educational materials given - -    Current Medications (verified) Outpatient Encounter Medications as of 02/06/2020  Medication Sig  . amLODipine (NORVASC) 5 MG tablet TAKE 1 TABLET(5 MG) BY MOUTH DAILY  . ASPIRIN LOW DOSE 81 MG EC tablet TAKE 1 TABLET(81 MG) BY MOUTH DAILY  . Cholecalciferol (VITAMIN D) 2000 UNITS CAPS Take 2,000 Units by mouth daily.  . colchicine 0.6 MG tablet Take 1 tablet (0.6 mg total) by mouth daily. (Patient taking differently: Take 0.6 mg by mouth as needed. )  . ezetimibe (ZETIA) 10 MG tablet TAKE 1 TABLET(10 MG) BY MOUTH DAILY  . silodosin (RAPAFLO) 4 MG CAPS capsule TAKE 1 CAPSULE(4 MG) BY MOUTH DAILY WITH BREAKFAST  . tacrolimus (PROTOPIC) 0.1 % ointment   . tadalafil  (CIALIS) 5 MG tablet TAKE 1 TABLET(5 MG) BY MOUTH DAILY AS NEEDED FOR ERECTILE DYSFUNCTION  . thiamine (VITAMIN B-1) 50 MG tablet Take 1 tablet (50 mg total) by mouth daily.  Alveda Reasons 2.5 MG TABS tablet TAKE 1 TABLET(2.5 MG) BY MOUTH TWICE DAILY  . [DISCONTINUED] silodosin (RAPAFLO) 4 MG CAPS capsule Take 1 capsule (4 mg total) by mouth daily with breakfast.   No facility-administered encounter medications on file as of 02/06/2020.    Allergies (verified) Keflex [cephalexin], Statins, Codeine, Neomycin-bacitracin zn-polymyx, Penicillins, and Prednisone   History: Past Medical History:  Diagnosis Date  . Anemia   . Atrial fibrillation ([place])   . Basal cell carcinoma of skin, site unspecified   . Benign neoplasm of colon   . Contact dermatitis and other eczema, due to unspecified cause   . Diverticulosis of colon (without mention of hemorrhage)   . Extrinsic asthma, unspecified   . Gout, unspecified   . Impotence of organic origin   . Other and unspecified hyperlipidemia   . Shortness of breath 08/19/2019  . Unspecified essential hypertension    Past Surgical History:  Procedure Laterality Date  . TONSILLECTOMY     Family History  Problem Relation Age of Onset  . Coronary artery disease Father 39       deceased  . Heart attack Father   . Heart disease Father   . Stroke Mother   . Hypertension Mother   . Cancer Brother   . Bladder Cancer Brother   .  Diabetes Neg Hx    Social History   Socioeconomic History  . Marital status: Widowed    Spouse name: Not on file  . Number of children: 2  . Years of education: Not on file  . Highest education level: Not on file  Occupational History  . Occupation: retired Pharmacist, hospital  Tobacco Use  . Smoking status: Current Every Day Smoker    Packs/day: 0.50    Years: 60.00    Pack years: 30.00    Types: Cigarettes  . Smokeless tobacco: Never Used  . Tobacco comment: explained LDCT; smokes more since wife died;   Vaping Use  .  Vaping Use: Never used  Substance and Sexual Activity  . Alcohol use: Yes    Alcohol/week: 10.0 standard drinks    Types: 10 Cans of beer per week    Comment: Rarely drink anything  . Drug use: No  . Sexual activity: Not Currently  Other Topics Concern  . Not on file  Social History Narrative   HSG, [place], [place]; [employer]. Married '62. 2 daughters - '67, '70: no grandchildren. Retired-college Music therapist. Enjoys retirement: golf, gardening, woodworking. Marriage in good health            Social Determinants of Health   Financial Resource Strain: Low Risk   . Difficulty of Paying Living Expenses: Not hard at all  Food Insecurity: No Food Insecurity  . Worried About Charity fundraiser in the Last Year: Never true  . Ran Out of Food in the Last Year: Never true  Transportation Needs: No Transportation Needs  . Lack of Transportation (Medical): No  . Lack of Transportation (Non-Medical): No  Physical Activity: Sufficiently Active  . Days of Exercise per Week: 5 days  . Minutes of Exercise per Session: 30 min  Stress: No Stress Concern Present  . Feeling of Stress : Not at all  Social Connections: Moderately Integrated  . Frequency of Communication with Friends and Family: More than three times a week  . Frequency of Social Gatherings with Friends and Family: Once a week  . Attends Religious Services: More than 4 times per year  . Active Member of Clubs or Organizations: Yes  . Attends Archivist Meetings: More than 4 times per year  . Marital Status: Widowed    Tobacco Counseling Ready to quit: Not Answered Counseling given: Not Answered Comment: explained LDCT; smokes more since wife died;    Clinical Intake:  Pre-visit preparation completed: Yes  Pain : No/denies pain Pain Score: 0-No pain     BMI - recorded: 23.4 Nutritional Status: BMI of 19-24  Normal Nutritional Risks: None Diabetes: No  How often do you need  to have someone help you when you read instructions, pamphlets, or other written materials from your doctor or pharmacy?: 1 - Never What is the last grade level you completed in school?: Master's Degree in Education  Diabetic? no  Interpreter Needed?: No  Information entered by :: Arben Packman N. Kalib Bhagat, LPN   Activities of Daily Living In your present state of health, do you have any difficulty performing the following activities: 02/06/2020  Hearing? N  Vision? N  Difficulty concentrating or making decisions? N  Walking or climbing stairs? N  Dressing or bathing? N  Doing errands, shopping? N  Preparing Food and eating ? N  Using the Toilet? N  In the past six months, have you accidently leaked urine? N  Do you have problems with loss  of bowel control? N  Managing your Medications? N  Managing your Finances? N  Housekeeping or managing your Housekeeping? N  Some recent data might be hidden    Patient Care Team: [name], MD as PCP - General (Internal Medicine) [place], Arvilla Meres, MD as Consulting Physician (Vascular Surgery)  Indicate any recent Medical Services you may have received from other than [facility] providers in the past year (date may be approximate).     Assessment:   This is a routine wellness examination for [place].  Hearing/Vision screen No exam data present  Dietary issues and exercise activities discussed: Current Exercise Habits: Home exercise routine, Type of exercise: walking;Other - see comments (golfing, yard work, gardening, house work), Time (Minutes): 30, Frequency (Times/Week): 5, Weekly Exercise (Minutes/Week): 150, Intensity: Moderate, Exercise limited by: None identified  Goals    . Exercise 150 minutes per week (moderate activity)     Will start weight program at home Loss of muscle tone; recommended exercise with the 5 lb weights he has or can get exercise rubber bands or stretch bands      . patient     Will add some calories to diet in  the am Peanut butter; banana; cereal; blueberries  Gain weight Add honey; Eat nuts; (mixed) almonds or walnuts  Ice cream;  Protein bar       . Patient Stated    . Patient Stated      Stay active physically and socially. Continue to eat healthy and exercise.    . Stay as healthy and as independent as possible     Continue to exercise, eat healthy, and take care of me the best I can.     . will continue yoga     Will continue to embrace yoga and experiment with other forms of relaxation      Depression Screen PHQ 2/9 Scores 02/06/2020 08/15/2019 02/05/2019 01/25/2018 01/19/2017 11/18/2015 12/12/2014  PHQ - 2 Score 0 0 0 0 1 0 0  PHQ- 9 Score - - - - 2 - -    Fall Risk Fall Risk  02/06/2020 08/15/2019 02/05/2019 05/08/2018 01/25/2018  Falls in the past year? 0 0 0 0 No  Number falls in past yr: 0 0 0 0 -  Injury with Fall? 0 0 0 0 -  Risk for fall due to : No Fall Risks Impaired balance/gait - Impaired mobility -  Follow up Falls evaluation completed;Education provided Falls evaluation completed - Falls evaluation completed -    Any stairs in or around the home? No  If so, are there any without handrails? No  Home free of loose throw rugs in walkways, pet beds, electrical cords, etc? Yes  Adequate lighting in your home to reduce risk of falls? Yes   ASSISTIVE DEVICES UTILIZED TO PREVENT FALLS:  Life alert? No  Use of a cane, walker or w/c? No  Grab bars in the bathroom? No  Shower chair or bench in shower? No  Elevated toilet seat or a handicapped toilet? No   TIMED UP AND GO:  Was the test performed? No .  Length of time to ambulate 10 feet: 0 sec.   Gait steady and fast without use of assistive device  Cognitive Function: MMSE - Mini Mental State Exam 01/25/2018 01/19/2017 11/18/2015  Not completed: - - (No Data)  Orientation to time 5 5 -  Orientation to Place 5 5 -  Registration 3 3 -  Attention/ Calculation 5 5 -  Recall 2 2 -  Language- name 2 objects 2 2 -   Language- repeat 1 1 -  Language- follow 3 step command 3 3 -  Language- read & follow direction 1 1 -  Write a sentence 1 1 -  Copy design 1 1 -  Total score 29 29 -     6CIT Screen 02/06/2020 02/05/2019  What Year? 0 points 0 points  What month? 0 points 0 points  What time? 0 points 0 points  Count back from 20 0 points 0 points  Months in reverse 0 points 0 points  Repeat phrase 0 points 0 points  Total Score 0 0    Immunizations Immunization History  Administered Date(s) Administered  . Fluad Quad(high Dose 65+) 02/05/2019  . Influenza, High Dose Seasonal PF 12/11/2014, 01/26/2016, 01/19/2017, 01/25/2018  . Influenza,inj,Quad PF,6+ Mos 02/25/2013, 12/30/2013  . Moderna SARS-COVID-2 Vaccination 06/27/2019, 07/25/2019  . Pneumococcal Conjugate-13 03/13/2014  . Pneumococcal Polysaccharide-23 12/26/2012, 12/03/2019  . Td 08/10/2008  . Tdap 12/03/2019  . Zoster 02/13/2010    TDAP status: Up to date Flu Vaccine status: Up to date Pneumococcal vaccine status: Up to date Covid-19 vaccine status: Completed vaccines  Qualifies for Shingles Vaccine? Yes   Zostavax completed Yes   Shingrix Completed?: Yes  Screening Tests Health Maintenance  Topic Date Due  . INFLUENZA VACCINE  07/09/2020 (Originally 11/10/2019)  . TETANUS/TDAP  12/02/2029  . COVID-19 Vaccine  Completed  . PNA vac Low Risk Adult  Completed    Health Maintenance  There are no preventive care reminders to display for this patient.  Colorectal cancer screening: No longer required.   Lung Cancer Screening: (Low Dose CT Chest recommended if Age 35-80 years, 30 pack-year currently smoking OR have quit w/in 15years.) does not qualify.   Lung Cancer Screening Referral: no  Additional Screening:  Hepatitis C Screening: does not qualify; Completed no  Vision Screening: Recommended annual ophthalmology exams for early detection of glaucoma and other disorders of the eye. Is the patient up to date with  their annual eye exam?  No  Who is the provider or what is the name of the office in which the patient attends annual eye exams? No Provider/Patient Refused Referral If pt is not established with a provider, would they like to be referred to a provider to establish care? No .   Dental Screening: Recommended annual dental exams for proper oral hygiene  Community Resource Referral / Chronic Care Management: CRR required this visit?  No   CCM required this visit?  No      Plan:     I have personally reviewed and noted the following in the patient's chart:   . Medical and social history . Use of alcohol, tobacco or illicit drugs  . Current medications and supplements . Functional ability and status . Nutritional status . Physical activity . Advanced directives . List of other physicians . Hospitalizations, surgeries, and ER visits in previous 12 months . Vitals . Screenings to include cognitive, depression, and falls . Referrals and appointments  In addition, I have reviewed and discussed with patient certain preventive protocols, quality metrics, and best practice recommendations. A written personalized care plan for preventive services as well as general preventive health recommendations were provided to patient.     Sheral Flow, LPN   [id number]   Nurse Notes: n/a

## 2020-03-02 ENCOUNTER — Other Ambulatory Visit: Payer: Self-pay

## 2020-03-02 ENCOUNTER — Ambulatory Visit (INDEPENDENT_AMBULATORY_CARE_PROVIDER_SITE_OTHER)
Admission: RE | Admit: 2020-03-02 | Discharge: 2020-03-02 | Disposition: A | Discharge status: Home / Self Care | Payer: Medicare PPO | Source: Ambulatory Visit | Attending: Internal Medicine | Admitting: Internal Medicine

## 2020-03-02 ENCOUNTER — Encounter: Payer: Self-pay | Admitting: Internal Medicine

## 2020-03-02 ENCOUNTER — Ambulatory Visit (INDEPENDENT_AMBULATORY_CARE_PROVIDER_SITE_OTHER): Payer: Medicare PPO | Admitting: Internal Medicine

## 2020-03-02 VITALS — BP 118/68 | HR 56 | Temp 98.0°F | Resp 16 | Ht 66.0 in | Wt 144.0 lb

## 2020-03-02 DIAGNOSIS — M7989 Other specified soft tissue disorders: Secondary | ICD-10-CM | POA: Diagnosis not present

## 2020-03-02 DIAGNOSIS — M10371 Gout due to renal impairment, right ankle and foot: Secondary | ICD-10-CM | POA: Insufficient documentation

## 2020-03-02 DIAGNOSIS — I10 Essential (primary) hypertension: Secondary | ICD-10-CM

## 2020-03-02 DIAGNOSIS — N1831 Chronic kidney disease, stage 3a: Secondary | ICD-10-CM

## 2020-03-02 DIAGNOSIS — M79671 Pain in right foot: Secondary | ICD-10-CM

## 2020-03-02 MED ORDER — COLCHICINE 0.6 MG PO CAPS: 1.0000 | ORAL_CAPSULE | Freq: Two times a day (BID) | ORAL | 0 refills | Status: DC | PRN

## 2020-03-02 NOTE — Patient Instructions (Signed)
Gout  Gout is a condition that causes painful swelling of the joints. Gout is a type of inflammation of the joints (arthritis). This condition is caused by having too much uric acid in the body. Uric acid is a chemical that forms when the body breaks down substances called purines. Purines are important for building body proteins. When the body has too much uric acid, sharp crystals can form and build up inside the joints. This causes pain and swelling. Gout attacks can happen quickly and may be very painful (acute gout). Over time, the attacks can affect more joints and become more frequent (chronic gout). Gout can also cause uric acid to build up under the skin and inside the kidneys. What are the causes? This condition is caused by too much uric acid in your blood. This can happen because:  Your kidneys do not remove enough uric acid from your blood. This is the most common cause.  Your body makes too much uric acid. This can happen with some cancers and cancer treatments. It can also occur if your body is breaking down too many red blood cells (hemolytic anemia).  You eat too many foods that are high in purines. These foods include organ meats and some seafood. Alcohol, especially beer, is also high in purines. A gout attack may be triggered by trauma or stress. What increases the risk? You are more likely to develop this condition if you:  Have a family history of gout.  Are male and middle-aged.  Are male and have gone through menopause.  Are obese.  Frequently drink alcohol, especially beer.  Are dehydrated.  Lose weight too quickly.  Have an organ transplant.  Have lead poisoning.  Take certain medicines, including aspirin, cyclosporine, diuretics, levodopa, and niacin.  Have kidney disease.  Have a skin condition called psoriasis. What are the signs or symptoms? An attack of acute gout happens quickly. It usually occurs in just one joint. The most common place is  the big toe. Attacks often start at night. Other joints that may be affected include joints of the feet, ankle, knee, fingers, wrist, or elbow. Symptoms of this condition may include:  Severe pain.  Warmth.  Swelling.  Stiffness.  Tenderness. The affected joint may be very painful to touch.  Shiny, red, or purple skin.  Chills and fever. Chronic gout may cause symptoms more frequently. More joints may be involved. You may also have white or yellow lumps (tophi) on your hands or feet or in other areas near your joints. How is this diagnosed? This condition is diagnosed based on your symptoms, medical history, and physical exam. You may have tests, such as:  Blood tests to measure uric acid levels.  Removal of joint fluid with a thin needle (aspiration) to look for uric acid crystals.  X-rays to look for joint damage. How is this treated? Treatment for this condition has two phases: treating an acute attack and preventing future attacks. Acute gout treatment may include medicines to reduce pain and swelling, including:  NSAIDs.  Steroids. These are strong anti-inflammatory medicines that can be taken by mouth (orally) or injected into a joint.  Colchicine. This medicine relieves pain and swelling when it is taken soon after an attack. It can be given by mouth or through an IV. Preventive treatment may include:  Daily use of smaller doses of NSAIDs or colchicine.  Use of a medicine that reduces uric acid levels in your blood.  Changes to your diet. You may   need to see a dietitian about what to eat and drink to prevent gout. Follow these instructions at home: During a gout attack   If directed, put ice on the affected area: ? Put ice in a plastic bag. ? Place a towel between your skin and the bag. ? Leave the ice on for 20 minutes, 2-3 times a day.  Raise (elevate) the affected joint above the level of your heart as often as possible.  Rest the joint as much as possible.  If the affected joint is in your leg, you may be given crutches to use.  Follow instructions from your health care provider about eating or drinking restrictions. Avoiding future gout attacks  Follow a low-purine diet as told by your dietitian or health care provider. Avoid foods and drinks that are high in purines, including liver, kidney, anchovies, asparagus, herring, mushrooms, mussels, and beer.  Maintain a healthy weight or lose weight if you are overweight. If you want to lose weight, talk with your health care provider. It is important that you do not lose weight too quickly.  Start or maintain an exercise program as told by your health care provider. Eating and drinking  Drink enough fluids to keep your urine pale yellow.  If you drink alcohol: ? Limit how much you use to:  0-1 drink a day for women.  0-2 drinks a day for men. ? Be aware of how much alcohol is in your drink. In the [place] one drink equals one 12 oz bottle of beer (355 mL) one 5 oz glass of wine (148 mL), or one 1 oz glass of hard liquor (44 mL). General instructions  Take over-the-counter and prescription medicines only as told by your health care provider.  Do not drive or use heavy machinery while taking prescription pain medicine.  Return to your normal activities as told by your health care provider. Ask your health care provider what activities are safe for you.  Keep all follow-up visits as told by your health care provider. This is important. Contact a health care provider if you have:  Another gout attack.  Continuing symptoms of a gout attack after 10 days of treatment.  Side effects from your medicines.  Chills or a fever.  Burning pain when you urinate.  Pain in your lower back or belly. Get help right away if you:  Have severe or uncontrolled pain.  Cannot urinate. Summary  Gout is painful swelling of the joints caused by inflammation.  The most common site of pain is the big  toe, but it can affect other joints in the body.  Medicines and dietary changes can help to prevent and treat gout attacks. This information is not intended to replace advice given to you by your health care provider. Make sure you discuss any questions you have with your health care provider. Document Revised: 10/18/2017 Document Reviewed: 10/18/2017 Elsevier Patient Education  2020 [employer]  

## 2020-03-02 NOTE — Progress Notes (Signed)
Subjective:  Patient ID: Christopher Rubio., male    DOB: May 20, 1933  Age: 86 y.o. MRN: [id number]  CC: Foot Pain and Foot Swelling  This visit occurred during the SARS-CoV-2 public health emergency.  Safety protocols were in place, including screening questions prior to the visit, additional usage of staff PPE, and extensive cleaning of exam room while observing appropriate contact time as indicated for disinfecting solutions.    HPI Eileen Croswell. presents for the complaint of a 1 week hx of pain and swelling over the dorsum/distal right foot. There has not been a trauma or injury. He has gotten modest relief from the pain with motrin.  Outpatient Medications Prior to Visit  Medication Sig Dispense Refill  . amLODipine (NORVASC) 5 MG tablet TAKE 1 TABLET(5 MG) BY MOUTH DAILY 90 tablet 1  . ASPIRIN LOW DOSE 81 MG EC tablet TAKE 1 TABLET(81 MG) BY MOUTH DAILY 90 tablet 1  . Cholecalciferol (VITAMIN D) 2000 UNITS CAPS Take 2,000 Units by mouth daily.    Marland Kitchen ezetimibe (ZETIA) 10 MG tablet TAKE 1 TABLET(10 MG) BY MOUTH DAILY 90 tablet 1  . silodosin (RAPAFLO) 4 MG CAPS capsule TAKE 1 CAPSULE(4 MG) BY MOUTH DAILY WITH BREAKFAST 90 capsule 1  . tacrolimus (PROTOPIC) 0.1 % ointment     . tadalafil (CIALIS) 5 MG tablet TAKE 1 TABLET(5 MG) BY MOUTH DAILY AS NEEDED FOR ERECTILE DYSFUNCTION 90 tablet 1  . thiamine (VITAMIN B-1) 50 MG tablet Take 1 tablet (50 mg total) by mouth daily. 90 tablet 0  . XARELTO 2.5 MG TABS tablet TAKE 1 TABLET(2.5 MG) BY MOUTH TWICE DAILY 180 tablet 1  . colchicine 0.6 MG tablet Take 1 tablet (0.6 mg total) by mouth daily. (Patient taking differently: Take 0.6 mg by mouth as needed. ) 30 tablet 5   No facility-administered medications prior to visit.    ROS Review of Systems  Constitutional: Negative for chills, fatigue and fever.  HENT: Negative.   Eyes: Negative for visual disturbance.  Respiratory: Negative for cough, chest tightness, shortness of  breath and wheezing.   Cardiovascular: Negative for chest pain, palpitations and leg swelling.  Gastrointestinal: Negative for abdominal pain, constipation, diarrhea and nausea.  Endocrine: Negative.   Genitourinary: Negative.  Negative for difficulty urinating.  Musculoskeletal: Positive for arthralgias. Negative for back pain and myalgias.  Skin: Negative for color change and rash.  Neurological: Negative.  Negative for dizziness, weakness, light-headedness and headaches.  Hematological: Negative for adenopathy. Does not bruise/bleed easily.  Psychiatric/Behavioral: Negative.     Objective:  BP 118/68   Pulse (!) 56   Temp 98 F (36.7 C) (Oral)   Resp 16   Ht 5\' 6"  (1.676 m)   Wt 144 lb (65.3 kg)   SpO2 98%   BMI 23.24 kg/m   BP Readings from Last 3 Encounters:  03/02/20 118/68  02/06/20 130/80  12/03/19 (!) 154/78    Wt Readings from Last 3 Encounters:  03/02/20 144 lb (65.3 kg)  02/06/20 145 lb (65.8 kg)  12/03/19 145 lb (65.8 kg)    Physical Exam Vitals reviewed.  Constitutional:      General: He is not in acute distress.    Appearance: He is not ill-appearing, toxic-appearing or diaphoretic.  Cardiovascular:     Pulses:          Dorsalis pedis pulses are 1+ on the right side.       Posterior tibial pulses are 1+ on the right  side.  Musculoskeletal:       Legs:  Feet:     Right foot:     Skin integrity: Warmth present. No ulcer, blister, skin breakdown, erythema or dry skin.     Toenail Condition: Right toenails are normal.  Neurological:     Mental Status: He is alert.     Lab Results  Component Value Date   WBC 8.8 08/15/2019   HGB 13.2 08/15/2019   HCT 40.2 08/15/2019   PLT 230.0 08/15/2019   GLUCOSE 104 (H) 08/15/2019   CHOL 201 (H) 08/15/2019   TRIG 313.0 (H) 08/15/2019   HDL 30.20 (L) 08/15/2019   LDLDIRECT 95.0 08/15/2019   LDLCALC 111 (H) 06/13/2017   ALT 15 08/15/2019   AST 15 08/15/2019   NA 137 08/15/2019   K 5.7 No hemolysis  seen (H) 08/15/2019   CL 108 08/15/2019   CREATININE 1.89 (H) 08/15/2019   BUN 54 (H) 08/15/2019   CO2 25 08/15/2019   TSH 2.62 06/06/2016   PSA 2.40 12/11/2014    Exercise Tolerance Test  Result Date: 09/26/2019  Poor exercise capacity, achieved 4.6 METs  Hypertensive response to exercise, BP up to 208/81  Peak HR 98 bpm (71% MPHR)  No evidence of ischemia, but test is nondiagnostic as patient did not reach target heart rate    DG Foot Complete Right  Result Date: 03/02/2020 CLINICAL DATA:  Acute right foot pain and swelling without known injury. EXAM: RIGHT FOOT COMPLETE - 3+ VIEW COMPARISON:  None. FINDINGS: There is no evidence of fracture or dislocation. Erosion or lucency is seen involving the distal portion of the first metatarsal with adjacent soft tissue swelling consistent with gout. Soft tissues are unremarkable. IMPRESSION: Findings consistent with gout involving the distal portion of the first metatarsal. No acute fracture or dislocation is noted. Electronically Signed   By: Marijo Conception M.D.   On: 03/02/2020 16:26    Assessment & Plan:   Tremar was seen today for foot pain and foot swelling.  Diagnoses and all orders for this visit:  Primary hypertension -     Basic metabolic panel; Future -     TSH; Future  Stage 3a chronic kidney disease (HCC) -     Basic metabolic panel; Future  Swelling of right foot -     DG Foot Complete Right; Future  Acute foot pain, right- Based on his sxs, exam, hx, and xray. This is an acute flare of gout. He is not will to receive an course of steroids. Will treat with colchicine. -     DG Foot Complete Right; Future  Acute gout due to renal impairment involving right foot- See above. -     Colchicine (MITIGARE) 0.6 MG CAPS; Take 1 tablet by mouth 2 (two) times daily as needed.   I have changed Laiken L. Corey Jr.'s Colchicine. I am also having him maintain his Vitamin D, thiamine, tadalafil, Aspirin Low Dose, ezetimibe,  tacrolimus, Xarelto, silodosin, and amLODipine.  Meds ordered this encounter  Medications  . Colchicine (MITIGARE) 0.6 MG CAPS    Sig: Take 1 tablet by mouth 2 (two) times daily as needed.    Dispense:  180 capsule    Refill:  0     Follow-up: Return in about 3 months (around 06/02/2020).  Scarlette Calico, MD

## 2020-03-05 ENCOUNTER — Encounter: Payer: Self-pay | Admitting: Internal Medicine

## 2020-03-23 ENCOUNTER — Other Ambulatory Visit: Payer: Self-pay | Admitting: Internal Medicine

## 2020-03-23 DIAGNOSIS — I739 Peripheral vascular disease, unspecified: Secondary | ICD-10-CM

## 2020-03-23 DIAGNOSIS — E785 Hyperlipidemia, unspecified: Secondary | ICD-10-CM

## 2020-05-22 ENCOUNTER — Other Ambulatory Visit: Payer: Self-pay | Admitting: Internal Medicine

## 2020-05-22 DIAGNOSIS — N400 Enlarged prostate without lower urinary tract symptoms: Secondary | ICD-10-CM

## 2020-07-06 ENCOUNTER — Other Ambulatory Visit: Payer: Self-pay | Admitting: Internal Medicine

## 2020-07-06 DIAGNOSIS — I739 Peripheral vascular disease, unspecified: Secondary | ICD-10-CM

## 2020-07-21 ENCOUNTER — Other Ambulatory Visit: Payer: Self-pay | Admitting: Cardiovascular Disease

## 2020-09-19 ENCOUNTER — Other Ambulatory Visit: Payer: Self-pay | Admitting: Internal Medicine

## 2020-09-19 DIAGNOSIS — I739 Peripheral vascular disease, unspecified: Secondary | ICD-10-CM

## 2020-09-19 DIAGNOSIS — E785 Hyperlipidemia, unspecified: Secondary | ICD-10-CM

## 2020-09-21 DIAGNOSIS — Z85828 Personal history of other malignant neoplasm of skin: Secondary | ICD-10-CM | POA: Diagnosis not present

## 2020-09-21 DIAGNOSIS — L570 Actinic keratosis: Secondary | ICD-10-CM | POA: Diagnosis not present

## 2020-09-21 DIAGNOSIS — L720 Epidermal cyst: Secondary | ICD-10-CM | POA: Diagnosis not present

## 2020-09-21 DIAGNOSIS — L905 Scar conditions and fibrosis of skin: Secondary | ICD-10-CM | POA: Diagnosis not present

## 2020-09-21 DIAGNOSIS — L2084 Intrinsic (allergic) eczema: Secondary | ICD-10-CM | POA: Diagnosis not present

## 2020-09-21 DIAGNOSIS — L301 Dyshidrosis [pompholyx]: Secondary | ICD-10-CM | POA: Diagnosis not present

## 2020-10-02 ENCOUNTER — Other Ambulatory Visit: Payer: Self-pay

## 2020-10-02 ENCOUNTER — Ambulatory Visit: Payer: Medicare PPO | Admitting: Internal Medicine

## 2020-10-02 ENCOUNTER — Encounter: Payer: Self-pay | Admitting: Internal Medicine

## 2020-10-02 VITALS — BP 124/66 | HR 52 | Temp 98.3°F | Resp 18 | Ht 66.0 in | Wt 138.2 lb

## 2020-10-02 DIAGNOSIS — M109 Gout, unspecified: Secondary | ICD-10-CM | POA: Diagnosis not present

## 2020-10-02 DIAGNOSIS — I10 Essential (primary) hypertension: Secondary | ICD-10-CM

## 2020-10-02 DIAGNOSIS — F17200 Nicotine dependence, unspecified, uncomplicated: Secondary | ICD-10-CM

## 2020-10-02 NOTE — Progress Notes (Signed)
Patient ID: Christopher Aid., male   DOB: [birth year], 87 y.o.   MRN: [id number]        Chief Complaint: follow up sudden onset pain and swelling 3rd finger left hand       HPI:  Christopher Dungan. is a 87 y.o. male here with c/o 2 wks onset sudden pain, red swelling to PIP after working in the yard but cant recall hurting it.  Has hx of gout, just has always been in the feet and not in the hands before.  Did try colchicine bid has has at home and overall somewhat better from the start.  No fever, or other trauma, just bothers hm mostly that he cannot bend finger completely like he use to.  Pt states has an aversion to needles so will not take shots.  Also is adamant he has allergy to all steroid treatment so will not do this as well. Pt denies chest pain, increased sob or doe, wheezing, orthopnea, PND, increased LE swelling, palpitations, dizziness or syncope.   Pt denies polydipsia, polyuria, or new focal neuro s/s.       Wt Readings from Last 3 Encounters:  10/02/20 138 lb 3.2 oz (62.7 kg)  03/02/20 144 lb (65.3 kg)  02/06/20 145 lb (65.8 kg)   BP Readings from Last 3 Encounters:  10/02/20 124/66  03/02/20 118/68  02/06/20 130/80         Past Medical History:  Diagnosis Date   Anemia    Atrial fibrillation (HCC)    Basal cell carcinoma of skin, site unspecified    Benign neoplasm of colon    Contact dermatitis and other eczema, due to unspecified cause    Diverticulosis of colon (without mention of hemorrhage)    Extrinsic asthma, unspecified    Gout, unspecified    Impotence of organic origin    Other and unspecified hyperlipidemia    Shortness of breath 08/19/2019   Unspecified essential hypertension    Past Surgical History:  Procedure Laterality Date   TONSILLECTOMY      reports that he has been smoking cigarettes. He has a 30.00 pack-year smoking history. He has never used smokeless tobacco. He reports current alcohol use of about 10.0 standard drinks of alcohol  per week. He reports that he does not use drugs. family history includes Bladder Cancer in his brother; Cancer in his brother; Coronary artery disease (age of onset: 14) in his father; Heart attack in his father; Heart disease in his father; Hypertension in his mother; Stroke in his mother. Allergies  Allergen Reactions   Keflex [Cephalexin] Rash   Statins     myopathy   Codeine Nausea And Vomiting and Other (See Comments)    dizziness   Neomycin-Bacitracin Zn-Polymyx Other (See Comments)    Neosporin caused rash when covered with bandaid   Penicillins    Prednisone Other (See Comments)    Lips and tongue swelled up   Current Outpatient Medications on File Prior to Visit  Medication Sig Dispense Refill   amLODipine (NORVASC) 5 MG tablet TAKE 1 TABLET(5 MG) BY MOUTH DAILY 90 tablet 2   aspirin 81 MG EC tablet TAKE 1 TABLET(81 MG) BY MOUTH DAILY 90 tablet 1   Cholecalciferol (VITAMIN D) 2000 UNITS CAPS Take 2,000 Units by mouth daily.     Colchicine (MITIGARE) 0.6 MG CAPS Take 1 tablet by mouth 2 (two) times daily as needed. 180 capsule 0   ezetimibe (ZETIA) 10 MG tablet TAKE 1  TABLET(10 MG) BY MOUTH DAILY 90 tablet 1   silodosin (RAPAFLO) 4 MG CAPS capsule TAKE 1 CAPSULE(4 MG) BY MOUTH DAILY WITH BREAKFAST 90 capsule 1   tacrolimus (PROTOPIC) 0.1 % ointment      tadalafil (CIALIS) 5 MG tablet TAKE 1 TABLET(5 MG) BY MOUTH DAILY AS NEEDED FOR ERECTILE DYSFUNCTION 90 tablet 1   thiamine (VITAMIN B-1) 50 MG tablet Take 1 tablet (50 mg total) by mouth daily. 90 tablet 0   XARELTO 2.5 MG TABS tablet TAKE 1 TABLET(2.5 MG) BY MOUTH TWICE DAILY 180 tablet 1   No current facility-administered medications on file prior to visit.        ROS:  All others reviewed and negative.  Objective        PE:  BP 124/66   Pulse (!) 52   Temp 98.3 F (36.8 C) (Oral)   Resp 18   Ht 5\' 6"  (1.676 m)   Wt 138 lb 3.2 oz (62.7 kg)   SpO2 98%   BMI 22.31 kg/m                 Constitutional: Pt appears in  NAD               HENT: Head: NCAT.                Right Ear: External ear normal.                 Left Ear: External ear normal.                Eyes: . Pupils are equal, round, and reactive to light. Conjunctivae and EOM are normal               Nose: without d/c or deformity               Neck: Neck supple. Gross normal ROM               Cardiovascular: Normal rate and regular rhythm.                 Pulmonary/Chest: Effort normal and breath sounds without rales or wheezing.                Left hand 3rd finger DIP with mild to mod tender 2+ swelling and reduced ROM               Neurological: Pt is alert. At baseline orientation, motor grossly intact               Skin: Skin is warm. No rashes, no other new lesions, LE edema - none               Psychiatric: Pt behavior is normal without agitation   Micro: none  Cardiac tracings I have personally interpreted today:  none  Pertinent Radiological findings (summarize): none   Lab Results  Component Value Date   WBC 8.8 08/15/2019   HGB 13.2 08/15/2019   HCT 40.2 08/15/2019   PLT 230.0 08/15/2019   GLUCOSE 104 (H) 08/15/2019   CHOL 201 (H) 08/15/2019   TRIG 313.0 (H) 08/15/2019   HDL 30.20 (L) 08/15/2019   LDLDIRECT 95.0 08/15/2019   LDLCALC 111 (H) 06/13/2017   ALT 15 08/15/2019   AST 15 08/15/2019   NA 137 08/15/2019   K 5.7 No hemolysis seen (H) 08/15/2019   CL 108 08/15/2019   CREATININE 1.89 (H) 08/15/2019   BUN 54 (H) 08/15/2019  CO2 25 08/15/2019   TSH 2.62 06/06/2016   PSA 2.40 12/11/2014   Assessment/Plan:  Christopher Gough. is a 87 y.o. White or Caucasian [1] male with  has a past medical history of Anemia, Atrial fibrillation ([place]), Basal cell carcinoma of skin, site unspecified, Benign neoplasm of colon, Contact dermatitis and other eczema, due to unspecified cause, Diverticulosis of colon (without mention of hemorrhage), Extrinsic asthma, unspecified, Gout, unspecified, Impotence of organic origin, Other  and unspecified hyperlipidemia, Shortness of breath (08/19/2019), and Unspecified essential hypertension.  Acute gouty arthritis Exam most c/w this; declines any steroid tx or injection,  for increased colchicine to qid and ibuprofen qid prn,  to f/u any worsening symptoms or concerns  HTN (hypertension) BP Readings from Last 3 Encounters:  10/02/20 124/66  03/02/20 118/68  02/06/20 130/80   Stable, pt to continue medical treatment norvasc   Smoker Counseled to quit, pt not ready  Followup: No follow-ups on file.  Cathlean Cower, MD 10/03/2020 3:29 PM [facility]

## 2020-10-02 NOTE — Patient Instructions (Signed)
Ok to take the colchicine every 4 hrs until improved, as well as the ibuprofen 400 mg up to 4 times per day  Please consider seeing Dr Tamala Julian at the sports medicine on the first floor if not improved

## 2020-10-03 DIAGNOSIS — M109 Gout, unspecified: Secondary | ICD-10-CM | POA: Insufficient documentation

## 2020-10-03 NOTE — Assessment & Plan Note (Signed)
Counseled to quit, pt not ready 

## 2020-10-03 NOTE — Assessment & Plan Note (Signed)
BP Readings from Last 3 Encounters:  10/02/20 124/66  03/02/20 118/68  02/06/20 130/80   Stable, pt to continue medical treatment norvasc

## 2020-10-03 NOTE — Assessment & Plan Note (Signed)
Exam most c/w this; declines any steroid tx or injection,  for increased colchicine to qid and ibuprofen qid prn,  to f/u any worsening symptoms or concerns

## 2020-10-14 ENCOUNTER — Other Ambulatory Visit: Payer: Self-pay | Admitting: Internal Medicine

## 2020-10-14 DIAGNOSIS — I739 Peripheral vascular disease, unspecified: Secondary | ICD-10-CM

## 2020-11-23 DIAGNOSIS — L2084 Intrinsic (allergic) eczema: Secondary | ICD-10-CM | POA: Diagnosis not present

## 2020-11-23 DIAGNOSIS — C44319 Basal cell carcinoma of skin of other parts of face: Secondary | ICD-10-CM | POA: Diagnosis not present

## 2020-11-23 DIAGNOSIS — D485 Neoplasm of uncertain behavior of skin: Secondary | ICD-10-CM | POA: Diagnosis not present

## 2020-12-07 DIAGNOSIS — L209 Atopic dermatitis, unspecified: Secondary | ICD-10-CM | POA: Diagnosis not present

## 2020-12-23 DIAGNOSIS — L309 Dermatitis, unspecified: Secondary | ICD-10-CM | POA: Diagnosis not present

## 2020-12-23 DIAGNOSIS — L259 Unspecified contact dermatitis, unspecified cause: Secondary | ICD-10-CM | POA: Diagnosis not present

## 2020-12-25 DIAGNOSIS — F17200 Nicotine dependence, unspecified, uncomplicated: Secondary | ICD-10-CM | POA: Diagnosis not present

## 2020-12-25 DIAGNOSIS — E785 Hyperlipidemia, unspecified: Secondary | ICD-10-CM | POA: Diagnosis not present

## 2020-12-25 DIAGNOSIS — Z7901 Long term (current) use of anticoagulants: Secondary | ICD-10-CM | POA: Diagnosis not present

## 2020-12-25 DIAGNOSIS — N400 Enlarged prostate without lower urinary tract symptoms: Secondary | ICD-10-CM | POA: Diagnosis not present

## 2020-12-25 DIAGNOSIS — I10 Essential (primary) hypertension: Secondary | ICD-10-CM | POA: Diagnosis not present

## 2020-12-25 DIAGNOSIS — I4891 Unspecified atrial fibrillation: Secondary | ICD-10-CM | POA: Diagnosis not present

## 2020-12-25 DIAGNOSIS — D6869 Other thrombophilia: Secondary | ICD-10-CM | POA: Diagnosis not present

## 2020-12-25 DIAGNOSIS — M109 Gout, unspecified: Secondary | ICD-10-CM | POA: Diagnosis not present

## 2020-12-25 DIAGNOSIS — I739 Peripheral vascular disease, unspecified: Secondary | ICD-10-CM | POA: Diagnosis not present

## 2020-12-29 ENCOUNTER — Ambulatory Visit: Payer: Medicare PPO | Admitting: Internal Medicine

## 2020-12-29 ENCOUNTER — Other Ambulatory Visit: Payer: Self-pay

## 2020-12-29 ENCOUNTER — Encounter: Payer: Self-pay | Admitting: Internal Medicine

## 2020-12-29 VITALS — BP 132/74 | HR 56 | Temp 97.6°F | Ht 66.0 in | Wt 137.0 lb

## 2020-12-29 DIAGNOSIS — E519 Thiamine deficiency, unspecified: Secondary | ICD-10-CM | POA: Diagnosis not present

## 2020-12-29 DIAGNOSIS — L2084 Intrinsic (allergic) eczema: Secondary | ICD-10-CM | POA: Insufficient documentation

## 2020-12-29 DIAGNOSIS — N1831 Chronic kidney disease, stage 3a: Secondary | ICD-10-CM | POA: Diagnosis not present

## 2020-12-29 DIAGNOSIS — L508 Other urticaria: Secondary | ICD-10-CM

## 2020-12-29 DIAGNOSIS — R001 Bradycardia, unspecified: Secondary | ICD-10-CM

## 2020-12-29 DIAGNOSIS — Z0000 Encounter for general adult medical examination without abnormal findings: Secondary | ICD-10-CM | POA: Diagnosis not present

## 2020-12-29 DIAGNOSIS — I10 Essential (primary) hypertension: Secondary | ICD-10-CM | POA: Diagnosis not present

## 2020-12-29 DIAGNOSIS — M1000 Idiopathic gout, unspecified site: Secondary | ICD-10-CM

## 2020-12-29 DIAGNOSIS — E781 Pure hyperglyceridemia: Secondary | ICD-10-CM

## 2020-12-29 DIAGNOSIS — E785 Hyperlipidemia, unspecified: Secondary | ICD-10-CM | POA: Diagnosis not present

## 2020-12-29 LAB — CBC WITH DIFFERENTIAL/PLATELET
Basophils Absolute: 0.1 10*3/uL (ref 0.0–0.1)
Basophils Relative: 0.9 % (ref 0.0–3.0)
Eosinophils Absolute: 1.6 10*3/uL — ABNORMAL HIGH (ref 0.0–0.7)
Eosinophils Relative: 14.8 % — ABNORMAL HIGH (ref 0.0–5.0)
HCT: 41.7 % (ref 39.0–52.0)
Hemoglobin: 13.8 g/dL (ref 13.0–17.0)
Lymphocytes Relative: 16.9 % (ref 12.0–46.0)
Lymphs Abs: 1.9 10*3/uL (ref 0.7–4.0)
MCHC: 33 g/dL (ref 30.0–36.0)
MCV: 89.4 fl (ref 78.0–100.0)
Monocytes Absolute: 1 10*3/uL (ref 0.1–1.0)
Monocytes Relative: 9.4 % (ref 3.0–12.0)
Neutro Abs: 6.4 10*3/uL (ref 1.4–7.7)
Neutrophils Relative %: 58 % (ref 43.0–77.0)
Platelets: 222 10*3/uL (ref 150.0–400.0)
RBC: 4.66 Mil/uL (ref 4.22–5.81)
RDW: 14.8 % (ref 11.5–15.5)
WBC: 11.1 10*3/uL — ABNORMAL HIGH (ref 4.0–10.5)

## 2020-12-29 LAB — LIPID PANEL
Cholesterol: 177 mg/dL (ref 0–200)
HDL: 37 mg/dL — ABNORMAL LOW (ref >39.00)
LDL Cholesterol: 112 mg/dL — ABNORMAL HIGH (ref 0–99)
NonHDL: 139.99
Total CHOL/HDL Ratio: 5
Triglycerides: 139 mg/dL (ref 0.0–149.0)
VLDL: 27.8 mg/dL (ref 0.0–40.0)

## 2020-12-29 LAB — TSH: TSH: 3.39 u[IU]/mL (ref 0.35–5.50)

## 2020-12-29 LAB — BASIC METABOLIC PANEL
BUN: 23 mg/dL (ref 6–23)
CO2: 27 mEq/L (ref 19–32)
Calcium: 9.7 mg/dL (ref 8.4–10.5)
Chloride: 106 mEq/L (ref 96–112)
Creatinine, Ser: 1.48 mg/dL (ref 0.40–1.50)
GFR: 43.05 mL/min — ABNORMAL LOW (ref >60.00)
Glucose, Bld: 99 mg/dL (ref 70–99)
Potassium: 4.9 mEq/L (ref 3.5–5.1)
Sodium: 140 mEq/L (ref 135–145)

## 2020-12-29 LAB — HEPATIC FUNCTION PANEL
ALT: 15 U/L (ref 0–53)
AST: 16 U/L (ref 0–37)
Albumin: 4.2 g/dL (ref 3.5–5.2)
Alkaline Phosphatase: 115 U/L (ref 39–117)
Bilirubin, Direct: 0.2 mg/dL (ref 0.0–0.3)
Total Bilirubin: 0.6 mg/dL (ref 0.2–1.2)
Total Protein: 7.2 g/dL (ref 6.0–8.3)

## 2020-12-29 LAB — URIC ACID: Uric Acid, Serum: 8.2 mg/dL — ABNORMAL HIGH (ref 4.0–7.8)

## 2020-12-29 MED ORDER — LEVOCETIRIZINE DIHYDROCHLORIDE 5 MG PO TABS: 5.0000 mg | ORAL_TABLET | Freq: Every evening | ORAL | 1 refills | Status: DC

## 2020-12-29 MED ORDER — MONTELUKAST SODIUM 10 MG PO TABS: 10.0000 mg | ORAL_TABLET | Freq: Every day | ORAL | 1 refills | Status: DC

## 2020-12-29 NOTE — Progress Notes (Signed)
Subjective:  Patient ID: Christopher Aid., male    DOB: 06-Jan-1934  Age: 87 y.o. MRN: [id number]  CC: Annual Exam, Rash, Hypertension, and Hyperlipidemia  This visit occurred during the SARS-CoV-2 public health emergency.  Safety protocols were in place, including screening questions prior to the visit, additional usage of staff PPE, and extensive cleaning of exam room while observing appropriate contact time as indicated for disinfecting solutions.    HPI Christopher Walle. presents for a CPX and f/up -  He developed dry, itchy, burning skin about a month ago.  He tells me he saw a dermatologist and has had a biopsy performed.  He is being treated with a topical agent.  He is not willing to take a course of steroids.  The rash is not improving.  He does not know what has triggered this.  He denies headache, fever, chills, cough, chest pain, shortness of breath, or edema.  Outpatient Medications Prior to Visit  Medication Sig Dispense Refill   amLODipine (NORVASC) 5 MG tablet TAKE 1 TABLET(5 MG) BY MOUTH DAILY 90 tablet 2   aspirin 81 MG EC tablet TAKE 1 TABLET(81 MG) BY MOUTH DAILY 90 tablet 1   Cholecalciferol (VITAMIN D) 2000 UNITS CAPS Take 2,000 Units by mouth daily.     Colchicine (MITIGARE) 0.6 MG CAPS Take 1 tablet by mouth 2 (two) times daily as needed. 180 capsule 0   ezetimibe (ZETIA) 10 MG tablet TAKE 1 TABLET(10 MG) BY MOUTH DAILY 90 tablet 1   silodosin (RAPAFLO) 4 MG CAPS capsule TAKE 1 CAPSULE(4 MG) BY MOUTH DAILY WITH BREAKFAST 90 capsule 1   tacrolimus (PROTOPIC) 0.1 % ointment      tadalafil (CIALIS) 5 MG tablet TAKE 1 TABLET(5 MG) BY MOUTH DAILY AS NEEDED FOR ERECTILE DYSFUNCTION 90 tablet 1   thiamine (VITAMIN B-1) 50 MG tablet Take 1 tablet (50 mg total) by mouth daily. 90 tablet 0   XARELTO 2.5 MG TABS tablet TAKE 1 TABLET(2.5 MG) BY MOUTH TWICE DAILY 180 tablet 1   No facility-administered medications prior to visit.    ROS Review of Systems   Constitutional:  Negative for chills, diaphoresis, fatigue and fever.  HENT: Negative.  Negative for sore throat.   Eyes: Negative.   Respiratory:  Negative for cough, chest tightness, shortness of breath and wheezing.   Cardiovascular:  Negative for chest pain, palpitations and leg swelling.  Gastrointestinal:  Negative for abdominal pain, diarrhea, nausea and vomiting.  Endocrine: Negative.   Genitourinary: Negative.  Negative for difficulty urinating, dysuria and hematuria.  Musculoskeletal: Negative.   Skin:  Positive for rash. Negative for pallor.  Neurological: Negative.  Negative for dizziness, weakness and light-headedness.  Hematological:  Negative for adenopathy. Does not bruise/bleed easily.  Psychiatric/Behavioral: Negative.     Objective:  BP 132/74 (BP Location: Left Arm, Patient Position: Sitting, Cuff Size: Large)   Pulse (!) 56   Temp 97.6 F (36.4 C) (Oral)   Ht 5\' 6"  (1.676 m)   Wt 137 lb (62.1 kg)   SpO2 97%   BMI 22.11 kg/m   BP Readings from Last 3 Encounters:  12/29/20 132/74  10/02/20 124/66  03/02/20 118/68    Wt Readings from Last 3 Encounters:  12/29/20 137 lb (62.1 kg)  10/02/20 138 lb 3.2 oz (62.7 kg)  03/02/20 144 lb (65.3 kg)    Physical Exam Vitals reviewed.  Constitutional:      Appearance: He is not ill-appearing.  HENT:     Nose: Nose normal.     Mouth/Throat:     Mouth: Mucous membranes are moist.  Eyes:     Conjunctiva/sclera: Conjunctivae normal.  Cardiovascular:     Rate and Rhythm: Normal rate and regular rhythm.     Heart sounds: Murmur heard.  Systolic murmur is present with a grade of 1/6.  No diastolic murmur is present.    No gallop.  Pulmonary:     Effort: Pulmonary effort is normal.     Breath sounds: No stridor. No wheezing, rhonchi or rales.  Abdominal:     General: Abdomen is flat.     Palpations: There is no mass.     Tenderness: There is no abdominal tenderness. There is no guarding.     Hernia: No  hernia is present.  Musculoskeletal:        General: Normal range of motion.     Cervical back: Neck supple.     Right lower leg: No edema.     Left lower leg: No edema.  Lymphadenopathy:     Cervical: No cervical adenopathy.  Skin:    General: Skin is warm and dry.     Findings: Rash present. No lesion.     Comments: Over his extremities there are coalesced areas of erythematous papules and macules with faint scale.  Over the proximal thighs there are round serpiginous lesions that look like targets.  There is no involvement of the mucous membranes, palms, or soles.  There is no induration, warmth, fluctuance, streaking, exudate, or vesicles. See photos.  Neurological:     General: No focal deficit present.     Mental Status: He is alert.  Psychiatric:        Mood and Affect: Mood normal.        Behavior: Behavior normal.    Lab Results  Component Value Date   WBC 11.1 (H) 12/29/2020   HGB 13.8 12/29/2020   HCT 41.7 12/29/2020   PLT 222.0 12/29/2020   GLUCOSE 99 12/29/2020   CHOL 177 12/29/2020   TRIG 139.0 12/29/2020   HDL 37.00 (L) 12/29/2020   LDLDIRECT 95.0 08/15/2019   LDLCALC 112 (H) 12/29/2020   ALT 15 12/29/2020   AST 16 12/29/2020   NA 140 12/29/2020   K 4.9 12/29/2020   CL 106 12/29/2020   CREATININE 1.48 12/29/2020   BUN 23 12/29/2020   CO2 27 12/29/2020   TSH 3.39 12/29/2020   PSA 2.40 12/11/2014    DG Foot Complete Right  Result Date: 03/02/2020 CLINICAL DATA:  Acute right foot pain and swelling without known injury. EXAM: RIGHT FOOT COMPLETE - 3+ VIEW COMPARISON:  None. FINDINGS: There is no evidence of fracture or dislocation. Erosion or lucency is seen involving the distal portion of the first metatarsal with adjacent soft tissue swelling consistent with gout. Soft tissues are unremarkable. IMPRESSION: Findings consistent with gout involving the distal portion of the first metatarsal. No acute fracture or dislocation is noted. Electronically Signed    By: Marijo Conception M.D.   On: 03/02/2020 16:26    Assessment & Plan:   Christopher Rubio was seen today for annual exam, rash, hypertension and hyperlipidemia.  Diagnoses and all orders for this visit:  Primary hypertension- His blood pressure is adequately well controlled. -     Basic metabolic panel; Future -     TSH; Future -     TSH -     Basic metabolic panel  Stage 3a chronic  kidney disease ([place])- His renal function is stable. -     Basic metabolic panel; Future -     Basic metabolic panel  Idiopathic gout, unspecified chronicity, unspecified site- His uric acid level is elevated but he is not having any gout attacks. -     Uric acid; Future -     Uric acid  Hyperlipidemia with target LDL less than 70- He is not willing to take a statin -     Lipid panel; Future -     TSH; Future -     Hepatic function panel; Future -     Hepatic function panel -     TSH -     Lipid panel  Hypertriglyceridemia- trigs are normal now. -     Lipid panel; Future -     Lipid panel  Routine health maintenance- Exam completed, labs reviewed, vaccines reviewed and updated, no cancer screenings indicated, patient education was given.  Thiamine deficiency- Improvement noted. -     CBC with Differential/Platelet; Future -     Vitamin B1; Future -     Vitamin B1 -     CBC with Differential/Platelet  Bradycardia- He is tolerating this well.  Work-up for secondary causes is unremarkable. -     TSH; Future -     TSH  Intrinsic eczema- He was not willing to do a course of systemic steroids.  I recommended that he add an antihistamine and leukotriene inhibitor to his current regimen. -     levocetirizine (XYZAL) 5 MG tablet; Take 1 tablet (5 mg total) by mouth every evening. -     montelukast (SINGULAIR) 10 MG tablet; Take 1 tablet (10 mg total) by mouth at bedtime. -     Ambulatory referral to Allergy  Urticaria, chronic- See above. -     levocetirizine (XYZAL) 5 MG tablet; Take 1 tablet (5 mg  total) by mouth every evening. -     montelukast (SINGULAIR) 10 MG tablet; Take 1 tablet (10 mg total) by mouth at bedtime. -     Ambulatory referral to Allergy  I am having Monique L. Tollie Eth. start on levocetirizine and montelukast. I am also having him maintain his Vitamin D, thiamine, tacrolimus, silodosin, Colchicine, aspirin, tadalafil, amLODipine, ezetimibe, and Xarelto.  Meds ordered this encounter  Medications   levocetirizine (XYZAL) 5 MG tablet    Sig: Take 1 tablet (5 mg total) by mouth every evening.    Dispense:  90 tablet    Refill:  1   montelukast (SINGULAIR) 10 MG tablet    Sig: Take 1 tablet (10 mg total) by mouth at bedtime.    Dispense:  90 tablet    Refill:  1      Follow-up: Return in about 3 months (around 03/30/2021).  Scarlette Calico, MD

## 2020-12-29 NOTE — Patient Instructions (Signed)
Atopic Dermatitis Atopic dermatitis is a skin disorder that causes inflammation of the skin. It is marked by a red rash and itchy, dry, scaly skin. It is the most common type of eczema. Eczema is a group of skin conditions that cause the skin to become rough and swollen. This condition is generally worse during the cooler wintermonths and often improves during the warm summer months. Atopic dermatitis usually starts showing signs in infancy and can last through adulthood. This condition cannot be passed from one person to another (is not contagious). Atopic dermatitis may not always be present, but when it is, it is called aflare-up. What are the causes? The exact cause of this condition is not known. Flare-ups may be triggered by: Coming in contact with something that you are sensitive or allergic to (allergen). Stress. Certain foods. Extremely hot or cold weather. Harsh chemicals and soaps. Dry air. Chlorine. What increases the risk? This condition is more likely to develop in people who have a personal or family history of: Eczema. Allergies. Asthma. Hay fever. What are the signs or symptoms? Symptoms of this condition include: Dry, scaly skin. Red, itchy rash. Itchiness, which can be severe. This may occur before the skin rash. This can make sleeping difficult. Skin thickening and cracking that can occur over time. How is this diagnosed? This condition is diagnosed based on: Your symptoms. Your medical history. A physical exam. How is this treated? There is no cure for this condition, but symptoms can usually be controlled. Treatment focuses on: Controlling the itchiness and scratching. You may be given medicines, such as antihistamines or steroid creams. Limiting exposure to allergens. Recognizing situations that cause stress and developing a plan to manage stress. If your atopic dermatitis does not get better with medicines, or if it is all over your body (widespread), a  treatment using a specific type of light (phototherapy) may be used. Follow these instructions at home: Skin care  Keep your skin well moisturized. Doing this seals in moisture and helps to prevent dryness. Use unscented lotions that have petroleum in them. Avoid lotions that contain alcohol or water. They can dry the skin. Keep baths or showers short (less than 5 minutes) in warm water. Do not use hot water. Use mild, unscented cleansers for bathing. Avoid soap and bubble bath. Apply a moisturizer to your skin right after a bath or shower. Do not apply anything to your skin without checking with your health care provider.  General instructions Take or apply over-the-counter and prescription medicines only as told by your health care provider. Dress in clothes made of cotton or cotton blends. Dress lightly because heat increases itchiness. When washing your clothes, rinse your clothes twice so all of the soap is removed. Avoid any triggers that can cause a flare-up. Keep your fingernails cut short. Avoid scratching. Scratching makes the rash and itchiness worse. A break in the skin from scratching could result in a skin infection (impetigo). Do not be around people who have cold sores or fever blisters. If you get the infection, it may cause your atopic dermatitis to worsen. Keep all follow-up visits. This is important. Contact a health care provider if: Your itchiness interferes with sleep. Your rash gets worse or is not better within one week of starting treatment. You have a fever. You have a rash flare-up after having contact with someone who has cold sores or fever blisters. Get help right away if: You develop pus or soft yellow scabs in the rash area.   Summary Atopic dermatitis causes a red rash and itchy, dry, scaly skin. Treatment focuses on controlling the itchiness and scratching, limiting exposure to things that you are sensitive or allergic to (allergens), recognizing  situations that cause stress, and developing a plan to manage stress. Keep your skin well moisturized. Keep baths or showers shorter than 5 minutes and use warm water. Do not use hot water. This information is not intended to replace advice given to you by your health care provider. Make sure you discuss any questions you have with your healthcare provider. Document Revised: 01/06/2020 Document Reviewed: 01/06/2020 [employer] Patient Education  2022 [employer]  

## 2021-01-02 LAB — VITAMIN B1: Vitamin B1 (Thiamine): 31 nmol/L — ABNORMAL HIGH (ref 8–30)

## 2021-01-06 DIAGNOSIS — L209 Atopic dermatitis, unspecified: Secondary | ICD-10-CM | POA: Diagnosis not present

## 2021-01-18 DIAGNOSIS — C44319 Basal cell carcinoma of skin of other parts of face: Secondary | ICD-10-CM | POA: Diagnosis not present

## 2021-01-27 ENCOUNTER — Other Ambulatory Visit: Payer: Self-pay | Admitting: Internal Medicine

## 2021-01-27 DIAGNOSIS — I739 Peripheral vascular disease, unspecified: Secondary | ICD-10-CM

## 2021-02-01 DIAGNOSIS — C44319 Basal cell carcinoma of skin of other parts of face: Secondary | ICD-10-CM | POA: Diagnosis not present

## 2021-02-24 DIAGNOSIS — L570 Actinic keratosis: Secondary | ICD-10-CM | POA: Diagnosis not present

## 2021-02-24 DIAGNOSIS — D2261 Melanocytic nevi of right upper limb, including shoulder: Secondary | ICD-10-CM | POA: Diagnosis not present

## 2021-02-24 DIAGNOSIS — L309 Dermatitis, unspecified: Secondary | ICD-10-CM | POA: Diagnosis not present

## 2021-02-24 DIAGNOSIS — D485 Neoplasm of uncertain behavior of skin: Secondary | ICD-10-CM | POA: Diagnosis not present

## 2021-02-24 DIAGNOSIS — Z85828 Personal history of other malignant neoplasm of skin: Secondary | ICD-10-CM | POA: Diagnosis not present

## 2021-02-24 DIAGNOSIS — L814 Other melanin hyperpigmentation: Secondary | ICD-10-CM | POA: Diagnosis not present

## 2021-02-24 DIAGNOSIS — L821 Other seborrheic keratosis: Secondary | ICD-10-CM | POA: Diagnosis not present

## 2021-02-24 DIAGNOSIS — D225 Melanocytic nevi of trunk: Secondary | ICD-10-CM | POA: Diagnosis not present

## 2021-02-24 DIAGNOSIS — L218 Other seborrheic dermatitis: Secondary | ICD-10-CM | POA: Diagnosis not present

## 2021-02-24 DIAGNOSIS — Z08 Encounter for follow-up examination after completed treatment for malignant neoplasm: Secondary | ICD-10-CM | POA: Diagnosis not present

## 2021-02-28 ENCOUNTER — Other Ambulatory Visit: Payer: Self-pay | Admitting: Internal Medicine

## 2021-02-28 DIAGNOSIS — N138 Other obstructive and reflux uropathy: Secondary | ICD-10-CM

## 2021-02-28 DIAGNOSIS — I739 Peripheral vascular disease, unspecified: Secondary | ICD-10-CM

## 2021-02-28 DIAGNOSIS — E785 Hyperlipidemia, unspecified: Secondary | ICD-10-CM

## 2021-03-07 NOTE — Progress Notes (Signed)
New Patient Note  RE: Christopher Rubio. MRN: [id number] DOB: [birth year] Date of Office Visit: 03/08/2021  Consult requested by: [name], MD Primary care provider: [name], MD  Chief Complaint: Urticaria (On/off x 1 month)  History of Present Illness: I had the pleasure of seeing Saksham Akkerman for initial evaluation at the [facility] on 03/08/2021. He is a 87 y.o. male, who is referred here by [name], MD for the evaluation of chronic urticaria.  Patient has been having dry skin for the past 5 years and worse on the legs. About 2.5 months ago patient started to get rashes which started on his chest then spread to his arms and legs - sparing face, soles of his feet and palms of his hand.  He used tacrolimus ointment for this as he had some type of different rash over 30+ years ago - the ointment did not help. Patient had patch testing in the past which was positive to lanolin, fragrance, steroid and botanical per patient report. He is avoiding these items since then. This helped with his presumed athlete's foot rash.   Patient does complain of very dry skin on his legs and using Vanicream twice a day.   Describes the rash as dry, itchy, raised. This lasted for 1 month. No ecchymosis upon resolution.   Suspected triggers are unknown. Denies any fevers, chills, changes in medications, foods, personal care products or recent infections.  Using all free and clear laundry detergent. Not using any dryer sheets or fabric softeners.   He has tried the following therapies: tacrolimus, Singulair, Xyzal with no benefit. Systemic steroids no.  Previous work up includes: saw 2 different dermatologist and had skin biopsy which was inconclusive. Patient also had basal cell carcinoma on the face.  Previous history of rash/hives: yes. Patient is up to date with the following cancer screening tests: physical exam, colonoscopy.  Patient gets  itchy when doing gardenwork but is able to eat the vegetables that he is growing.  Blood pressure was elevated in the office - patient states he is very nervous as when he had skin testing done over 40 years ago it was very traumatizing for him and he does not want to get skin testing today.  Assessment and Plan: Mandel is a 87 y.o. male with: Pruritus Pruritic rash started 2.5 months ago. Previously had dry skin/rash and had patch testing which was positive to multiple items. Skin biopsy of rash was inconclusive per patient report. Interestingly had elevated eos of 1600 on 12/29/20. 65 year smoking history. Declines skin prick testing. Denies changes in diet, meds, personal care products around this time.  Not sure what's causing the itching/rash based on clinical history.  Start zyrtec (cetirizine) 10mg  once a day. If symptoms are not controlled or causes drowsiness let us know. Avoid the following potential triggers: alcohol, tight clothing, NSAIDs, hot showers and getting overheated. Get bloodwork to rule out other etiologies. Continue proper skin care - moisturize daily.  Allergic contact dermatitis See assessment and plan as above. Continue to avoid items that were positive on patch testing in the past.   Elevated blood pressure reading Patient is on medication for hypertension. He states he's nervous as he had a bad experience with skin testing in the past.  Monitor blood pressure and follow u with PCP if persistently elevated.   Return in about 4 weeks (around 04/05/2021).  No orders of the defined types were placed  in this encounter.  Lab Orders         Allergens w/Total IgE Area 2         Alpha-Gal Panel         ANA w/Reflex         Chronic Urticaria         Complement component c1q         C3 and C4         C-reactive protein         Sedimentation rate         Thyroid Cascade Profile         Tryptase         CBC with Differential/Platelet         Comprehensive  metabolic panel         Protein Electrophoresis, Urine Rflx.         Protein electrophoresis, serum      Other allergy screening: Asthma:  as a child but no inhaler use since high school. Rhino conjunctivitis: no Food allergy: no Medication allergy: yes Hymenoptera allergy: no History of recurrent infections suggestive of immunodeficency: no  Diagnostics: Skin Testing:  declines .  Past Medical History: Patient Active Problem List   Diagnosis Date Noted   Pruritus 03/08/2021   Rash and other nonspecific skin eruption 03/08/2021   Allergic contact dermatitis 03/08/2021   Elevated blood pressure reading 03/08/2021   Urticaria, chronic 12/29/2020   Intrinsic eczema 12/29/2020   Smoker 09/30/2019   Thiamine deficiency 08/15/2019   Aortic valve stenosis, moderate 08/15/2019   Bradycardia 08/15/2019   Hypertriglyceridemia 09/18/2018   Iliac artery stenosis, right ([place]) 05/17/2018   BPH with obstruction/lower urinary tract symptoms 05/08/2018   Kidney disease, chronic, stage III (GFR 30-59 ml/min) ([place]) 12/30/2013   SAH (subarachnoid hemorrhage) ([place]) 04/17/2013   HTN (hypertension) 04/07/2013   Routine health maintenance 10/15/2012   Claudication in peripheral vascular disease ([place]) 05/21/2011   Osteoarthritis of both feet 05/21/2011   Hyperlipidemia with target LDL less than 70 05/17/2010   Gout 05/17/2010   Past Medical History:  Diagnosis Date   Anemia    Atrial fibrillation ([place])    Basal cell carcinoma of skin, site unspecified    Benign neoplasm of colon    Contact dermatitis and other eczema, due to unspecified cause    Diverticulosis of colon (without mention of hemorrhage)    Extrinsic asthma, unspecified    Gout, unspecified    Impotence of organic origin    Other and unspecified hyperlipidemia    Shortness of breath 08/19/2019   Unspecified essential hypertension    Past Surgical History: Past Surgical History:  Procedure Laterality Date   TONSILLECTOMY      Medication List:  Current Outpatient Medications  Medication Sig Dispense Refill   amLODipine (NORVASC) 5 MG tablet TAKE 1 TABLET(5 MG) BY MOUTH DAILY 90 tablet 2   aspirin 81 MG EC tablet TAKE 1 TABLET(81 MG) BY MOUTH DAILY 90 tablet 1   Cholecalciferol (VITAMIN D) 2000 UNITS CAPS Take 2,000 Units by mouth daily.     Colchicine (MITIGARE) 0.6 MG CAPS Take 1 tablet by mouth 2 (two) times daily as needed. 180 capsule 0   ezetimibe (ZETIA) 10 MG tablet TAKE 1 TABLET(10 MG) BY MOUTH DAILY 90 tablet 1   silodosin (RAPAFLO) 4 MG CAPS capsule TAKE 1 CAPSULE(4 MG) BY MOUTH DAILY WITH BREAKFAST 90 capsule 1   tacrolimus (PROTOPIC) 0.1 % ointment      tadalafil (  CIALIS) 5 MG tablet TAKE 1 TABLET(5 MG) BY MOUTH DAILY AS NEEDED FOR ERECTILE DYSFUNCTION 90 tablet 1   thiamine (VITAMIN B-1) 50 MG tablet Take 1 tablet (50 mg total) by mouth daily. 90 tablet 0   XARELTO 2.5 MG TABS tablet TAKE 1 TABLET(2.5 MG) BY MOUTH TWICE DAILY 180 tablet 1   No current facility-administered medications for this visit.   Allergies: Allergies  Allergen Reactions   Keflex [Cephalexin] Rash   Statins     myopathy   Codeine Nausea And Vomiting and Other (See Comments)    dizziness   Neomycin-Bacitracin Zn-Polymyx Other (See Comments)    Neosporin caused rash when covered with bandaid   Penicillins    Prednisone Other (See Comments)    Lips and tongue swelled up   Social History: Social History   Socioeconomic History   Marital status: Widowed    Spouse name: Not on file   Number of children: 2   Years of education: Not on file   Highest education level: Not on file  Occupational History   Occupation: retired Pharmacist, hospital  Tobacco Use   Smoking status: Every Day    Packs/day: 0.50    Years: 60.00    Pack years: 30.00    Types: Cigarettes   Smokeless tobacco: Never   Tobacco comments:    explained LDCT; smokes more since wife died;   Vaping Use   Vaping Use: Never used  Substance and Sexual Activity    Alcohol use: Yes    Alcohol/week: 10.0 standard drinks    Types: 10 Cans of beer per week    Comment: Rarely drink anything   Drug use: No   Sexual activity: Not Currently  Other Topics Concern   Not on file  Social History Narrative   HSG, [place]; [employer]. Married '62. 2 daughters - '67, '70: no grandchildren. Retired-college Music therapist. Enjoys retirement: golf, gardening, woodworking. Marriage in good health            Social Determinants of Health   Financial Resource Strain: Not on file  Food Insecurity: Not on file  Transportation Needs: Not on file  Physical Activity: Not on file  Stress: Not on file  Social Connections: Not on file   Lives in a 85 year old house. Smoking: 1 pack per day x 65 years Occupation: retired  Programme researcher, broadcasting/film/video History: Environmental education officer in the house: no Charity fundraiser in the family room: no Carpet in the bedroom: no Heating: gas Cooling: central Pet: yes 2 cats  x 1-2 years  Family History: Family History  Problem Relation Age of Onset   Coronary artery disease Father 75       deceased   Heart attack Father    Heart disease Father    Stroke Mother    Hypertension Mother    Cancer Brother    Bladder Cancer Brother    Diabetes Neg Hx    Problem                               Relation Asthma                                   no Eczema  no Food allergy                          no Allergic rhino conjunctivitis     no  Review of Systems  Constitutional:  Negative for appetite change, chills, fever and unexpected weight change.  HENT:  Negative for congestion and rhinorrhea.   Eyes:  Negative for itching.  Respiratory:  Negative for cough, chest tightness, shortness of breath and wheezing.   Cardiovascular:  Negative for chest pain.  Gastrointestinal:  Negative for abdominal pain.  Genitourinary:  Negative for difficulty urinating.  Skin:  Positive for rash.   Neurological:  Negative for headaches.   Objective: BP (!) 170/72   Pulse (!) 53   Temp 97.6 F (36.4 C)   Resp 18   Ht 5\' 6"  (1.676 m)   Wt 140 lb 3.2 oz (63.6 kg)   SpO2 99%   BMI 22.63 kg/m  Body mass index is 22.63 kg/m. Physical Exam Vitals and nursing note reviewed.  Constitutional:      Appearance: Normal appearance. He is well-developed.  HENT:     Head: Normocephalic and atraumatic.     Right Ear: Tympanic membrane and external ear normal.     Left Ear: Tympanic membrane and external ear normal.     Nose: Nose normal.     Mouth/Throat:     Mouth: Mucous membranes are moist.     Pharynx: Oropharynx is clear.  Eyes:     Conjunctiva/sclera: Conjunctivae normal.  Cardiovascular:     Rate and Rhythm: Normal rate and regular rhythm.     Heart sounds: Normal heart sounds. No murmur heard.   No friction rub. No gallop.  Pulmonary:     Effort: Pulmonary effort is normal.     Breath sounds: Normal breath sounds. No wheezing, rhonchi or rales.  Musculoskeletal:     Cervical back: Neck supple.  Skin:    General: Skin is warm and dry.     Findings: Rash present.     Comments: Very frail and dry skin throughout. He has an erythematous hue with scattered areas of more intense redness on the lower extremities b/l.  Neurological:     Mental Status: He is alert and oriented to person, place, and time.  Psychiatric:        Behavior: Behavior normal.  The plan was reviewed with the patient/family, and all questions/concerned were addressed.  It was my pleasure to see Broadus today and participate in his care. Please feel free to contact me with any questions or concerns.  Sincerely,  Rexene Alberts, DO Allergy & Immunology  [facility] office: [place] office: [phone]

## 2021-03-08 ENCOUNTER — Other Ambulatory Visit: Payer: Self-pay

## 2021-03-08 ENCOUNTER — Encounter: Payer: Self-pay | Admitting: Allergy

## 2021-03-08 ENCOUNTER — Ambulatory Visit (INDEPENDENT_AMBULATORY_CARE_PROVIDER_SITE_OTHER): Payer: Medicare PPO | Admitting: Allergy

## 2021-03-08 VITALS — BP 170/72 | HR 53 | Temp 97.6°F | Resp 18 | Ht 66.0 in | Wt 140.2 lb

## 2021-03-08 DIAGNOSIS — R030 Elevated blood-pressure reading, without diagnosis of hypertension: Secondary | ICD-10-CM | POA: Diagnosis not present

## 2021-03-08 DIAGNOSIS — Z720 Tobacco use: Secondary | ICD-10-CM

## 2021-03-08 DIAGNOSIS — L239 Allergic contact dermatitis, unspecified cause: Secondary | ICD-10-CM | POA: Insufficient documentation

## 2021-03-08 DIAGNOSIS — L2989 Other pruritus: Secondary | ICD-10-CM | POA: Insufficient documentation

## 2021-03-08 DIAGNOSIS — R21 Rash and other nonspecific skin eruption: Secondary | ICD-10-CM | POA: Insufficient documentation

## 2021-03-08 DIAGNOSIS — L299 Pruritus, unspecified: Secondary | ICD-10-CM | POA: Diagnosis not present

## 2021-03-08 NOTE — Assessment & Plan Note (Signed)
Pruritic rash started 2.5 months ago. Previously had dry skin/rash and had patch testing which was positive to multiple items. Skin biopsy of rash was inconclusive per patient report. Interestingly had elevated eos of 1600 on 12/29/20. 65 year smoking history. Declines skin prick testing. Denies changes in diet, meds, personal care products around this time.   Not sure what's causing the itching/rash based on clinical history.   Start zyrtec (cetirizine) 10mg  once a day.  If symptoms are not controlled or causes drowsiness let us know. . Avoid the following potential triggers: alcohol, tight clothing, NSAIDs, hot showers and getting overheated. . Get bloodwork to rule out other etiologies. . Continue proper skin care - moisturize daily.

## 2021-03-08 NOTE — Patient Instructions (Addendum)
Rash/itching:  Not sure what's causing the itching/rash. Start zyrtec (cetirizine) 10mg  once a day. If symptoms are not controlled or causes drowsiness let us know. Avoid the following potential triggers: alcohol, tight clothing, NSAIDs, hot showers and getting overheated. Get bloodwork:  We are ordering labs, so please allow 1-2 weeks for the results to come back. With the newly implemented Cures Act, the labs might be visible to you at the same time that they become visible to me. However, I will not address the results until all of the results are back, so please be patient.    Follow up in 1 month or sooner if needed.  Skin care recommendations  Bath time: Always use lukewarm water. AVOID very hot or cold water. Keep bathing time to 5-10 minutes. Do NOT use bubble bath. Use a mild soap and use just enough to wash the dirty areas. Do NOT scrub skin vigorously.  After bathing, pat dry your skin with a towel. Do NOT rub or scrub the skin.  Moisturizers and prescriptions:  ALWAYS apply moisturizers immediately after bathing (within 3 minutes). This helps to lock-in moisture. Use the moisturizer several times a day over the whole body. Good summer moisturizers include: Aveeno, CeraVe, Cetaphil. Good winter moisturizers include: Aquaphor, Vaseline, Cerave, Cetaphil, Eucerin, Vanicream. When using moisturizers along with medications, the moisturizer should be applied about one hour after applying the medication to prevent diluting effect of the medication or moisturize around where you applied the medications. When not using medications, the moisturizer can be continued twice daily as maintenance.  Laundry and clothing: Avoid laundry products with added color or perfumes. Use unscented hypo-allergenic laundry products such as Tide free, Cheer free & gentle, and All free and clear.  If the skin still seems dry or sensitive, you can try double-rinsing the clothes. Avoid tight or scratchy  clothing such as wool. Do not use fabric softeners or dyer sheets.

## 2021-03-08 NOTE — Assessment & Plan Note (Signed)
.   See assessment and plan as above. . Continue to avoid items that were positive on patch testing in the past.

## 2021-03-08 NOTE — Assessment & Plan Note (Signed)
Patient is on medication for hypertension. He states he's nervous as he had a bad experience with skin testing in the past.   Monitor blood pressure and follow u with PCP if persistently elevated.

## 2021-03-16 LAB — ALLERGENS W/TOTAL IGE AREA 2
Alternaria Alternata IgE: <0.1 kU/L
Aspergillus Fumigatus IgE: <0.1 kU/L
Bermuda Grass IgE: <0.1 kU/L
Cat Dander IgE: 40.4 kU/L — AB
Cedar, Mountain IgE: 0.12 kU/L — AB
Cladosporium Herbarum IgE: 0.33 kU/L — AB
Cockroach, German IgE: <0.1 kU/L
Common Silver Birch IgE: <0.1 kU/L
Cottonwood IgE: <0.1 kU/L
D Farinae IgE: 2.67 kU/L — AB
D Pteronyssinus IgE: 1.68 kU/L — AB
Dog Dander IgE: 1.28 kU/L — AB
Elm, American IgE: <0.1 kU/L
Johnson Grass IgE: <0.1 kU/L
Maple/Box Elder IgE: <0.1 kU/L
Mouse Urine IgE: <0.1 kU/L
Oak, White IgE: <0.1 kU/L
Pecan, Hickory IgE: <0.1 kU/L
Penicillium Chrysogen IgE: <0.1 kU/L
Pigweed, Rough IgE: <0.1 kU/L
Ragweed, Short IgE: 0.3 kU/L — AB
Sheep Sorrel IgE Qn: <0.1 kU/L
Timothy Grass IgE: <0.1 kU/L
White Mulberry IgE: <0.1 kU/L

## 2021-03-16 LAB — CBC WITH DIFFERENTIAL/PLATELET
Basophils Absolute: 0.1 10*3/uL (ref 0.0–0.2)
Basos: 2 %
EOS (ABSOLUTE): 0.4 10*3/uL (ref 0.0–0.4)
Eos: 5 %
Hematocrit: 42.4 % (ref 37.5–51.0)
Hemoglobin: 14.3 g/dL (ref 13.0–17.7)
Immature Grans (Abs): 0 10*3/uL (ref 0.0–0.1)
Immature Granulocytes: 0 %
Lymphocytes Absolute: 1.8 10*3/uL (ref 0.7–3.1)
Lymphs: 21 %
MCH: 29.9 pg (ref 26.6–33.0)
MCHC: 33.7 g/dL (ref 31.5–35.7)
MCV: 89 fL (ref 79–97)
Monocytes Absolute: 1 10*3/uL — ABNORMAL HIGH (ref 0.1–0.9)
Monocytes: 12 %
Neutrophils Absolute: 5 10*3/uL (ref 1.4–7.0)
Neutrophils: 60 %
Platelets: 252 10*3/uL (ref 150–450)
RBC: 4.79 x10E6/uL (ref 4.14–5.80)
RDW: 13.8 % (ref 11.6–15.4)
WBC: 8.4 10*3/uL (ref 3.4–10.8)

## 2021-03-16 LAB — C-REACTIVE PROTEIN: CRP: 8 mg/L (ref 0–10)

## 2021-03-16 LAB — CHRONIC URTICARIA: cu index: 9.8 (ref <10)

## 2021-03-16 LAB — COMPREHENSIVE METABOLIC PANEL
ALT: 15 IU/L (ref 0–44)
AST: 17 IU/L (ref 0–40)
Albumin/Globulin Ratio: 2 (ref 1.2–2.2)
Albumin: 4.7 g/dL — ABNORMAL HIGH (ref 3.6–4.6)
Alkaline Phosphatase: 117 IU/L (ref 44–121)
BUN/Creatinine Ratio: 18 (ref 10–24)
BUN: 27 mg/dL (ref 8–27)
Bilirubin Total: 0.4 mg/dL (ref 0.0–1.2)
CO2: 21 mmol/L (ref 20–29)
Calcium: 9.5 mg/dL (ref 8.6–10.2)
Chloride: 103 mmol/L (ref 96–106)
Creatinine, Ser: 1.5 mg/dL — ABNORMAL HIGH (ref 0.76–1.27)
Globulin, Total: 2.4 g/dL (ref 1.5–4.5)
Glucose: 99 mg/dL (ref 70–99)
Potassium: 4.8 mmol/L (ref 3.5–5.2)
Sodium: 140 mmol/L (ref 134–144)
Total Protein: 7.1 g/dL (ref 6.0–8.5)
eGFR: 45 mL/min/{1.73_m2} — ABNORMAL LOW (ref >59)

## 2021-03-16 LAB — THYROID CASCADE PROFILE: TSH: 4.85 u[IU]/mL — ABNORMAL HIGH (ref 0.450–4.500)

## 2021-03-16 LAB — PROTEIN ELECTROPHORESIS, SERUM
A/G Ratio: 1.2 (ref 0.7–1.7)
Albumin ELP: 3.9 g/dL (ref 2.9–4.4)
Alpha 1: 0.3 g/dL (ref 0.0–0.4)
Alpha 2: 1 g/dL (ref 0.4–1.0)
Beta: 1 g/dL (ref 0.7–1.3)
Gamma Globulin: 0.9 g/dL (ref 0.4–1.8)
Globulin, Total: 3.2 g/dL (ref 2.2–3.9)

## 2021-03-16 LAB — ALPHA-GAL PANEL
Allergen Lamb IgE: <0.1 kU/L
Beef IgE: <0.1 kU/L
IgE (Immunoglobulin E), Serum: 1195 IU/mL — ABNORMAL HIGH (ref 6–495)
O215-IgE Alpha-Gal: <0.1 kU/L
Pork IgE: <0.1 kU/L

## 2021-03-16 LAB — THYROID PEROXIDASE (TPO) AB: Thyroid Peroxidase (TPO) Ab: <9 IU/mL (ref 0–34)

## 2021-03-16 LAB — ANA W/REFLEX: Anti Nuclear Antibody (ANA): NEGATIVE

## 2021-03-16 LAB — COMPLEMENT COMPONENT C1Q: Complement C1Q: 15.4 mg/dL (ref 10.2–20.3)

## 2021-03-16 LAB — PROTEIN ELECTROPHORESIS, URINE REFLEX
Albumin ELP, Urine: 84.8 %
Alpha-1-Globulin, U: 3.2 %
Alpha-2-Globulin, U: 2.7 %
Beta Globulin, U: 6.3 %
Gamma Globulin, U: 3 %
Protein, Ur: 46.4 mg/dL

## 2021-03-16 LAB — C3 AND C4
Complement C3, Serum: 142 mg/dL (ref 82–167)
Complement C4, Serum: 25 mg/dL (ref 12–38)

## 2021-03-16 LAB — SEDIMENTATION RATE: Sed Rate: 29 mm/hr (ref 0–30)

## 2021-03-16 LAB — THYROXINE (T4) FREE, DIRECT, S: T4,Free (Direct): 1.01 ng/dL (ref 0.82–1.77)

## 2021-03-16 LAB — TRYPTASE: Tryptase: 12.2 ug/L (ref 2.2–13.2)

## 2021-03-19 ENCOUNTER — Other Ambulatory Visit: Payer: Self-pay

## 2021-03-19 ENCOUNTER — Ambulatory Visit (INDEPENDENT_AMBULATORY_CARE_PROVIDER_SITE_OTHER): Payer: Medicare PPO

## 2021-03-19 VITALS — BP 150/60 | HR 51 | Temp 98.2°F | Ht 66.0 in | Wt 140.0 lb

## 2021-03-19 DIAGNOSIS — Z0000 Encounter for general adult medical examination without abnormal findings: Secondary | ICD-10-CM | POA: Diagnosis not present

## 2021-03-19 DIAGNOSIS — Z23 Encounter for immunization: Secondary | ICD-10-CM | POA: Diagnosis not present

## 2021-03-19 NOTE — Patient Instructions (Signed)
Christopher Rubio , Thank you for taking time to come for your Medicare Wellness Visit. I appreciate your ongoing commitment to your health goals. Please review the following plan we discussed and let me know if I can assist you in the future.   Screening recommendations/referrals: Colonoscopy: Not a candidate for screening due to age Recommended yearly ophthalmology/optometry visit for glaucoma screening and checkup Recommended yearly dental visit for hygiene and checkup  Vaccinations: Influenza vaccine: 03/19/2021 Pneumococcal vaccine: 03/13/2014, 12/03/2019 Tdap vaccine: 12/03/2019; due every 10 years Shingles vaccine: never done   Covid-19: 06/27/2019, 07/25/2019  Advanced directives: Please bring a copy of your health care power of attorney and living will to the office at your convenience.  Conditions/risks identified: Yes; Reviewed health maintenance screenings with patient today and relevant education, vaccines, and/or referrals were provided. Please continue to do your personal lifestyle choices by: daily care of teeth and gums, regular physical activity (goal should be 5 days a week for 30 minutes), eat a healthy diet, avoid tobacco and drug use, limiting any alcohol intake, taking a low-dose aspirin (if not allergic or have been advised by your provider otherwise) and taking vitamins and minerals as recommended by your provider. Continue doing brain stimulating activities (puzzles, reading, adult coloring books, staying active) to keep memory sharp. Continue to eat heart healthy diet (full of fruits, vegetables, whole grains, lean protein, water--limit salt, fat, and sugar intake) and increase physical activity as tolerated.  Next appointment: Please schedule your next Medicare Wellness Visit with your Nurse Health Advisor in 1 year by calling [phone].  Preventive Care 35 Years and Older, Male Preventive care refers to lifestyle choices and visits with your health care provider that can  promote health and wellness. What does preventive care include? A yearly physical exam. This is also called an annual well check. Dental exams once or twice a year. Routine eye exams. Ask your health care provider how often you should have your eyes checked. Personal lifestyle choices, including: Daily care of your teeth and gums. Regular physical activity. Eating a healthy diet. Avoiding tobacco and drug use. Limiting alcohol use. Practicing safe sex. Taking low doses of aspirin every day. Taking vitamin and mineral supplements as recommended by your health care provider. What happens during an annual well check? The services and screenings done by your health care provider during your annual well check will depend on your age, overall health, lifestyle risk factors, and family history of disease. Counseling  Your health care provider may ask you questions about your: Alcohol use. Tobacco use. Drug use. Emotional well-being. Home and relationship well-being. Sexual activity. Eating habits. History of falls. Memory and ability to understand (cognition). Work and work Statistician. Screening  You may have the following tests or measurements: Height, weight, and BMI. Blood pressure. Lipid and cholesterol levels. These may be checked every 5 years, or more frequently if you are over 68 years old. Skin check. Lung cancer screening. You may have this screening every year starting at age 106 if you have a 30-pack-year history of smoking and currently smoke or have quit within the past 15 years. Fecal occult blood test (FOBT) of the stool. You may have this test every year starting at age 39. Flexible sigmoidoscopy or colonoscopy. You may have a sigmoidoscopy every 5 years or a colonoscopy every 10 years starting at age 64. Prostate cancer screening. Recommendations will vary depending on your family history and other risks. Hepatitis C blood test. Hepatitis B blood test. Sexually  transmitted disease (STD) testing. Diabetes screening. This is done by checking your blood sugar (glucose) after you have not eaten for a while (fasting). You may have this done every 1-3 years. Abdominal aortic aneurysm (AAA) screening. You may need this if you are a current or former smoker. Osteoporosis. You may be screened starting at age 43 if you are at high risk. Talk with your health care provider about your test results, treatment options, and if necessary, the need for more tests. Vaccines  Your health care provider may recommend certain vaccines, such as: Influenza vaccine. This is recommended every year. Tetanus, diphtheria, and acellular pertussis (Tdap, Td) vaccine. You may need a Td booster every 10 years. Zoster vaccine. You may need this after age 105. Pneumococcal 13-valent conjugate (PCV13) vaccine. One dose is recommended after age 59. Pneumococcal polysaccharide (PPSV23) vaccine. One dose is recommended after age 80. Talk to your health care provider about which screenings and vaccines you need and how often you need them. This information is not intended to replace advice given to you by your health care provider. Make sure you discuss any questions you have with your health care provider. Document Released: 04/24/2015 Document Revised: 12/16/2015 Document Reviewed: 01/27/2015 Elsevier Interactive Patient Education  2017 [place] Prevention in the Home Falls can cause injuries. They can happen to people of all ages. There are many things you can do to make your home safe and to help prevent falls. What can I do on the outside of my home? Regularly fix the edges of walkways and driveways and fix any cracks. Remove anything that might make you trip as you walk through a door, such as a raised step or threshold. Trim any bushes or trees on the path to your home. Use bright outdoor lighting. Clear any walking paths of anything that might make someone trip, such as  rocks or tools. Regularly check to see if handrails are loose or broken. Make sure that both sides of any steps have handrails. Any raised decks and porches should have guardrails on the edges. Have any leaves, snow, or ice cleared regularly. Use sand or salt on walking paths during winter. Clean up any spills in your garage right away. This includes oil or grease spills. What can I do in the bathroom? Use night lights. Install grab bars by the toilet and in the tub and shower. Do not use towel bars as grab bars. Use non-skid mats or decals in the tub or shower. If you need to sit down in the shower, use a plastic, non-slip stool. Keep the floor dry. Clean up any water that spills on the floor as soon as it happens. Remove soap buildup in the tub or shower regularly. Attach bath mats securely with double-sided non-slip rug tape. Do not have throw rugs and other things on the floor that can make you trip. What can I do in the bedroom? Use night lights. Make sure that you have a light by your bed that is easy to reach. Do not use any sheets or blankets that are too big for your bed. They should not hang down onto the floor. Have a firm chair that has side arms. You can use this for support while you get dressed. Do not have throw rugs and other things on the floor that can make you trip. What can I do in the kitchen? Clean up any spills right away. Avoid walking on wet floors. Keep items that you use a  lot in easy-to-reach places. If you need to reach something above you, use a strong step stool that has a grab bar. Keep electrical cords out of the way. Do not use floor polish or wax that makes floors slippery. If you must use wax, use non-skid floor wax. Do not have throw rugs and other things on the floor that can make you trip. What can I do with my stairs? Do not leave any items on the stairs. Make sure that there are handrails on both sides of the stairs and use them. Fix handrails  that are broken or loose. Make sure that handrails are as long as the stairways. Check any carpeting to make sure that it is firmly attached to the stairs. Fix any carpet that is loose or worn. Avoid having throw rugs at the top or bottom of the stairs. If you do have throw rugs, attach them to the floor with carpet tape. Make sure that you have a light switch at the top of the stairs and the bottom of the stairs. If you do not have them, ask someone to add them for you. What else can I do to help prevent falls? Wear shoes that: Do not have high heels. Have rubber bottoms. Are comfortable and fit you well. Are closed at the toe. Do not wear sandals. If you use a stepladder: Make sure that it is fully opened. Do not climb a closed stepladder. Make sure that both sides of the stepladder are locked into place. Ask someone to hold it for you, if possible. Clearly mark and make sure that you can see: Any grab bars or handrails. First and last steps. Where the edge of each step is. Use tools that help you move around (mobility aids) if they are needed. These include: Canes. Walkers. Scooters. Crutches. Turn on the lights when you go into a dark area. Replace any light bulbs as soon as they burn out. Set up your furniture so you have a clear path. Avoid moving your furniture around. If any of your floors are uneven, fix them. If there are any pets around you, be aware of where they are. Review your medicines with your doctor. Some medicines can make you feel dizzy. This can increase your chance of falling. Ask your doctor what other things that you can do to help prevent falls. This information is not intended to replace advice given to you by your health care provider. Make sure you discuss any questions you have with your health care provider. Document Released: 01/22/2009 Document Revised: 09/03/2015 Document Reviewed: 05/02/2014 Elsevier Interactive Patient Education  2017 [employer].

## 2021-03-19 NOTE — Progress Notes (Signed)
Subjective:   Christopher Rubio. is a 87 y.o. male who presents for Medicare Annual/Subsequent preventive examination.  Review of Systems     Cardiac Risk Factors include: advanced age (>52men, >63 women);dyslipidemia;family history of premature cardiovascular disease;hypertension;male gender;smoking/ tobacco exposure     Objective:    Today's Vitals   03/19/21 1034  BP: (!) 150/60  Pulse: (!) 51  Temp: 98.2 F (36.8 C)  SpO2: 99%  Weight: 140 lb (63.5 kg)  Height: 5\' 6"  (1.676 m)  PainSc: 0-No pain   Body mass index is 22.6 kg/m.  Advanced Directives 03/19/2021 02/06/2020 02/05/2019 01/25/2018 01/19/2017 11/18/2015 02/17/2015  Does Patient Have a Medical Advance Directive? Yes Yes Yes Yes Yes Yes Yes  Type of Advance Directive Living will;Healthcare Power of Attorney Living will;Healthcare Power of [place];Living will [place];Living will [place];Living will - [place];Living will  Does patient want to make changes to medical advance directive? No - Patient declined - - - - - -  Copy of [place] in Chart? No - copy requested No - copy requested No - copy requested No - copy requested No - copy requested - No - copy requested  Would patient like information on creating a medical advance directive? - - - - - Yes - Educational materials given -    Current Medications (verified) Outpatient Encounter Medications as of 03/19/2021  Medication Sig   amLODipine (NORVASC) 5 MG tablet TAKE 1 TABLET(5 MG) BY MOUTH DAILY   aspirin 81 MG EC tablet TAKE 1 TABLET(81 MG) BY MOUTH DAILY   Cholecalciferol (VITAMIN D) 2000 UNITS CAPS Take 2,000 Units by mouth daily.   Colchicine (MITIGARE) 0.6 MG CAPS Take 1 tablet by mouth 2 (two) times daily as needed.   ezetimibe (ZETIA) 10 MG tablet TAKE 1 TABLET(10 MG) BY MOUTH DAILY   silodosin (RAPAFLO) 4 MG CAPS capsule TAKE 1 CAPSULE(4 MG)  BY MOUTH DAILY WITH BREAKFAST   tacrolimus (PROTOPIC) 0.1 % ointment    tadalafil (CIALIS) 5 MG tablet TAKE 1 TABLET(5 MG) BY MOUTH DAILY AS NEEDED FOR ERECTILE DYSFUNCTION   thiamine (VITAMIN B-1) 50 MG tablet Take 1 tablet (50 mg total) by mouth daily.   XARELTO 2.5 MG TABS tablet TAKE 1 TABLET(2.5 MG) BY MOUTH TWICE DAILY   No facility-administered encounter medications on file as of 03/19/2021.    Allergies (verified) Keflex [cephalexin], Statins, Codeine, Neomycin-bacitracin zn-polymyx, Penicillins, and Prednisone   History: Past Medical History:  Diagnosis Date   Anemia    Atrial fibrillation (HCC)    Basal cell carcinoma of skin, site unspecified    Benign neoplasm of colon    Contact dermatitis and other eczema, due to unspecified cause    Diverticulosis of colon (without mention of hemorrhage)    Extrinsic asthma, unspecified    Gout, unspecified    Impotence of organic origin    Other and unspecified hyperlipidemia    Shortness of breath 08/19/2019   Unspecified essential hypertension    Past Surgical History:  Procedure Laterality Date   TONSILLECTOMY     Family History  Problem Relation Age of Onset   Coronary artery disease Father 75       deceased   Heart attack Father    Heart disease Father    Stroke Mother    Hypertension Mother    Cancer Brother    Bladder Cancer Brother    Diabetes Neg Hx  Social History   Socioeconomic History   Marital status: Widowed    Spouse name: Not on file   Number of children: 2   Years of education: Not on file   Highest education level: Not on file  Occupational History   Occupation: retired Pharmacist, hospital  Tobacco Use   Smoking status: Every Day    Packs/day: 0.50    Years: 60.00    Pack years: 30.00    Types: Cigarettes   Smokeless tobacco: Never   Tobacco comments:    explained LDCT; smokes more since wife died;   Vaping Use   Vaping Use: Never used  Substance and Sexual Activity   Alcohol use: Yes     Alcohol/week: 10.0 standard drinks    Types: 10 Cans of beer per week    Comment: Rarely drink anything   Drug use: No   Sexual activity: Not Currently  Other Topics Concern   Not on file  Social History Narrative   HSG, [place]; [employer]. Married '62. 2 daughters - '67, '70: no grandchildren. Retired-college Music therapist. Enjoys retirement: golf, gardening, woodworking. Marriage in good health            Social Determinants of Health   Financial Resource Strain: Low Risk    Difficulty of Paying Living Expenses: Not hard at all  Food Insecurity: No Food Insecurity   Worried About Charity fundraiser in the Last Year: Never true   Arboriculturist in the Last Year: Never true  Transportation Needs: No Transportation Needs   Lack of Transportation (Medical): No   Lack of Transportation (Non-Medical): No  Physical Activity: Sufficiently Active   Days of Exercise per Week: 5 days   Minutes of Exercise per Session: 30 min  Stress: No Stress Concern Present   Feeling of Stress : Not at all  Social Connections: Moderately Integrated   Frequency of Communication with Friends and Family: More than three times a week   Frequency of Social Gatherings with Friends and Family: Once a week   Attends Religious Services: More than 4 times per year   Active Member of [employer] or Organizations: Yes   Attends Archivist Meetings: More than 4 times per year   Marital Status: Widowed    Tobacco Counseling Ready to quit: Not Answered Counseling given: Not Answered Tobacco comments: explained LDCT; smokes more since wife died;    Clinical Intake:  Pre-visit preparation completed: Yes  Pain : No/denies pain Pain Score: 0-No pain     BMI - recorded: 22.6 Nutritional Status: BMI of 19-24  Normal Nutritional Risks: None Diabetes: No  How often do you need to have someone help you when you read instructions, pamphlets, or other written  materials from your doctor or pharmacy?: 1 - Never 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 no  Interpreter Needed?: No  Information entered by :: Lisette Abu, LPN   Activities of Daily Living In your present state of health, do you have any difficulty performing the following activities: 03/19/2021  Hearing? N  Vision? N  Difficulty concentrating or making decisions? N  Walking or climbing stairs? N  Dressing or bathing? N  Doing errands, shopping? N  Preparing Food and eating ? N  Using the Toilet? N  In the past six months, have you accidently leaked urine? N  Do you have problems with loss of bowel control? N  Managing your Medications?  N  Managing your Finances? N  Housekeeping or managing your Housekeeping? N  Some recent data might be hidden    Patient Care Team: [name], MD as PCP - General (Internal Medicine) Early, Arvilla Meres, MD as Consulting Physician (Vascular Surgery)  Indicate any recent Medical Services you may have received from other than [facility] providers in the past year (date may be approximate).     Assessment:   This is a routine wellness examination for [place].  Hearing/Vision screen Hearing Screening - Comments:: Patient denied any hearing difficulty.   No hearing aids.  Vision Screening - Comments:: Patient wears otc readers. Patient refused eye exam.  Dietary issues and exercise activities discussed: Current Exercise Habits: Home exercise routine, Time (Minutes): 30, Frequency (Times/Week): 5, Weekly Exercise (Minutes/Week): 150, Intensity: Moderate, Exercise limited by: None identified   Goals Addressed               This Visit's Progress     Patient Stated (pt-stated)        Continue to stay independent, physically and socially involved.      Depression Screen PHQ 2/9 Scores 03/19/2021 02/06/2020 08/15/2019 02/05/2019 01/25/2018 01/19/2017 11/18/2015  PHQ - 2 Score 0 0 0 0 0 1 0   PHQ- 9 Score - - - - - 2 -    Fall Risk Fall Risk  03/19/2021 02/06/2020 08/15/2019 02/05/2019 05/08/2018  Falls in the past year? 0 0 0 0 0  Number falls in past yr: 0 0 0 0 0  Injury with Fall? 0 0 0 0 0  Risk for fall due to : No Fall Risks No Fall Risks Impaired balance/gait - Impaired mobility  Follow up Falls prevention discussed Falls evaluation completed;Education provided Falls evaluation completed - Falls evaluation completed    FALL RISK PREVENTION PERTAINING TO THE HOME:  Any stairs in or around the home? No  If so, are there any without handrails? No  Home free of loose throw rugs in walkways, pet beds, electrical cords, etc? Yes  Adequate lighting in your home to reduce risk of falls? Yes   ASSISTIVE DEVICES UTILIZED TO PREVENT FALLS:  Life alert? No  Use of a cane, walker or w/c? No  Grab bars in the bathroom? No  Shower chair or bench in shower? No  Elevated toilet seat or a handicapped toilet? No   TIMED UP AND GO:  Was the test performed? Yes .  Length of time to ambulate 10 feet: 6 sec.   Gait steady and fast without use of assistive device  Cognitive Function: Normal cognitive status assessed by direct observation by this Nurse Health Advisor. No abnormalities found.   MMSE - Mini Mental State Exam 01/25/2018 01/19/2017 11/18/2015  Not completed: - - (No Data)  Orientation to time 5 5 -  Orientation to Place 5 5 -  Registration 3 3 -  Attention/ Calculation 5 5 -  Recall 2 2 -  Language- name 2 objects 2 2 -  Language- repeat 1 1 -  Language- follow 3 step command 3 3 -  Language- read & follow direction 1 1 -  Write a sentence 1 1 -  Copy design 1 1 -  Total score 29 29 -     6CIT Screen 02/06/2020 02/05/2019  What Year? 0 points 0 points  What month? 0 points 0 points  What time? 0 points 0 points  Count back from 20 0 points 0 points  Months in reverse 0  points 0 points  Repeat phrase 0 points 0 points  Total Score 0 0     Immunizations Immunization History  Administered Date(s) Administered   Fluad Quad(high Dose 65+) 02/05/2019, 02/06/2020   Influenza, High Dose Seasonal PF 12/11/2014, 01/26/2016, 01/19/2017, 01/25/2018   Influenza,inj,Quad PF,6+ Mos 02/25/2013, 12/30/2013   Moderna Sars-Covid-2 Vaccination 06/27/2019, 07/25/2019   Pneumococcal Conjugate-13 03/13/2014   Pneumococcal Polysaccharide-23 12/26/2012, 12/03/2019   Td 08/10/2008   Tdap 12/03/2019   Zoster, Live 02/13/2010    TDAP status: Up to date  Flu Vaccine status: Completed at today's visit  Pneumococcal vaccine status: Up to date  Covid-19 vaccine status: Completed vaccines  Qualifies for Shingles Vaccine? Yes   Zostavax completed Yes   Shingrix Completed?: No.    Education has been provided regarding the importance of this vaccine. Patient has been advised to call insurance company to determine out of pocket expense if they have not yet received this vaccine. Advised may also receive vaccine at local pharmacy or Health Dept. Verbalized acceptance and understanding.  Screening Tests Health Maintenance  Topic Date Due   Zoster Vaccines- Shingrix (1 of 2) Never done   COVID-19 Vaccine (3 - Booster for Moderna series) 09/19/2019   INFLUENZA VACCINE  07/09/2021 (Originally 11/09/2020)   TETANUS/TDAP  12/02/2029   Pneumonia Vaccine 67+ Years old  Completed   HPV VACCINES  Aged Out    Health Maintenance  Health Maintenance Due  Topic Date Due   Zoster Vaccines- Shingrix (1 of 2) Never done   COVID-19 Vaccine (3 - Booster for Moderna series) 09/19/2019    Colorectal cancer screening: No longer required.   Lung Cancer Screening: (Low Dose CT Chest recommended if Age 19-80 years, 30 pack-year currently smoking OR have quit w/in 15years.) does not qualify.   Lung Cancer Screening Referral: no  Additional Screening:  Hepatitis C Screening: does not qualify; Completed no  Vision Screening: Recommended annual  ophthalmology exams for early detection of glaucoma and other disorders of the eye. Is the patient up to date with their annual eye exam?  No  Who is the provider or what is the name of the office in which the patient attends annual eye exams? Patient refused If pt is not established with a provider, would they like to be referred to a provider to establish care? No .   Dental Screening: Recommended annual dental exams for proper oral hygiene  Community Resource Referral / Chronic Care Management: CRR required this visit?  No   CCM required this visit?  No      Plan:     I have personally reviewed and noted the following in the patient's chart:   Medical and social history Use of alcohol, tobacco or illicit drugs  Current medications and supplements including opioid prescriptions. Patient is not currently taking opioid prescriptions. Functional ability and status Nutritional status Physical activity Advanced directives List of other physicians Hospitalizations, surgeries, and ER visits in previous 12 months Vitals Screenings to include cognitive, depression, and falls Referrals and appointments  In addition, I have reviewed and discussed with patient certain preventive protocols, quality metrics, and best practice recommendations. A written personalized care plan for preventive services as well as general preventive health recommendations were provided to patient.     Sheral Flow, LPN   [id number]   Nurse Notes:  Hearing Screening - Comments:: Patient denied any hearing difficulty.   No hearing aids.  Vision Screening - Comments:: Patient wears otc readers. Patient refused eye  exam.

## 2021-03-30 NOTE — Progress Notes (Signed)
Follow Up Note  RE: Christopher Rubio. MRN: [id number] DOB: 04-29-1933 Date of Office Visit: 03/31/2021  Referring provider: [name], MD Primary care provider: [name], MD  Chief Complaint: Pruitius (Patient states he has not seen any change)  History of Present Illness: I had the pleasure of seeing Christopher Rubio for a follow up visit at the [facility] on 03/31/2021. He is a 87 y.o. male, who is being followed for pruritus, contact dermatitis. His previous allergy office visit was on 03/08/2021 with Dr. Maudie Mercury. Today is a regular follow up visit.  Reviewed bloodwork results in detail. He had elevated Cr levels in the past. Tsh was always in normal range.  Eosinophils back to normal.  Pruritus Itching improves after using the lotion. Currently moisturizing twice a day.   Tried zyrtec and not sure if it's helping or not.   Rash on the feet appears at the end of the day usually.  Assessment and Plan: Christopher Rubio is a 87 y.o. male with: Pruritus Past history - Pruritic rash started 2.5 months ago. Previously had dry skin/rash and had patch testing which was positive to multiple items. Skin biopsy of rash was inconclusive per patient report. Interestingly had elevated eos of 1600 on 12/29/20. 65 year smoking history. Declines skin prick testing. Denies changes in diet, meds, personal care products around this time.  Interim history - 2022 bloodwork (CBC diff, ANA, CU, tryptase, alpha gal) all normal. Cr slightly elevated and TSH slightly elevated. Pruritus resolved with moisturization. The itching is from the dry skin most likely. Stop zyrtec if it's not helping.  Avoid the following potential triggers: alcohol, tight clothing, NSAIDs, hot showers and getting overheated. Continue proper skin care. Follow up with PCP regarding Cr and TSH - printed out results for patient.   Rash and other nonspecific skin eruption Rash on the feet still present -  worse as the day goes on. The other rashes resolved. Discussed that not sure what this rash is - question if the initial rash had anything to do with his elevated eos in the past.  Continue to monitor.   Seasonal and perennial allergic rhinitis Past history - 2022 Bloodwork was positive to dust mites, cat, dog, mold.  Borderline to tree and ragweed pollen. See below for environmental control measures.   Return in about 6 months (around 09/29/2021).  No orders of the defined types were placed in this encounter.  Lab Orders  No laboratory test(s) ordered today    Diagnostics: None.    Medication List:  Current Outpatient Medications  Medication Sig Dispense Refill   amLODipine (NORVASC) 5 MG tablet TAKE 1 TABLET(5 MG) BY MOUTH DAILY 90 tablet 2   aspirin 81 MG EC tablet TAKE 1 TABLET(81 MG) BY MOUTH DAILY 90 tablet 1   Cholecalciferol (VITAMIN D) 2000 UNITS CAPS Take 2,000 Units by mouth daily.     Colchicine (MITIGARE) 0.6 MG CAPS Take 1 tablet by mouth 2 (two) times daily as needed. 180 capsule 0   ezetimibe (ZETIA) 10 MG tablet TAKE 1 TABLET(10 MG) BY MOUTH DAILY 90 tablet 1   silodosin (RAPAFLO) 4 MG CAPS capsule TAKE 1 CAPSULE(4 MG) BY MOUTH DAILY WITH BREAKFAST 90 capsule 1   tacrolimus (PROTOPIC) 0.1 % ointment      tadalafil (CIALIS) 5 MG tablet TAKE 1 TABLET(5 MG) BY MOUTH DAILY AS NEEDED FOR ERECTILE DYSFUNCTION 90 tablet 1   thiamine (VITAMIN B-1) 50 MG tablet Take  1 tablet (50 mg total) by mouth daily. 90 tablet 0   XARELTO 2.5 MG TABS tablet TAKE 1 TABLET(2.5 MG) BY MOUTH TWICE DAILY 180 tablet 1   No current facility-administered medications for this visit.   Allergies: Allergies  Allergen Reactions   Keflex [Cephalexin] Rash   Statins     myopathy   Codeine Nausea And Vomiting and Other (See Comments)    dizziness   Neomycin-Bacitracin Zn-Polymyx Other (See Comments)    Neosporin caused rash when covered with bandaid   Penicillins    Prednisone Other (See  Comments)    Lips and tongue swelled up   I reviewed his past medical history, social history, family history, and environmental history and no significant changes have been reported from his previous visit.  Review of Systems  Constitutional:  Negative for appetite change, chills, fever and unexpected weight change.  HENT:  Negative for congestion and rhinorrhea.   Eyes:  Negative for itching.  Respiratory:  Negative for cough, chest tightness, shortness of breath and wheezing.   Cardiovascular:  Negative for chest pain.  Gastrointestinal:  Negative for abdominal pain.  Genitourinary:  Negative for difficulty urinating.  Skin:  Positive for rash.       itching  Neurological:  Negative for headaches.   Objective: BP 140/66    Pulse 66    Temp 98 F (36.7 C)    Resp 16    Ht 5\' 6"  (1.676 m)    Wt 144 lb 12.8 oz (65.7 kg)    SpO2 98%    BMI 23.37 kg/m  Body mass index is 23.37 kg/m. Physical Exam Vitals and nursing note reviewed.  Constitutional:      Appearance: Normal appearance. He is well-developed.  HENT:     Head: Normocephalic and atraumatic.     Right Ear: Tympanic membrane and external ear normal.     Left Ear: Tympanic membrane and external ear normal.     Nose: Nose normal.     Mouth/Throat:     Mouth: Mucous membranes are moist.     Pharynx: Oropharynx is clear.  Eyes:     Conjunctiva/sclera: Conjunctivae normal.  Cardiovascular:     Rate and Rhythm: Normal rate and regular rhythm.     Heart sounds: Normal heart sounds. No murmur heard.   No friction rub. No gallop.  Pulmonary:     Effort: Pulmonary effort is normal.     Breath sounds: Normal breath sounds. No wheezing, rhonchi or rales.  Musculoskeletal:     Cervical back: Neck supple.  Skin:    General: Skin is warm and dry.     Findings: Rash present.     Comments: Very frail and dry skin throughout. He has a few scattered circular patches of rash on the feet.  Neurological:     Mental Status: He is  alert and oriented to person, place, and time.  Psychiatric:        Behavior: Behavior normal.   Previous notes and tests were reviewed. The plan was reviewed with the patient/family, and all questions/concerned were addressed.  It was my pleasure to see Christopher Rubio today and participate in his care. Please feel free to contact me with any questions or concerns.  Sincerely,  Rexene Alberts, DO Allergy & Immunology  [facility] office: [place] office: [phone]

## 2021-03-31 ENCOUNTER — Encounter: Payer: Self-pay | Admitting: Allergy

## 2021-03-31 ENCOUNTER — Ambulatory Visit (INDEPENDENT_AMBULATORY_CARE_PROVIDER_SITE_OTHER): Payer: Medicare PPO | Admitting: Allergy

## 2021-03-31 ENCOUNTER — Other Ambulatory Visit: Payer: Self-pay

## 2021-03-31 VITALS — BP 140/66 | HR 66 | Temp 98.0°F | Resp 16 | Ht 66.0 in | Wt 144.8 lb

## 2021-03-31 DIAGNOSIS — J302 Other seasonal allergic rhinitis: Secondary | ICD-10-CM

## 2021-03-31 DIAGNOSIS — J3089 Other allergic rhinitis: Secondary | ICD-10-CM | POA: Diagnosis not present

## 2021-03-31 DIAGNOSIS — L299 Pruritus, unspecified: Secondary | ICD-10-CM | POA: Diagnosis not present

## 2021-03-31 DIAGNOSIS — R21 Rash and other nonspecific skin eruption: Secondary | ICD-10-CM

## 2021-03-31 NOTE — Assessment & Plan Note (Addendum)
Rash on the feet still present - worse as the day goes on. The other rashes resolved.  Discussed that not sure what this rash is - question if the initial rash had anything to do with his elevated eos in the past.   Continue to monitor.

## 2021-03-31 NOTE — Assessment & Plan Note (Signed)
Past history - 2022 Bloodwork was positive to dust mites, cat, dog, mold.  Borderline to tree and ragweed pollen.  See below for environmental control measures.

## 2021-03-31 NOTE — Patient Instructions (Addendum)
Rash/itching:  The itching is from the dry skin most likely. Stop zyrtec if it's not helping.  Avoid the following potential triggers: alcohol, tight clothing, NSAIDs, hot showers and getting overheated. Continue proper skin care. Follow up with your PCP regarding the kidneys and the thyroid.   Environmental allergies: Bloodwork was positive to dust mites, cat, dog, mold.  Borderline to tree and ragweed pollen. See below for environmental control measures.   Follow up in 6 months or sooner if needed.  Skin care recommendations  Bath time: Always use lukewarm water. AVOID very hot or cold water. Keep bathing time to 5-10 minutes. Do NOT use bubble bath. Use a mild soap and use just enough to wash the dirty areas. Do NOT scrub skin vigorously.  After bathing, pat dry your skin with a towel. Do NOT rub or scrub the skin.  Moisturizers and prescriptions:  ALWAYS apply moisturizers immediately after bathing (within 3 minutes). This helps to lock-in moisture. Use the moisturizer several times a day over the whole body. Good summer moisturizers include: Aveeno, CeraVe, Cetaphil. Good winter moisturizers include: Aquaphor, Vaseline, Cerave, Cetaphil, Eucerin, Vanicream. When using moisturizers along with medications, the moisturizer should be applied about one hour after applying the medication to prevent diluting effect of the medication or moisturize around where you applied the medications. When not using medications, the moisturizer can be continued twice daily as maintenance.  Laundry and clothing: Avoid laundry products with added color or perfumes. Use unscented hypo-allergenic laundry products such as Tide free, Cheer free & gentle, and All free and clear.  If the skin still seems dry or sensitive, you can try double-rinsing the clothes. Avoid tight or scratchy clothing such as wool. Do not use fabric softeners or dyer sheets.  Reducing Pollen Exposure Pollen seasons: trees  (spring), grass (summer) and ragweed/weeds (fall). Keep windows closed in your home and car to lower pollen exposure.  Install air conditioning in the bedroom and throughout the house if possible.  Avoid going out in dry windy days - especially early morning. Pollen counts are highest between 5 - 10 AM and on dry, hot and windy days.  Save outside activities for late afternoon or after a heavy rain, when pollen levels are lower.  Avoid mowing of grass if you have grass pollen allergy. Be aware that pollen can also be transported indoors on people and pets.  Dry your clothes in an automatic dryer rather than hanging them outside where they might collect pollen.  Rinse hair and eyes before bedtime. Mold Control Mold and fungi can grow on a variety of surfaces provided certain temperature and moisture conditions exist.  Outdoor molds grow on plants, decaying vegetation and soil. The major outdoor mold, Alternaria and Cladosporium, are found in very high numbers during hot and dry conditions. Generally, a late summer - fall peak is seen for common outdoor fungal spores. Rain will temporarily lower outdoor mold spore count, but counts rise rapidly when the rainy period ends. The most important indoor molds are Aspergillus and Penicillium. Dark, humid and poorly ventilated basements are ideal sites for mold growth. The next most common sites of mold growth are the bathroom and the kitchen. Outdoor (Seasonal) Mold Control Use air conditioning and keep windows closed. Avoid exposure to decaying vegetation. Avoid leaf raking. Avoid grain handling. Consider wearing a face mask if working in moldy areas.  Indoor (Perennial) Mold Control  Maintain humidity below 50%. Get rid of mold growth on hard surfaces with water, detergent  and, if necessary, 5% bleach (do not mix with other cleaners). Then dry the area completely. If mold covers an area more than 10 square feet, consider hiring an indoor environmental  professional. For clothing, washing with soap and water is best. If moldy items cannot be cleaned and dried, throw them away. Remove sources e.g. contaminated carpets. Repair and seal leaking roofs or pipes. Using dehumidifiers in damp basements may be helpful, but empty the water and clean units regularly to prevent mildew from forming. All rooms, especially basements, bathrooms and kitchens, require ventilation and cleaning to deter mold and mildew growth. Avoid carpeting on concrete or damp floors, and storing items in damp areas. Control of House Dust Mite Allergen Dust mite allergens are a common trigger of allergy and asthma symptoms. While they can be found throughout the house, these microscopic creatures thrive in warm, humid environments such as bedding, upholstered furniture and carpeting. Because so much time is spent in the bedroom, it is essential to reduce mite levels there.  Encase pillows, mattresses, and box springs in special allergen-proof fabric covers or airtight, zippered plastic covers.  Bedding should be washed weekly in hot water (130 F) and dried in a hot dryer. Allergen-proof covers are available for comforters and pillows that cant be regularly washed.  Wash the allergy-proof covers every few months. Minimize clutter in the bedroom. Keep pets out of the bedroom.  Keep humidity less than 50% by using a dehumidifier or air conditioning. You can buy a humidity measuring device called a hygrometer to monitor this.  If possible, replace carpets with hardwood, linoleum, or washable area rugs. If that's not possible, vacuum frequently with a vacuum that has a HEPA filter. Remove all upholstered furniture and non-washable window drapes from the bedroom. Remove all non-washable stuffed toys from the bedroom.  Wash stuffed toys weekly. Pet Allergen Avoidance: Contrary to popular opinion, there are no hypoallergenic breeds of dogs or cats. That is because people are not allergic  to an animals hair, but to an allergen found in the animal's saliva, dander (dead skin flakes) or urine. Pet allergy symptoms typically occur within minutes. For some people, symptoms can build up and become most severe 8 to 12 hours after contact with the animal. People with severe allergies can experience reactions in public places if dander has been transported on the pet owners clothing. Keeping an animal outdoors is only a partial solution, since homes with pets in the yard still have higher concentrations of animal allergens. Before getting a pet, ask your allergist to determine if you are allergic to animals. If your pet is already considered part of your family, try to minimize contact and keep the pet out of the bedroom and other rooms where you spend a great deal of time. As with dust mites, vacuum carpets often or replace carpet with a hardwood floor, tile or linoleum. High-efficiency particulate air (HEPA) cleaners can reduce allergen levels over time. While dander and saliva are the source of cat and dog allergens, urine is the source of allergens from rabbits, hamsters, mice and [place] pigs; so ask a non-allergic family member to clean the animals cage. If you have a pet allergy, talk to your allergist about the potential for allergy immunotherapy (allergy shots). This strategy can often provide long-term relief.

## 2021-03-31 NOTE — Assessment & Plan Note (Signed)
Past history - Pruritic rash started 2.5 months ago. Previously had dry skin/rash and had patch testing which was positive to multiple items. Skin biopsy of rash was inconclusive per patient report. Interestingly had elevated eos of 1600 on 12/29/20. 65 year smoking history. Declines skin prick testing. Denies changes in diet, meds, personal care products around this time.  Interim history - 2022 bloodwork (CBC diff, ANA, CU, tryptase, alpha gal) all normal. Cr slightly elevated and TSH slightly elevated. Pruritus resolved with moisturization.  The itching is from the dry skin most likely.  Stop zyrtec if it's not helping.   Avoid the following potential triggers: alcohol, tight clothing, NSAIDs, hot showers and getting overheated.  Continue proper skin care.  Follow up with PCP regarding Cr and TSH - printed out results for patient.

## 2021-04-13 ENCOUNTER — Ambulatory Visit: Payer: Medicare PPO | Admitting: Internal Medicine

## 2021-04-13 ENCOUNTER — Encounter: Payer: Self-pay | Admitting: Internal Medicine

## 2021-04-13 ENCOUNTER — Other Ambulatory Visit: Payer: Self-pay

## 2021-04-13 VITALS — BP 146/68 | HR 61 | Temp 98.1°F | Ht 66.0 in | Wt 144.0 lb

## 2021-04-13 DIAGNOSIS — N289 Disorder of kidney and ureter, unspecified: Secondary | ICD-10-CM

## 2021-04-13 DIAGNOSIS — M10371 Gout due to renal impairment, right ankle and foot: Secondary | ICD-10-CM

## 2021-04-13 DIAGNOSIS — I10 Essential (primary) hypertension: Secondary | ICD-10-CM

## 2021-04-13 DIAGNOSIS — L2084 Intrinsic (allergic) eczema: Secondary | ICD-10-CM

## 2021-04-13 DIAGNOSIS — M1A39X0 Chronic gout due to renal impairment, multiple sites, without tophus (tophi): Secondary | ICD-10-CM

## 2021-04-13 MED ORDER — COLCHICINE 0.6 MG PO CAPS: 1.0000 | ORAL_CAPSULE | Freq: Two times a day (BID) | ORAL | 0 refills | Status: DC | PRN

## 2021-04-13 MED ORDER — TACROLIMUS 0.1 % EX OINT: TOPICAL_OINTMENT | Freq: Two times a day (BID) | CUTANEOUS | 3 refills | Status: AC

## 2021-04-13 NOTE — Progress Notes (Signed)
Subjective:  Patient ID: [name], male    DOB: [birth year]  Age: 88 y.o. MRN: [id number]  CC: Rash and Hypertension  This visit occurred during the SARS-CoV-2 public health emergency.  Safety protocols were in place, including screening questions prior to the visit, additional usage of staff PPE, and extensive cleaning of exam room while observing appropriate contact time as indicated for disinfecting solutions.    HPI [name] presents for f/up -  He recently saw an allergist and says that his work-up was unremarkable.  He tells me he was prescribed Zyrtec but it's not helping much.  He tells me that the allergist thinks that his rash and itching is related to dry skin, hypothyroidism, and kidney disease.  He says he is allergic to all topical steroids.  He says his symptoms have not improved.  He has an outdated prescription for tacrolimus but he says when he uses it it helps.  Outpatient Medications Prior to Visit  Medication Sig Dispense Refill   amLODipine (NORVASC) 5 MG tablet TAKE 1 TABLET(5 MG) BY MOUTH DAILY 90 tablet 2   aspirin 81 MG EC tablet TAKE 1 TABLET(81 MG) BY MOUTH DAILY 90 tablet 1   Cholecalciferol (VITAMIN D) 2000 UNITS CAPS Take 2,000 Units by mouth daily.     ezetimibe (ZETIA) 10 MG tablet TAKE 1 TABLET(10 MG) BY MOUTH DAILY 90 tablet 1   silodosin (RAPAFLO) 4 MG CAPS capsule TAKE 1 CAPSULE(4 MG) BY MOUTH DAILY WITH BREAKFAST 90 capsule 1   tadalafil (CIALIS) 5 MG tablet TAKE 1 TABLET(5 MG) BY MOUTH DAILY AS NEEDED FOR ERECTILE DYSFUNCTION 90 tablet 1   thiamine (VITAMIN B-1) 50 MG tablet Take 1 tablet (50 mg total) by mouth daily. 90 tablet 0   XARELTO 2.5 MG TABS tablet TAKE 1 TABLET(2.5 MG) BY MOUTH TWICE DAILY 180 tablet 1   Colchicine (MITIGARE) 0.6 MG CAPS Take 1 tablet by mouth 2 (two) times daily as needed. 180 capsule 0   tacrolimus (PROTOPIC) 0.1 % ointment      No facility-administered medications prior to visit.     ROS Review of Systems  Constitutional:  Negative for chills, diaphoresis, fatigue and fever.  HENT: Negative.    Eyes: Negative.   Respiratory:  Negative for cough, chest tightness, shortness of breath and wheezing.   Cardiovascular:  Negative for chest pain, palpitations and leg swelling.  Gastrointestinal:  Negative for abdominal pain, constipation, diarrhea, nausea and vomiting.  Endocrine: Negative.   Genitourinary: Negative.  Negative for difficulty urinating and dysuria.  Musculoskeletal:  Negative for arthralgias and myalgias.  Skin:  Positive for rash. Negative for color change.  Neurological: Negative.  Negative for dizziness and weakness.  Hematological:  Negative for adenopathy. Does not bruise/bleed easily.  Psychiatric/Behavioral: Negative.     Objective:  BP (!) 146/68 (BP Location: Left Arm, Patient Position: Sitting, Cuff Size: Large)    Pulse 61    Temp 98.1 F (36.7 C) (Oral)    Ht 5\' 6"  (1.676 m)    Wt 144 lb (65.3 kg)    SpO2 99%    BMI 23.24 kg/m   BP Readings from Last 3 Encounters:  04/13/21 (!) 146/68  03/31/21 140/66  03/19/21 (!) 150/60    Wt Readings from Last 3 Encounters:  04/13/21 144 lb (65.3 kg)  03/31/21 144 lb 12.8 oz (65.7 kg)  03/19/21 140 lb (63.5 kg)    Physical Exam Vitals reviewed.  HENT:  Nose: Nose normal.     Mouth/Throat:     Mouth: Mucous membranes are moist.  Eyes:     General: No scleral icterus.    Conjunctiva/sclera: Conjunctivae normal.  Cardiovascular:     Rate and Rhythm: Normal rate and regular rhythm.     Heart sounds: Murmur heard.  Systolic murmur is present with a grade of 1/6.  No diastolic murmur is present.  Pulmonary:     Effort: Pulmonary effort is normal.     Breath sounds: No stridor. No wheezing, rhonchi or rales.  Abdominal:     General: Abdomen is flat.     Palpations: There is no mass.     Tenderness: There is no abdominal tenderness. There is no guarding.     Hernia: No hernia is  present.  Musculoskeletal:        General: Normal range of motion.     Cervical back: Neck supple.     Right lower leg: No edema.     Left lower leg: No edema.  Lymphadenopathy:     Cervical: No cervical adenopathy.  Skin:    General: Skin is warm and dry.     Coloration: Skin is not pale.     Findings: Rash present. No abscess. Rash is macular, papular and scaling. Rash is not crusting, pustular, urticarial or vesicular.     Comments: There are multiple areas on his torso and extremities that are erythematous macules and papules with some scaling and peeling.  See photos.  Neurological:     General: No focal deficit present.     Mental Status: He is alert.    Lab Results  Component Value Date   WBC 8.4 03/08/2021   HGB 14.3 03/08/2021   HCT 42.4 03/08/2021   PLT 252 03/08/2021   GLUCOSE 99 03/08/2021   CHOL 177 12/29/2020   TRIG 139.0 12/29/2020   HDL 37.00 (L) 12/29/2020   LDLDIRECT 95.0 08/15/2019   LDLCALC 112 (H) 12/29/2020   ALT 15 03/08/2021   AST 17 03/08/2021   NA 140 03/08/2021   K 4.8 03/08/2021   CL 103 03/08/2021   CREATININE 1.50 (H) 03/08/2021   BUN 27 03/08/2021   CO2 21 03/08/2021   TSH 4.850 (H) 03/08/2021   PSA 2.40 12/11/2014    DG Foot Complete Right  Result Date: 03/02/2020 CLINICAL DATA:  Acute right foot pain and swelling without known injury. EXAM: RIGHT FOOT COMPLETE - 3+ VIEW COMPARISON:  None. FINDINGS: There is no evidence of fracture or dislocation. Erosion or lucency is seen involving the distal portion of the first metatarsal with adjacent soft tissue swelling consistent with gout. Soft tissues are unremarkable. IMPRESSION: Findings consistent with gout involving the distal portion of the first metatarsal. No acute fracture or dislocation is noted. Electronically Signed   By: Marijo Conception M.D.   On: 03/02/2020 16:26    Assessment & Plan:   Silas was seen today for rash and hypertension.  Diagnoses and all orders for this  visit:  Chronic gout due to renal impairment of multiple sites without tophus- Will continue colchicine for symptom relief. -     Colchicine (MITIGARE) 0.6 MG CAPS; Take 1 tablet by mouth 2 (two) times daily as needed.  Acute gout due to renal impairment involving right foot  Intrinsic eczema- Will treat this with tacrolimus. -     tacrolimus (PROTOPIC) 0.1 % ointment; Apply topically 2 (two) times daily.  Primary hypertension- His blood pressure is adequately  well controlled.   I have changed Taos L. Zuckerman Jr.'s tacrolimus. I am also having him maintain his Vitamin D, thiamine, aspirin, tadalafil, amLODipine, Xarelto, silodosin, ezetimibe, and Colchicine.  Meds ordered this encounter  Medications   Colchicine (MITIGARE) 0.6 MG CAPS    Sig: Take 1 tablet by mouth 2 (two) times daily as needed.    Dispense:  180 capsule    Refill:  0   tacrolimus (PROTOPIC) 0.1 % ointment    Sig: Apply topically 2 (two) times daily.    Dispense:  100 g    Refill:  3     Follow-up: Return in about 6 months (around 10/11/2021).  Scarlette Calico, MD

## 2021-04-13 NOTE — Patient Instructions (Signed)
Atopic Dermatitis Atopic dermatitis is a skin disorder that causes inflammation of the skin. It is marked by a red rash and itchy, dry, scaly skin. It is the most common type of eczema. Eczema is a group of skin conditions that cause the skin to become rough and swollen. This condition is generally worse during the cooler wintermonths and often improves during the warm summer months. Atopic dermatitis usually starts showing signs in infancy and can last through adulthood. This condition cannot be passed from one person to another (is not contagious). Atopic dermatitis may not always be present, but when it is, it is called aflare-up. What are the causes? The exact cause of this condition is not known. Flare-ups may be triggered by: Coming in contact with something that you are sensitive or allergic to (allergen). Stress. Certain foods. Extremely hot or cold weather. Harsh chemicals and soaps. Dry air. Chlorine. What increases the risk? This condition is more likely to develop in people who have a personal or family history of: Eczema. Allergies. Asthma. Hay fever. What are the signs or symptoms? Symptoms of this condition include: Dry, scaly skin. Red, itchy rash. Itchiness, which can be severe. This may occur before the skin rash. This can make sleeping difficult. Skin thickening and cracking that can occur over time. How is this diagnosed? This condition is diagnosed based on: Your symptoms. Your medical history. A physical exam. How is this treated? There is no cure for this condition, but symptoms can usually be controlled. Treatment focuses on: Controlling the itchiness and scratching. You may be given medicines, such as antihistamines or steroid creams. Limiting exposure to allergens. Recognizing situations that cause stress and developing a plan to manage stress. If your atopic dermatitis does not get better with medicines, or if it is all over your body (widespread), a  treatment using a specific type of light (phototherapy) may be used. Follow these instructions at home: Skin care  Keep your skin well moisturized. Doing this seals in moisture and helps to prevent dryness. Use unscented lotions that have petroleum in them. Avoid lotions that contain alcohol or water. They can dry the skin. Keep baths or showers short (less than 5 minutes) in warm water. Do not use hot water. Use mild, unscented cleansers for bathing. Avoid soap and bubble bath. Apply a moisturizer to your skin right after a bath or shower. Do not apply anything to your skin without checking with your health care provider.  General instructions Take or apply over-the-counter and prescription medicines only as told by your health care provider. Dress in clothes made of cotton or cotton blends. Dress lightly because heat increases itchiness. When washing your clothes, rinse your clothes twice so all of the soap is removed. Avoid any triggers that can cause a flare-up. Keep your fingernails cut short. Avoid scratching. Scratching makes the rash and itchiness worse. A break in the skin from scratching could result in a skin infection (impetigo). Do not be around people who have cold sores or fever blisters. If you get the infection, it may cause your atopic dermatitis to worsen. Keep all follow-up visits. This is important. Contact a health care provider if: Your itchiness interferes with sleep. Your rash gets worse or is not better within one week of starting treatment. You have a fever. You have a rash flare-up after having contact with someone who has cold sores or fever blisters. Get help right away if: You develop pus or soft yellow scabs in the rash area.   Summary Atopic dermatitis causes a red rash and itchy, dry, scaly skin. Treatment focuses on controlling the itchiness and scratching, limiting exposure to things that you are sensitive or allergic to (allergens), recognizing  situations that cause stress, and developing a plan to manage stress. Keep your skin well moisturized. Keep baths or showers shorter than 5 minutes and use warm water. Do not use hot water. This information is not intended to replace advice given to you by your health care provider. Make sure you discuss any questions you have with your healthcare provider. Document Revised: 01/06/2020 Document Reviewed: 01/06/2020 [employer] Patient Education  2022 [employer]  

## 2021-04-21 ENCOUNTER — Ambulatory Visit: Payer: Medicare PPO | Admitting: Internal Medicine

## 2021-04-21 DIAGNOSIS — I771 Stricture of artery: Secondary | ICD-10-CM | POA: Diagnosis not present

## 2021-04-21 DIAGNOSIS — J45909 Unspecified asthma, uncomplicated: Secondary | ICD-10-CM | POA: Diagnosis not present

## 2021-04-21 DIAGNOSIS — E785 Hyperlipidemia, unspecified: Secondary | ICD-10-CM | POA: Diagnosis not present

## 2021-04-21 DIAGNOSIS — F17200 Nicotine dependence, unspecified, uncomplicated: Secondary | ICD-10-CM | POA: Diagnosis not present

## 2021-04-21 DIAGNOSIS — I10 Essential (primary) hypertension: Secondary | ICD-10-CM | POA: Diagnosis not present

## 2021-04-21 DIAGNOSIS — N400 Enlarged prostate without lower urinary tract symptoms: Secondary | ICD-10-CM | POA: Diagnosis not present

## 2021-04-21 DIAGNOSIS — N529 Male erectile dysfunction, unspecified: Secondary | ICD-10-CM | POA: Diagnosis not present

## 2021-04-21 DIAGNOSIS — I739 Peripheral vascular disease, unspecified: Secondary | ICD-10-CM | POA: Diagnosis not present

## 2021-04-21 DIAGNOSIS — M109 Gout, unspecified: Secondary | ICD-10-CM | POA: Diagnosis not present

## 2021-05-03 ENCOUNTER — Other Ambulatory Visit: Payer: Self-pay | Admitting: Cardiovascular Disease

## 2021-05-06 DIAGNOSIS — L309 Dermatitis, unspecified: Secondary | ICD-10-CM | POA: Diagnosis not present

## 2021-05-17 ENCOUNTER — Ambulatory Visit: Payer: Medicare PPO | Admitting: Allergy

## 2021-08-12 ENCOUNTER — Other Ambulatory Visit: Payer: Self-pay | Admitting: Internal Medicine

## 2021-08-12 DIAGNOSIS — I739 Peripheral vascular disease, unspecified: Secondary | ICD-10-CM

## 2021-08-12 DIAGNOSIS — N138 Other obstructive and reflux uropathy: Secondary | ICD-10-CM

## 2021-08-12 DIAGNOSIS — E785 Hyperlipidemia, unspecified: Secondary | ICD-10-CM

## 2021-09-29 ENCOUNTER — Ambulatory Visit: Payer: Medicare PPO | Admitting: Allergy

## 2021-11-10 ENCOUNTER — Other Ambulatory Visit: Payer: Self-pay | Admitting: Cardiovascular Disease

## 2021-11-10 NOTE — Telephone Encounter (Signed)
Please call pt to schedule overdue appt for refills. Last seen in 2021.

## 2021-11-16 DIAGNOSIS — Z6822 Body mass index (BMI) 22.0-22.9, adult: Secondary | ICD-10-CM | POA: Diagnosis not present

## 2021-11-16 DIAGNOSIS — H6121 Impacted cerumen, right ear: Secondary | ICD-10-CM | POA: Diagnosis not present

## 2022-02-08 ENCOUNTER — Other Ambulatory Visit: Payer: Self-pay | Admitting: Internal Medicine

## 2022-02-08 DIAGNOSIS — E785 Hyperlipidemia, unspecified: Secondary | ICD-10-CM

## 2022-02-08 DIAGNOSIS — I739 Peripheral vascular disease, unspecified: Secondary | ICD-10-CM

## 2022-02-14 ENCOUNTER — Other Ambulatory Visit: Payer: Self-pay | Admitting: Cardiovascular Disease

## 2022-02-15 ENCOUNTER — Other Ambulatory Visit: Payer: Self-pay | Admitting: Internal Medicine

## 2022-02-21 ENCOUNTER — Other Ambulatory Visit: Payer: Self-pay | Admitting: Internal Medicine

## 2022-02-22 ENCOUNTER — Ambulatory Visit: Payer: Medicare PPO | Admitting: Internal Medicine

## 2022-02-22 ENCOUNTER — Encounter: Payer: Self-pay | Admitting: Internal Medicine

## 2022-02-22 VITALS — BP 148/86 | HR 62 | Temp 98.2°F | Resp 16 | Ht 66.0 in | Wt 137.0 lb

## 2022-02-22 DIAGNOSIS — M1000 Idiopathic gout, unspecified site: Secondary | ICD-10-CM | POA: Diagnosis not present

## 2022-02-22 DIAGNOSIS — Z23 Encounter for immunization: Secondary | ICD-10-CM

## 2022-02-22 DIAGNOSIS — E519 Thiamine deficiency, unspecified: Secondary | ICD-10-CM

## 2022-02-22 DIAGNOSIS — Z0000 Encounter for general adult medical examination without abnormal findings: Secondary | ICD-10-CM

## 2022-02-22 DIAGNOSIS — E785 Hyperlipidemia, unspecified: Secondary | ICD-10-CM

## 2022-02-22 DIAGNOSIS — R3121 Asymptomatic microscopic hematuria: Secondary | ICD-10-CM | POA: Insufficient documentation

## 2022-02-22 DIAGNOSIS — R001 Bradycardia, unspecified: Secondary | ICD-10-CM | POA: Diagnosis not present

## 2022-02-22 DIAGNOSIS — F04 Amnestic disorder due to known physiological condition: Secondary | ICD-10-CM

## 2022-02-22 DIAGNOSIS — I739 Peripheral vascular disease, unspecified: Secondary | ICD-10-CM

## 2022-02-22 DIAGNOSIS — N1831 Chronic kidney disease, stage 3a: Secondary | ICD-10-CM | POA: Diagnosis not present

## 2022-02-22 DIAGNOSIS — Z0001 Encounter for general adult medical examination with abnormal findings: Secondary | ICD-10-CM

## 2022-02-22 DIAGNOSIS — I10 Essential (primary) hypertension: Secondary | ICD-10-CM

## 2022-02-22 DIAGNOSIS — E538 Deficiency of other specified B group vitamins: Secondary | ICD-10-CM | POA: Diagnosis not present

## 2022-02-22 LAB — CBC WITH DIFFERENTIAL/PLATELET
Basophils Absolute: 0.2 10*3/uL — ABNORMAL HIGH (ref 0.0–0.1)
Basophils Relative: 2.7 % (ref 0.0–3.0)
Eosinophils Absolute: 0.3 10*3/uL (ref 0.0–0.7)
Eosinophils Relative: 3.6 % (ref 0.0–5.0)
HCT: 40.7 % (ref 39.0–52.0)
Hemoglobin: 13.7 g/dL (ref 13.0–17.0)
Lymphocytes Relative: 24.7 % (ref 12.0–46.0)
Lymphs Abs: 2.1 10*3/uL (ref 0.7–4.0)
MCHC: 33.7 g/dL (ref 30.0–36.0)
MCV: 88.4 fl (ref 78.0–100.0)
Monocytes Absolute: 0.9 10*3/uL (ref 0.1–1.0)
Monocytes Relative: 11.1 % (ref 3.0–12.0)
Neutro Abs: 4.9 10*3/uL (ref 1.4–7.7)
Neutrophils Relative %: 57.9 % (ref 43.0–77.0)
Platelets: 219 10*3/uL (ref 150.0–400.0)
RBC: 4.61 Mil/uL (ref 4.22–5.81)
RDW: 14.9 % (ref 11.5–15.5)
WBC: 8.5 10*3/uL (ref 4.0–10.5)

## 2022-02-22 LAB — LIPID PANEL
Cholesterol: 199 mg/dL (ref 0–200)
HDL: 37.5 mg/dL — ABNORMAL LOW (ref >39.00)
LDL Cholesterol: 129 mg/dL — ABNORMAL HIGH (ref 0–99)
NonHDL: 161.42
Total CHOL/HDL Ratio: 5
Triglycerides: 162 mg/dL — ABNORMAL HIGH (ref 0.0–149.0)
VLDL: 32.4 mg/dL (ref 0.0–40.0)

## 2022-02-22 LAB — URINALYSIS, ROUTINE W REFLEX MICROSCOPIC
Bilirubin Urine: NEGATIVE
Ketones, ur: NEGATIVE
Leukocytes,Ua: NEGATIVE
Nitrite: NEGATIVE
Specific Gravity, Urine: 1.025 (ref 1.000–1.030)
Total Protein, Urine: >=300 — AB
Urine Glucose: NEGATIVE
Urobilinogen, UA: 0.2 (ref 0.0–1.0)
pH: 6 (ref 5.0–8.0)

## 2022-02-22 LAB — BASIC METABOLIC PANEL
BUN: 25 mg/dL — ABNORMAL HIGH (ref 6–23)
CO2: 29 mEq/L (ref 19–32)
Calcium: 9.2 mg/dL (ref 8.4–10.5)
Chloride: 105 mEq/L (ref 96–112)
Creatinine, Ser: 1.4 mg/dL (ref 0.40–1.50)
GFR: 45.65 mL/min — ABNORMAL LOW (ref >60.00)
Glucose, Bld: 99 mg/dL (ref 70–99)
Potassium: 4.3 mEq/L (ref 3.5–5.1)
Sodium: 139 mEq/L (ref 135–145)

## 2022-02-22 LAB — HEPATIC FUNCTION PANEL
ALT: 15 U/L (ref 0–53)
AST: 18 U/L (ref 0–37)
Albumin: 4.1 g/dL (ref 3.5–5.2)
Alkaline Phosphatase: 102 U/L (ref 39–117)
Bilirubin, Direct: 0.1 mg/dL (ref 0.0–0.3)
Total Bilirubin: 0.4 mg/dL (ref 0.2–1.2)
Total Protein: 7 g/dL (ref 6.0–8.3)

## 2022-02-22 LAB — TSH: TSH: 4.36 u[IU]/mL (ref 0.35–5.50)

## 2022-02-22 LAB — URIC ACID: Uric Acid, Serum: 6.3 mg/dL (ref 4.0–7.8)

## 2022-02-22 MED ORDER — SHINGRIX 50 MCG/0.5ML IM SUSR: 0.5000 mL | Freq: Once | INTRAMUSCULAR | 1 refills | Status: AC

## 2022-02-22 MED ORDER — AMLODIPINE BESYLATE 5 MG PO TABS: ORAL_TABLET | ORAL | 1 refills | Status: DC

## 2022-02-22 MED ORDER — VITAMIN B-1 50 MG PO TABS: 50.0000 mg | ORAL_TABLET | Freq: Every day | ORAL | 1 refills | Status: DC

## 2022-02-22 MED ORDER — XARELTO 2.5 MG PO TABS: ORAL_TABLET | ORAL | 1 refills | Status: DC

## 2022-02-22 MED ORDER — ASPIRIN 81 MG PO TBEC: 81.0000 mg | DELAYED_RELEASE_TABLET | Freq: Every day | ORAL | 1 refills | Status: DC

## 2022-02-22 NOTE — Progress Notes (Signed)
Subjective:  Patient ID: Christopher Aid., male    DOB: [birth year]  Age: 88 y.o. MRN: [id number]  CC: Annual Exam, Hypertension, and Hyperlipidemia   HPI Christopher Aid. presents for a CPX and f/up -   He is active and denies chest pain, diaphoresis, dizziness, lightheadedness, or edema.  Outpatient Medications Prior to Visit  Medication Sig Dispense Refill   Cholecalciferol (VITAMIN D) 2000 UNITS CAPS Take 2,000 Units by mouth daily.     ezetimibe (ZETIA) 10 MG tablet TAKE 1 TABLET(10 MG) BY MOUTH DAILY 90 tablet 1   silodosin (RAPAFLO) 4 MG CAPS capsule TAKE 1 CAPSULE(4 MG) BY MOUTH DAILY WITH BREAKFAST 90 capsule 1   tacrolimus (PROTOPIC) 0.1 % ointment Apply topically 2 (two) times daily. 100 g 3   amLODipine (NORVASC) 5 MG tablet TAKE 1 TABLET(5 MG) BY MOUTH DAILY 90 tablet 2   aspirin 81 MG EC tablet TAKE 1 TABLET(81 MG) BY MOUTH DAILY 90 tablet 1   Colchicine (MITIGARE) 0.6 MG CAPS Take 1 tablet by mouth 2 (two) times daily as needed. 180 capsule 0   tadalafil (CIALIS) 5 MG tablet TAKE 1 TABLET(5 MG) BY MOUTH DAILY AS NEEDED FOR ERECTILE DYSFUNCTION 90 tablet 1   thiamine (VITAMIN B-1) 50 MG tablet Take 1 tablet (50 mg total) by mouth daily. 90 tablet 0   XARELTO 2.5 MG TABS tablet TAKE 1 TABLET(2.5 MG) BY MOUTH TWICE DAILY 180 tablet 1   No facility-administered medications prior to visit.    ROS Review of Systems  Constitutional: Negative.  Negative for chills, diaphoresis, fatigue and fever.  HENT: Negative.    Eyes: Negative.   Respiratory:  Negative for chest tightness, shortness of breath and wheezing.   Cardiovascular:  Negative for chest pain, palpitations and leg swelling.  Gastrointestinal:  Negative for abdominal pain, constipation, diarrhea, nausea and vomiting.  Endocrine: Negative.   Genitourinary: Negative.  Negative for difficulty urinating.  Musculoskeletal: Negative.   Skin: Negative.   Neurological:  Negative for dizziness.   Hematological:  Negative for adenopathy. Does not bruise/bleed easily.  Psychiatric/Behavioral: Negative.      Objective:  BP (!) 148/86 (BP Location: Left Arm, Patient Position: Sitting, Cuff Size: Large)   Pulse 62   Temp 98.2 F (36.8 C) (Oral)   Resp 16   Ht '5\' 6"'$  (1.676 m)   Wt 137 lb (62.1 kg)   SpO2 93%   BMI 22.11 kg/m   BP Readings from Last 3 Encounters:  02/22/22 (!) 148/86  04/13/21 (!) 146/68  03/31/21 140/66    Wt Readings from Last 3 Encounters:  02/22/22 137 lb (62.1 kg)  04/13/21 144 lb (65.3 kg)  03/31/21 144 lb 12.8 oz (65.7 kg)    Physical Exam Vitals reviewed.  HENT:     Mouth/Throat:     Mouth: Mucous membranes are moist.  Eyes:     General: No scleral icterus.    Conjunctiva/sclera: Conjunctivae normal.  Cardiovascular:     Rate and Rhythm: Normal rate and regular rhythm.     Pulses:          Dorsalis pedis pulses are 1+ on the right side and 1+ on the left side.       Posterior tibial pulses are 0 on the right side and 0 on the left side.     Heart sounds: Murmur heard.     Systolic murmur is present with a grade of 2/6.     No diastolic murmur  is present.     No gallop.     Comments: 2/6 harsh SEM RUSB Pulmonary:     Effort: Pulmonary effort is normal.     Breath sounds: Examination of the left-middle field reveals rhonchi. Rhonchi present. No decreased breath sounds, wheezing or rales.  Abdominal:     General: Abdomen is flat.     Palpations: There is no mass.     Tenderness: There is no abdominal tenderness. There is no guarding.     Hernia: No hernia is present.  Musculoskeletal:     Cervical back: Neck supple.     Right lower leg: No edema.     Left lower leg: No edema.  Feet:     Right foot:     Skin integrity: Skin integrity normal.     Toenail Condition: Right toenails are normal.     Left foot:     Skin integrity: Skin integrity normal.     Toenail Condition: Left toenails are normal.  Lymphadenopathy:     Cervical:  No cervical adenopathy.  Skin:    General: Skin is warm and dry.  Neurological:     General: No focal deficit present.     Mental Status: He is alert.  Psychiatric:        Mood and Affect: Mood normal.        Behavior: Behavior normal.     Lab Results  Component Value Date   WBC 8.5 02/22/2022   HGB 13.7 02/22/2022   HCT 40.7 02/22/2022   PLT 219.0 02/22/2022   GLUCOSE 99 02/22/2022   CHOL 199 02/22/2022   TRIG 162.0 (H) 02/22/2022   HDL 37.50 (L) 02/22/2022   LDLDIRECT 95.0 08/15/2019   LDLCALC 129 (H) 02/22/2022   ALT 15 02/22/2022   AST 18 02/22/2022   NA 139 02/22/2022   K 4.3 02/22/2022   CL 105 02/22/2022   CREATININE 1.40 02/22/2022   BUN 25 (H) 02/22/2022   CO2 29 02/22/2022   TSH 4.36 02/22/2022   PSA 2.40 12/11/2014    DG Foot Complete Right  Result Date: 03/02/2020 CLINICAL DATA:  Acute right foot pain and swelling without known injury. EXAM: RIGHT FOOT COMPLETE - 3+ VIEW COMPARISON:  None. FINDINGS: There is no evidence of fracture or dislocation. Erosion or lucency is seen involving the distal portion of the first metatarsal with adjacent soft tissue swelling consistent with gout. Soft tissues are unremarkable. IMPRESSION: Findings consistent with gout involving the distal portion of the first metatarsal. No acute fracture or dislocation is noted. Electronically Signed   By: Marijo Conception M.D.   On: 03/02/2020 16:26    Assessment & Plan:   Ikaika was seen today for annual exam, hypertension and hyperlipidemia.  Diagnoses and all orders for this visit:  Flu vaccine need -     Flu Vaccine QUAD High Dose(Fluad)  Primary hypertension- His blood pressure is adequately well-controlled. -     CBC with Differential/Platelet; Future -     Hepatic function panel; Future -     amLODipine (NORVASC) 5 MG tablet; TAKE 1 TABLET(5 MG) BY MOUTH DAILY -     Hepatic function panel -     CBC with Differential/Platelet  Hyperlipidemia with target LDL less than 70-  He will not take statin.  -     Lipid panel; Future -     TSH; Future -     Hepatic function panel; Future -     Hepatic function panel -  TSH -     Lipid panel  Idiopathic gout, unspecified chronicity, unspecified site- He has achieved his uric acid goal. -     Uric acid; Future -     Uric acid  Bradycardia- His heart rate is normal now and he is asymptomatic. -     TSH; Future -     TSH  Stage 3a chronic kidney disease ([place])- His renal function is stable. -     CBC with Differential/Platelet; Future -     Urinalysis, Routine w reflex microscopic; Future -     Basic metabolic panel; Future -     Basic metabolic panel -     Urinalysis, Routine w reflex microscopic -     CBC with Differential/Platelet  PAD (peripheral artery disease) ([facility]) -     rivaroxaban (XARELTO) 2.5 MG TABS tablet; TAKE 1 TABLET(2.5 MG) BY MOUTH TWICE DAILY -     aspirin EC 81 MG tablet; Take 1 tablet (81 mg total) by mouth daily. Swallow whole.  Need for prophylactic vaccination and inoculation against varicella -     Zoster Vaccine Adjuvanted [facility]) injection; Inject 0.5 mLs into the muscle once for 1 dose.  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  Encounter for general adult medical examination with abnormal findings- Exam completed, labs reviewed, vaccines reviewed and updated, no cancer screenings indicated, patient education was given.   I have discontinued [facility]s aspirin EC, tadalafil, and Colchicine. I have also changed his Xarelto. Additionally, I am having him start on Shingrix and aspirin EC. Lastly, I am having him maintain his Vitamin D, tacrolimus, silodosin, ezetimibe, amLODipine, and thiamine.  Meds ordered this encounter  Medications   rivaroxaban (XARELTO) 2.5 MG TABS tablet    Sig: TAKE 1 TABLET(2.5 MG) BY MOUTH TWICE DAILY    Dispense:  180  tablet    Refill:  1   amLODipine (NORVASC) 5 MG tablet    Sig: TAKE 1 TABLET(5 MG) BY MOUTH DAILY    Dispense:  90 tablet    Refill:  1   Zoster Vaccine Adjuvanted [facility]) injection    Sig: Inject 0.5 mLs into the muscle once for 1 dose.    Dispense:  0.5 mL    Refill:  1   thiamine (VITAMIN B-1) 50 MG tablet    Sig: Take 1 tablet (50 mg total) by mouth daily.    Dispense:  90 tablet    Refill:  1   aspirin EC 81 MG tablet    Sig: Take 1 tablet (81 mg total) by mouth daily. Swallow whole.    Dispense:  90 tablet    Refill:  1     Follow-up: Return in about 6 months (around 08/23/2022).  Scarlette Calico, MD

## 2022-02-22 NOTE — Patient Instructions (Signed)
Health Maintenance, Male Adopting a healthy lifestyle and getting preventive care are important in promoting health and wellness. Ask your health care provider about: The right schedule for you to have regular tests and exams. Things you can do on your own to prevent diseases and keep yourself healthy. What should I know about diet, weight, and exercise? Eat a healthy diet  Eat a diet that includes plenty of vegetables, fruits, low-fat dairy products, and lean protein. Do not eat a lot of foods that are high in solid fats, added sugars, or sodium. Maintain a healthy weight Body mass index (BMI) is a measurement that can be used to identify possible weight problems. It estimates body fat based on height and weight. Your health care provider can help determine your BMI and help you achieve or maintain a healthy weight. Get regular exercise Get regular exercise. This is one of the most important things you can do for your health. Most adults should: Exercise for at least 150 minutes each week. The exercise should increase your heart rate and make you sweat (moderate-intensity exercise). Do strengthening exercises at least twice a week. This is in addition to the moderate-intensity exercise. Spend less time sitting. Even light physical activity can be beneficial. Watch cholesterol and blood lipids Have your blood tested for lipids and cholesterol at 88 years of age, then have this test every 5 years. You may need to have your cholesterol levels checked more often if: Your lipid or cholesterol levels are high. You are older than 86 years of age. You are at high risk for heart disease. What should I know about cancer screening? Many types of cancers can be detected early and may often be prevented. Depending on your health history and family history, you may need to have cancer screening at various ages. This may include screening for: Colorectal cancer. Prostate cancer. Skin cancer. Lung  cancer. What should I know about heart disease, diabetes, and high blood pressure? Blood pressure and heart disease High blood pressure causes heart disease and increases the risk of stroke. This is more likely to develop in people who have high blood pressure readings or are overweight. Talk with your health care provider about your target blood pressure readings. Have your blood pressure checked: Every 3-5 years if you are 18-39 years of age. Every year if you are 40 years old or older. If you are between the ages of 65 and 75 and are a current or former smoker, ask your health care provider if you should have a one-time screening for abdominal aortic aneurysm (AAA). Diabetes Have regular diabetes screenings. This checks your fasting blood sugar level. Have the screening done: Once every three years after age 45 if you are at a normal weight and have a low risk for diabetes. More often and at a younger age if you are overweight or have a high risk for diabetes. What should I know about preventing infection? Hepatitis B If you have a higher risk for hepatitis B, you should be screened for this virus. Talk with your health care provider to find out if you are at risk for hepatitis B infection. Hepatitis C Blood testing is recommended for: Everyone born from 1945 through 1965. Anyone with known risk factors for hepatitis C. Sexually transmitted infections (STIs) You should be screened each year for STIs, including gonorrhea and chlamydia, if: You are sexually active and are younger than 86 years of age. You are older than 86 years of age and your   health care provider tells you that you are at risk for this type of infection. Your sexual activity has changed since you were last screened, and you are at increased risk for chlamydia or gonorrhea. Ask your health care provider if you are at risk. Ask your health care provider about whether you are at high risk for HIV. Your health care provider  may recommend a prescription medicine to help prevent HIV infection. If you choose to take medicine to prevent HIV, you should first get tested for HIV. You should then be tested every 3 months for as long as you are taking the medicine. Follow these instructions at home: Alcohol use Do not drink alcohol if your health care provider tells you not to drink. If you drink alcohol: Limit how much you have to 0-2 drinks a day. Know how much alcohol is in your drink. In the [place] one drink equals one 12 oz bottle of beer (355 mL), one 5 oz glass of wine (148 mL), or one 1 oz glass of hard liquor (44 mL). Lifestyle Do not use any products that contain nicotine or tobacco. These products include cigarettes, chewing tobacco, and vaping devices, such as e-cigarettes. If you need help quitting, ask your health care provider. Do not use street drugs. Do not share needles. Ask your health care provider for help if you need support or information about quitting drugs. General instructions Schedule regular health, dental, and eye exams. Stay current with your vaccines. Tell your health care provider if: You often feel depressed. You have ever been abused or do not feel safe at home. Summary Adopting a healthy lifestyle and getting preventive care are important in promoting health and wellness. Follow your health care provider's instructions about healthy diet, exercising, and getting tested or screened for diseases. Follow your health care provider's instructions on monitoring your cholesterol and blood pressure. This information is not intended to replace advice given to you by your health care provider. Make sure you discuss any questions you have with your health care provider. Document Revised: 08/17/2020 Document Reviewed: 08/17/2020 Elsevier Patient Education  2023 [employer]  

## 2022-03-04 ENCOUNTER — Encounter: Payer: Self-pay | Admitting: Internal Medicine

## 2022-03-04 DIAGNOSIS — Z0001 Encounter for general adult medical examination with abnormal findings: Secondary | ICD-10-CM | POA: Insufficient documentation

## 2022-03-07 ENCOUNTER — Telehealth: Payer: Self-pay | Admitting: Internal Medicine

## 2022-03-07 NOTE — Telephone Encounter (Signed)
Pt is requesting a call from a [facility] or Nurse to review his labwork with him. He can't access his mychart app due to the age of his phone and iPad.   Please call pt at:  [phone]

## 2022-03-07 NOTE — Telephone Encounter (Signed)
Pt has bee informed of the below result note dated 11/14:   "Marisa -   There were some red blood cells in your urine. I would like to recheck this in 3 months. Be sure to take xarelto and a baby aspirin to keep the arteries open. Your kidney function is stable.The other labs were normal.   Scarlette Calico, MD"   Pt expressed understanding. Pt has bee schedule for 54mofollow up.

## 2022-03-25 ENCOUNTER — Ambulatory Visit (INDEPENDENT_AMBULATORY_CARE_PROVIDER_SITE_OTHER): Payer: Medicare PPO | Admitting: *Deleted

## 2022-03-25 DIAGNOSIS — Z0000 Encounter for general adult medical examination without abnormal findings: Secondary | ICD-10-CM | POA: Diagnosis not present

## 2022-03-25 NOTE — Progress Notes (Signed)
Subjective:   Christopher Rubio. is a 88 y.o. male who presents for Medicare Annual/Subsequent preventive examination. I connected with  Christopher Rubio. on 03/25/22 by a audio enabled telemedicine application and verified that I am speaking with the correct person using two identifiers.  Patient Location: Home  Provider Location: Home Office  I discussed the limitations of evaluation and management by telemedicine. The patient expressed understanding and agreed to proceed.  Review of Systems    Deferred to PCP Cardiac Risk Factors include: advanced age (>76mn, >>36women);dyslipidemia;hypertension;male gender     Objective:    There were no vitals filed for this visit. There is no height or weight on file to calculate BMI.     03/25/2022   10:27 AM 03/19/2021   10:43 AM 02/06/2020    8:52 AM 02/05/2019    8:28 AM 01/25/2018    1:11 PM 01/19/2017    8:43 AM 11/18/2015    8:53 AM  Advanced Directives  Does Patient Have a Medical Advance Directive? Yes Yes Yes Yes Yes Yes Yes  Type of AParamedicof [place] will Living will;Healthcare Power of Attorney Living will;Healthcare Power of ADue WestLiving will [place]ar[place] will [place] will   Does patient want to make changes to medical advance directive? No - Patient declined No - Patient declined       Copy of [place] Chart? No - copy requested No - copy requested No - copy requested No - copy requested No - copy requested No - copy requested   Would patient like information on creating a medical advance directive?       Yes - Educational materials given    Current Medications (verified) Outpatient Encounter Medications as of 03/25/2022  Medication Sig   amLODipine (NORVASC) 5 MG tablet TAKE 1 TABLET(5 MG) BY MOUTH DAILY   aspirin EC 81 MG tablet Take 1 tablet (81 mg total) by mouth  daily. Swallow whole.   Cholecalciferol (VITAMIN D) 2000 UNITS CAPS Take 2,000 Units by mouth daily.   ezetimibe (ZETIA) 10 MG tablet TAKE 1 TABLET(10 MG) BY MOUTH DAILY   rivaroxaban (XARELTO) 2.5 MG TABS tablet TAKE 1 TABLET(2.5 MG) BY MOUTH TWICE DAILY   silodosin (RAPAFLO) 4 MG CAPS capsule TAKE 1 CAPSULE(4 MG) BY MOUTH DAILY WITH BREAKFAST   tacrolimus (PROTOPIC) 0.1 % ointment Apply topically 2 (two) times daily.   thiamine (VITAMIN B-1) 50 MG tablet Take 1 tablet (50 mg total) by mouth daily.   No facility-administered encounter medications on file as of 03/25/2022.    Allergies (verified) Keflex [cephalexin], Statins, Codeine, Neomycin-bacitracin zn-polymyx, Penicillins, and Prednisone   History: Past Medical History:  Diagnosis Date   Anemia    Atrial fibrillation (HCC)    Basal cell carcinoma of skin, site unspecified    Benign neoplasm of colon    Contact dermatitis and other eczema, due to unspecified cause    Diverticulosis of colon (without mention of hemorrhage)    Extrinsic asthma, unspecified    Gout, unspecified    Impotence of organic origin    Other and unspecified hyperlipidemia    Shortness of breath 08/19/2019   Unspecified essential hypertension    Past Surgical History:  Procedure Laterality Date   TONSILLECTOMY     Family History  Problem Relation Age of Onset   Coronary artery disease Father 951      deceased  Heart attack Father    Heart disease Father    Stroke Mother    Hypertension Mother    Cancer Brother    Bladder Cancer Brother    Diabetes Neg Hx    Social History   Socioeconomic History   Marital status: Widowed    Spouse name: Not on file   Number of children: 2   Years of education: Not on file   Highest education level: Not on file  Occupational History   Occupation: retired Pharmacist, hospital  Tobacco Use   Smoking status: Every Day    Packs/day: 0.50    Years: 60.00    Total pack years: 30.00    Types: Cigarettes    Passive  exposure: Current   Smokeless tobacco: Never  Vaping Use   Vaping Use: Never used  Substance and Sexual Activity   Alcohol use: Yes    Alcohol/week: 10.0 standard drinks of alcohol    Types: 10 Cans of beer per week    Comment: Rarely drink anything   Drug use: No   Sexual activity: Not Currently  Other Topics Concern   Not on file  Social History Narrative   HSG, [place]; [employer]. Married '62. 2 daughters - '67, '70: no grandchildren. Retired-college Music therapist. Enjoys retirement: golf, gardening, woodworking. Marriage in good health            Social Determinants of Health   Financial Resource Strain: Low Risk  (03/25/2022)   Overall Financial Resource Strain (CARDIA)    Difficulty of Paying Living Expenses: Not hard at all  Food Insecurity: No Food Insecurity (03/25/2022)   Hunger Vital Sign    Worried About Running Out of Food in the Last Year: Never true    Ran Out of Food in the Last Year: Never true  Transportation Needs: No Transportation Needs (03/25/2022)   PRAPARE - Hydrologist (Medical): No    Lack of Transportation (Non-Medical): No  Physical Activity: Insufficiently Active (03/25/2022)   Exercise Vital Sign    Days of Exercise per Week: 4 days    Minutes of Exercise per Session: 30 min  Stress: No Stress Concern Present (03/25/2022)   [place]    Feeling of Stress : Not at all  Social Connections: Moderately Isolated (03/25/2022)   Social Connection and Isolation Panel [NHANES]    Frequency of Communication with Friends and Family: Three times a week    Frequency of Social Gatherings with Friends and Family: Twice a week    Attends Religious Services: Never    Marine scientist or Organizations: Yes    Attends Music therapist: More than 4 times per year    Marital Status: Widowed    Tobacco  Counseling Ready to quit: No Counseling given: Not Answered   Clinical Intake:  Pre-visit preparation completed: Yes  Pain : No/denies pain     Nutritional Status: BMI of 19-24  Normal Nutritional Risks: None Diabetes: No  How often do you need to have someone help you when you read instructions, pamphlets, or other written materials from your doctor or pharmacy?: 1 - Never  Diabetic?No  Interpreter Needed?: No  Information entered by :: Emelia Loron RN   Activities of Daily Living    03/25/2022   12:07 PM  In your present state of health, do you have any difficulty performing the following activities:  Hearing? 0  Vision?  0  Difficulty concentrating or making decisions? 0  Walking or climbing stairs? 0  Dressing or bathing? 0  Doing errands, shopping? 0  Preparing Food and eating ? N  Using the Toilet? N  In the past six months, have you accidently leaked urine? N  Do you have problems with loss of bowel control? N  Managing your Medications? N  Managing your Finances? N  Housekeeping or managing your Housekeeping? N    Patient Care Team: [name], MD as PCP - General (Internal Medicine) Early, Arvilla Meres, MD as Consulting Physician (Vascular Surgery)  Indicate any recent Medical Services you may have received from other than [facility] providers in the past year (date may be approximate).     Assessment:   This is a routine wellness examination for [place]cock.  Hearing/Vision screen No results found.  Dietary issues and exercise activities discussed: Current Exercise Habits: Home exercise routine, Type of exercise: walking, Time (Minutes): 30, Frequency (Times/Week): 3, Weekly Exercise (Minutes/Week): 90, Intensity: Mild, Exercise limited by: None identified   Goals Addressed             This Visit's Progress    Patient Stated       Stay upright and be as active as possible.       Depression Screen    03/25/2022   10:29 AM 03/19/2021   10:42 AM  02/06/2020    8:51 AM 08/15/2019    8:41 AM 02/05/2019    8:29 AM 01/25/2018    1:12 PM 01/19/2017    8:46 AM  PHQ 2/9 Scores  PHQ - 2 Score 1 0 0 0 0 0 1  PHQ- 9 Score       2    Fall Risk    03/25/2022   10:29 AM 03/19/2021   10:44 AM 02/06/2020    8:52 AM 08/15/2019    8:41 AM 02/05/2019    8:28 AM  Fall Risk   Falls in the past year? 0 0 0 0 0  Number falls in past yr: 0 0 0 0 0  Injury with Fall? 0 0 0 0 0  Risk for fall due to : History of fall(s) No Fall Risks No Fall Risks Impaired balance/gait   Follow up Falls evaluation completed Falls prevention discussed Falls evaluation completed;Education provided Falls evaluation completed     FALL RISK PREVENTION PERTAINING TO THE HOME:  Any stairs in or around the home? Yes  If so, are there any without handrails? No  Home free of loose throw rugs in walkways, pet beds, electrical cords, etc? Yes  Adequate lighting in your home to reduce risk of falls? Yes   ASSISTIVE DEVICES UTILIZED TO PREVENT FALLS:  Life alert? No  Use of a cane, walker or w/c? Yes  Grab bars in the bathroom? Yes  Shower chair or bench in shower? No  Elevated toilet seat or a handicapped toilet? No   Cognitive Function:    01/25/2018    1:12 PM 01/19/2017    9:24 AM  MMSE - Mini Mental State Exam  Orientation to time 5 5  Orientation to Place 5 5  Registration 3 3  Attention/ Calculation 5 5  Recall 2 2  Language- name 2 objects 2 2  Language- repeat 1 1  Language- follow 3 step command 3 3  Language- read & follow direction 1 1  Write a sentence 1 1  Copy design 1 1  Total score 29 29  03/25/2022   10:27 AM 02/06/2020    8:54 AM 02/05/2019    8:33 AM  6CIT Screen  What Year? 0 points 0 points 0 points  What month? 0 points 0 points 0 points  What time? 0 points 0 points 0 points  Count back from 20 0 points 0 points 0 points  Months in reverse 0 points 0 points 0 points  Repeat phrase 0 points 0 points 0 points  Total  Score 0 points 0 points 0 points    Immunizations Immunization History  Administered Date(s) Administered   Fluad Quad(high Dose 65+) 02/05/2019, 02/06/2020, 03/19/2021, 02/22/2022   Influenza, High Dose Seasonal PF 12/11/2014, 01/26/2016, 01/19/2017, 01/25/2018   Influenza,inj,Quad PF,6+ Mos 02/25/2013, 12/30/2013   Moderna Sars-Covid-2 Vaccination 06/27/2019, 07/25/2019   Pneumococcal Conjugate-13 03/13/2014   Pneumococcal Polysaccharide-23 12/26/2012, 12/03/2019   Td 08/10/2008   Tdap 12/03/2019   Zoster, Live 02/13/2010    TDAP status: Up to date  Flu Vaccine status: Up to date  Pneumococcal vaccine status: Up to date  Covid-19 vaccine status: Information provided on how to obtain vaccines.   Qualifies for Shingles Vaccine? Yes   Zostavax completed No   Shingrix Completed?: No.    Education has been provided regarding the importance of this vaccine. Patient has been advised to call insurance company to determine out of pocket expense if they have not yet received this vaccine. Advised may also receive vaccine at local pharmacy or Health Dept. Verbalized acceptance and understanding.  Screening Tests Health Maintenance  Topic Date Due   Zoster Vaccines- Shingrix (1 of 2) Never done   COVID-19 Vaccine (3 - 2023-24 season) 12/10/2021   Medicare Annual Wellness (AWV)  03/26/2023   DTaP/Tdap/Td (3 - Td or Tdap) 12/02/2029   Pneumonia Vaccine 35+ Years old  Completed   INFLUENZA VACCINE  Completed   HPV VACCINES  Aged Out    Health Maintenance  Health Maintenance Due  Topic Date Due   Zoster Vaccines- Shingrix (1 of 2) Never done   COVID-19 Vaccine (3 - 2023-24 season) 12/10/2021    Colorectal cancer screening: No longer required.   Lung Cancer Screening: (Low Dose CT Chest recommended if Age 27-80 years, 30 pack-year currently smoking OR have quit w/in 15years.) does not qualify.   Additional Screening:  Hepatitis C Screening: does qualify; Completed education  provided  Vision Screening: Recommended annual ophthalmology exams for early detection of glaucoma and other disorders of the eye. Is the patient up to date with their annual eye exam?  No , education provided Who is the provider or what is the name of the office in which the patient attends annual eye exams? Dr. Satira Sark If pt is not established with a provider, would they like to be referred to a provider to establish care? No .   Dental Screening: Recommended annual dental exams for proper oral hygiene  Community Resource Referral / Chronic Care Management: CRR required this visit?  No   CCM required this visit?  No      Plan:     I have personally reviewed and noted the following in the patient's chart:   Medical and social history Use of alcohol, tobacco or illicit drugs  Current medications and supplements including opioid prescriptions. Patient is not currently taking opioid prescriptions. Functional ability and status Nutritional status Physical activity Advanced directives List of other physicians Hospitalizations, surgeries, and ER visits in previous 12 months Vitals Screenings to include cognitive, depression, and falls Referrals and appointments  In addition, I have reviewed and discussed with patient certain preventive protocols, quality metrics, and best practice recommendations. A written personalized care plan for preventive services as well as general preventive health recommendations were provided to patient.     Michiel Cowboy, RN   03/25/2022   Nurse Notes:  Mr. Flinchbaugh , Thank you for taking time to come for your Medicare Wellness Visit. I appreciate your ongoing commitment to your health goals. Please review the following plan we discussed and let me know if I can assist you in the future.   These are the goals we discussed:  Goals       Patient Stated     Patient Stated (pt-stated)      Continue to stay independent, physically and socially  involved.      Other     Exercise 150 minutes per week (moderate activity)      Will start weight program at home Loss of muscle tone; recommended exercise with the 5 lb weights he has or can get exercise rubber bands or stretch bands        patient      Will add some calories to diet in the am Peanut butter; banana; cereal; blueberries  Gain weight Add honey; Eat nuts; (mixed) almonds or walnuts  Ice cream;  Protein bar         Patient Stated      Patient Stated       Stay active physically and socially. Continue to eat healthy and exercise.      Patient Stated      Stay upright and be as active as possible.       Stay as healthy and as independent as possible      Continue to exercise, eat healthy, and take care of me the best I can.       will continue yoga      Will continue to embrace yoga and experiment with other forms of relaxation        This is a list of the screening recommended for you and due dates:  Health Maintenance  Topic Date Due   Zoster (Shingles) Vaccine (1 of 2) Never done   COVID-19 Vaccine (3 - 2023-24 season) 12/10/2021   Medicare Annual Wellness Visit  03/26/2023   DTaP/Tdap/Td vaccine (3 - Td or Tdap) 12/02/2029   Pneumonia Vaccine  Completed   Flu Shot  Completed   HPV Vaccine  Aged Out

## 2022-03-25 NOTE — Patient Instructions (Signed)
Health Maintenance, Male Adopting a healthy lifestyle and getting preventive care are important in promoting health and wellness. Ask your health care provider about: The right schedule for you to have regular tests and exams. Things you can do on your own to prevent diseases and keep yourself healthy. What should I know about diet, weight, and exercise? Eat a healthy diet  Eat a diet that includes plenty of vegetables, fruits, low-fat dairy products, and lean protein. Do not eat a lot of foods that are high in solid fats, added sugars, or sodium. Maintain a healthy weight Body mass index (BMI) is a measurement that can be used to identify possible weight problems. It estimates body fat based on height and weight. Your health care provider can help determine your BMI and help you achieve or maintain a healthy weight. Get regular exercise Get regular exercise. This is one of the most important things you can do for your health. Most adults should: Exercise for at least 150 minutes each week. The exercise should increase your heart rate and make you sweat (moderate-intensity exercise). Do strengthening exercises at least twice a week. This is in addition to the moderate-intensity exercise. Spend less time sitting. Even light physical activity can be beneficial. Watch cholesterol and blood lipids Have your blood tested for lipids and cholesterol at 86 years of age, then have this test every 5 years. You may need to have your cholesterol levels checked more often if: Your lipid or cholesterol levels are high. You are older than 86 years of age. You are at high risk for heart disease. What should I know about cancer screening? Many types of cancers can be detected early and may often be prevented. Depending on your health history and family history, you may need to have cancer screening at various ages. This may include screening for: Colorectal cancer. Prostate cancer. Skin cancer. Lung  cancer. What should I know about heart disease, diabetes, and high blood pressure? Blood pressure and heart disease High blood pressure causes heart disease and increases the risk of stroke. This is more likely to develop in people who have high blood pressure readings or are overweight. Talk with your health care provider about your target blood pressure readings. Have your blood pressure checked: Every 3-5 years if you are 18-39 years of age. Every year if you are 40 years old or older. If you are between the ages of 65 and 75 and are a current or former smoker, ask your health care provider if you should have a one-time screening for abdominal aortic aneurysm (AAA). Diabetes Have regular diabetes screenings. This checks your fasting blood sugar level. Have the screening done: Once every three years after age 45 if you are at a normal weight and have a low risk for diabetes. More often and at a younger age if you are overweight or have a high risk for diabetes. What should I know about preventing infection? Hepatitis B If you have a higher risk for hepatitis B, you should be screened for this virus. Talk with your health care provider to find out if you are at risk for hepatitis B infection. Hepatitis C Blood testing is recommended for: Everyone born from 1945 through 1965. Anyone with known risk factors for hepatitis C. Sexually transmitted infections (STIs) You should be screened each year for STIs, including gonorrhea and chlamydia, if: You are sexually active and are younger than 86 years of age. You are older than 86 years of age and your   health care provider tells you that you are at risk for this type of infection. Your sexual activity has changed since you were last screened, and you are at increased risk for chlamydia or gonorrhea. Ask your health care provider if you are at risk. Ask your health care provider about whether you are at high risk for HIV. Your health care provider  may recommend a prescription medicine to help prevent HIV infection. If you choose to take medicine to prevent HIV, you should first get tested for HIV. You should then be tested every 3 months for as long as you are taking the medicine. Follow these instructions at home: Alcohol use Do not drink alcohol if your health care provider tells you not to drink. If you drink alcohol: Limit how much you have to 0-2 drinks a day. Know how much alcohol is in your drink. In the [place] one drink equals one 12 oz bottle of beer (355 mL), one 5 oz glass of Giancarlo Askren (148 mL), or one 1 oz glass of hard liquor (44 mL). Lifestyle Do not use any products that contain nicotine or tobacco. These products include cigarettes, chewing tobacco, and vaping devices, such as e-cigarettes. If you need help quitting, ask your health care provider. Do not use street drugs. Do not share needles. Ask your health care provider for help if you need support or information about quitting drugs. General instructions Schedule regular health, dental, and eye exams. Stay current with your vaccines. Tell your health care provider if: You often feel depressed. You have ever been abused or do not feel safe at home. Summary Adopting a healthy lifestyle and getting preventive care are important in promoting health and wellness. Follow your health care provider's instructions about healthy diet, exercising, and getting tested or screened for diseases. Follow your health care provider's instructions on monitoring your cholesterol and blood pressure. This information is not intended to replace advice given to you by your health care provider. Make sure you discuss any questions you have with your health care provider. Document Revised: 08/17/2020 Document Reviewed: 08/17/2020 Elsevier Patient Education  2023 [employer]  

## 2022-04-27 DIAGNOSIS — F17200 Nicotine dependence, unspecified, uncomplicated: Secondary | ICD-10-CM | POA: Diagnosis not present

## 2022-04-27 DIAGNOSIS — I8291 Chronic embolism and thrombosis of unspecified vein: Secondary | ICD-10-CM | POA: Diagnosis not present

## 2022-04-27 DIAGNOSIS — N400 Enlarged prostate without lower urinary tract symptoms: Secondary | ICD-10-CM | POA: Diagnosis not present

## 2022-04-27 DIAGNOSIS — I10 Essential (primary) hypertension: Secondary | ICD-10-CM | POA: Diagnosis not present

## 2022-04-27 DIAGNOSIS — Z87892 Personal history of anaphylaxis: Secondary | ICD-10-CM | POA: Diagnosis not present

## 2022-04-27 DIAGNOSIS — E785 Hyperlipidemia, unspecified: Secondary | ICD-10-CM | POA: Diagnosis not present

## 2022-04-27 DIAGNOSIS — J309 Allergic rhinitis, unspecified: Secondary | ICD-10-CM | POA: Diagnosis not present

## 2022-04-27 DIAGNOSIS — Z7901 Long term (current) use of anticoagulants: Secondary | ICD-10-CM | POA: Diagnosis not present

## 2022-04-27 DIAGNOSIS — M109 Gout, unspecified: Secondary | ICD-10-CM | POA: Diagnosis not present

## 2022-05-26 ENCOUNTER — Ambulatory Visit: Payer: Medicare PPO | Admitting: Internal Medicine

## 2022-06-06 ENCOUNTER — Ambulatory Visit: Payer: Medicare PPO | Admitting: Internal Medicine

## 2022-06-06 ENCOUNTER — Encounter: Payer: Self-pay | Admitting: Internal Medicine

## 2022-06-06 VITALS — BP 144/66 | HR 62 | Temp 98.2°F | Resp 16 | Ht 66.0 in | Wt 142.0 lb

## 2022-06-06 DIAGNOSIS — N1831 Chronic kidney disease, stage 3a: Secondary | ICD-10-CM | POA: Diagnosis not present

## 2022-06-06 DIAGNOSIS — I10 Essential (primary) hypertension: Secondary | ICD-10-CM | POA: Diagnosis not present

## 2022-06-06 DIAGNOSIS — R3121 Asymptomatic microscopic hematuria: Secondary | ICD-10-CM

## 2022-06-06 LAB — URINALYSIS, ROUTINE W REFLEX MICROSCOPIC
Bilirubin Urine: NEGATIVE
Ketones, ur: NEGATIVE
Leukocytes,Ua: NEGATIVE
Nitrite: NEGATIVE
Specific Gravity, Urine: 1.025 (ref 1.000–1.030)
Total Protein, Urine: 100 — AB
Urine Glucose: NEGATIVE
Urobilinogen, UA: 0.2 (ref 0.0–1.0)
pH: 6 (ref 5.0–8.0)

## 2022-06-06 NOTE — Patient Instructions (Signed)

## 2022-06-06 NOTE — Progress Notes (Signed)
Subjective:  Patient ID: Christopher Aid., Christopher Rubio    DOB: [birth year]  Age: 89 y.o. MRN: [id number]  CC: No chief complaint on file.   HPI Christopher Aid. presents for ***  Outpatient Medications Prior to Visit  Medication Sig Dispense Refill   amLODipine (NORVASC) 5 MG tablet TAKE 1 TABLET(5 MG) BY MOUTH DAILY 90 tablet 1   aspirin EC 81 MG tablet Take 1 tablet (81 mg total) by mouth daily. Swallow whole. 90 tablet 1   Cholecalciferol (VITAMIN D) 2000 UNITS CAPS Take 2,000 Units by mouth daily.     ezetimibe (ZETIA) 10 MG tablet TAKE 1 TABLET(10 MG) BY MOUTH DAILY 90 tablet 1   rivaroxaban (XARELTO) 2.5 MG TABS tablet TAKE 1 TABLET(2.5 MG) BY MOUTH TWICE DAILY 180 tablet 1   silodosin (RAPAFLO) 4 MG CAPS capsule TAKE 1 CAPSULE(4 MG) BY MOUTH DAILY WITH BREAKFAST 90 capsule 1   tacrolimus (PROTOPIC) 0.1 % ointment Apply topically 2 (two) times daily. 100 g 3   thiamine (VITAMIN B-1) 50 MG tablet Take 1 tablet (50 mg total) by mouth daily. 90 tablet 1   No facility-administered medications prior to visit.    ROS Review of Systems  Objective:  BP (!) 144/66 (BP Location: Left Arm, Patient Position: Sitting, Cuff Size: Large)   Pulse 62   Temp 98.2 F (36.8 C) (Oral)   Resp 16   Ht '5\' 6"'$  (1.676 m)   Wt 142 lb (64.4 kg)   SpO2 93%   BMI 22.92 kg/m   BP Readings from Last 3 Encounters:  06/06/22 (!) 144/66  02/22/22 (!) 148/86  04/13/21 (!) 146/68    Wt Readings from Last 3 Encounters:  06/06/22 142 lb (64.4 kg)  02/22/22 137 lb (62.1 kg)  04/13/21 144 lb (65.3 kg)    Physical Exam Vitals reviewed.  Constitutional:      Appearance: Normal appearance.  HENT:     Mouth/Throat:     Mouth: Mucous membranes are moist.  Eyes:     General: No scleral icterus.    Conjunctiva/sclera: Conjunctivae normal.  Cardiovascular:     Rate and Rhythm: Normal rate and regular rhythm.     Heart sounds: S1 normal and S2 normal. Murmur heard.     Systolic murmur is  present with a grade of 3/6.     No diastolic murmur is present.     No friction rub. No gallop.  Pulmonary:     Effort: Pulmonary effort is normal.     Breath sounds: No stridor. No wheezing, rhonchi or rales.  Abdominal:     General: Abdomen is flat.     Palpations: There is no mass.     Tenderness: There is no abdominal tenderness. There is no guarding.     Hernia: No hernia is present.  Musculoskeletal:     Cervical back: Neck supple. No tenderness.     Right lower leg: No edema.     Left lower leg: No edema.  Skin:    General: Skin is warm and dry.  Neurological:     General: No focal deficit present.     Mental Status: He is alert.  Psychiatric:        Mood and Affect: Mood normal.        Behavior: Behavior normal.     Lab Results  Component Value Date   WBC 8.5 02/22/2022   HGB 13.7 02/22/2022   HCT 40.7 02/22/2022   PLT 219.0  02/22/2022   GLUCOSE 99 02/22/2022   CHOL 199 02/22/2022   TRIG 162.0 (H) 02/22/2022   HDL 37.50 (L) 02/22/2022   LDLDIRECT 95.0 08/15/2019   LDLCALC 129 (H) 02/22/2022   ALT 15 02/22/2022   AST 18 02/22/2022   NA 139 02/22/2022   K 4.3 02/22/2022   CL 105 02/22/2022   CREATININE 1.40 02/22/2022   BUN 25 (H) 02/22/2022   CO2 29 02/22/2022   TSH 4.36 02/22/2022   PSA 2.40 12/11/2014    DG Foot Complete Right  Result Date: 03/02/2020 CLINICAL DATA:  Acute right foot pain and swelling without known injury. EXAM: RIGHT FOOT COMPLETE - 3+ VIEW COMPARISON:  None. FINDINGS: There is no evidence of fracture or dislocation. Erosion or lucency is seen involving the distal portion of the first metatarsal with adjacent soft tissue swelling consistent with gout. Soft tissues are unremarkable. IMPRESSION: Findings consistent with gout involving the distal portion of the first metatarsal. No acute fracture or dislocation is noted. Electronically Signed   By: Marijo Conception M.D.   On: 03/02/2020 16:26    Assessment & Plan:   Diagnoses and all  orders for this visit:  Asymptomatic microscopic hematuria -     Urinalysis, Routine w reflex microscopic; Future  Primary hypertension  Stage 3a chronic kidney disease ([place])   I am having Christopher L. Tollie Eth. maintain his Vitamin D, tacrolimus, silodosin, ezetimibe, Xarelto, amLODipine, thiamine, and aspirin EC.  No orders of the defined types were placed in this encounter.    Follow-up: Return in about 6 months (around 12/05/2022).  Scarlette Calico, MD

## 2022-06-14 ENCOUNTER — Telehealth: Payer: Self-pay | Admitting: Internal Medicine

## 2022-06-14 DIAGNOSIS — I739 Peripheral vascular disease, unspecified: Secondary | ICD-10-CM

## 2022-06-14 DIAGNOSIS — E785 Hyperlipidemia, unspecified: Secondary | ICD-10-CM

## 2022-06-14 DIAGNOSIS — I10 Essential (primary) hypertension: Secondary | ICD-10-CM

## 2022-06-14 MED ORDER — AMLODIPINE BESYLATE 5 MG PO TABS: ORAL_TABLET | ORAL | 1 refills | Status: DC

## 2022-06-14 MED ORDER — EZETIMIBE 10 MG PO TABS: 10.0000 mg | ORAL_TABLET | Freq: Every day | ORAL | 1 refills | Status: DC

## 2022-06-14 NOTE — Telephone Encounter (Signed)
Refills has been sent to [facility].Marland KitchenJohny Chess

## 2022-06-14 NOTE — Telephone Encounter (Signed)
Caller & Relationship to patient:  self   Call back number:  [phone]   Date of last office visit:06/06/2020   Date of next office visit:   Medication(s) to be refilled:  amlodopine 5 mg, Exetimibe 10 mg.        Preferred Pharmacy:  Krist[facility] Oppenheim at [facility]

## 2022-08-07 ENCOUNTER — Other Ambulatory Visit: Payer: Self-pay | Admitting: Internal Medicine

## 2022-08-07 DIAGNOSIS — I10 Essential (primary) hypertension: Secondary | ICD-10-CM

## 2022-08-07 DIAGNOSIS — I739 Peripheral vascular disease, unspecified: Secondary | ICD-10-CM

## 2022-08-07 DIAGNOSIS — N138 Other obstructive and reflux uropathy: Secondary | ICD-10-CM

## 2022-08-07 DIAGNOSIS — E785 Hyperlipidemia, unspecified: Secondary | ICD-10-CM

## 2022-10-06 DIAGNOSIS — L821 Other seborrheic keratosis: Secondary | ICD-10-CM | POA: Diagnosis not present

## 2022-10-06 DIAGNOSIS — C44311 Basal cell carcinoma of skin of nose: Secondary | ICD-10-CM | POA: Diagnosis not present

## 2022-10-06 DIAGNOSIS — L570 Actinic keratosis: Secondary | ICD-10-CM | POA: Diagnosis not present

## 2022-10-06 DIAGNOSIS — D492 Neoplasm of unspecified behavior of bone, soft tissue, and skin: Secondary | ICD-10-CM | POA: Diagnosis not present

## 2022-11-07 DIAGNOSIS — C44311 Basal cell carcinoma of skin of nose: Secondary | ICD-10-CM | POA: Diagnosis not present

## 2023-01-09 DIAGNOSIS — L111 Transient acantholytic dermatosis [Grover]: Secondary | ICD-10-CM | POA: Diagnosis not present

## 2023-01-09 DIAGNOSIS — Z08 Encounter for follow-up examination after completed treatment for malignant neoplasm: Secondary | ICD-10-CM | POA: Diagnosis not present

## 2023-01-09 DIAGNOSIS — D1801 Hemangioma of skin and subcutaneous tissue: Secondary | ICD-10-CM | POA: Diagnosis not present

## 2023-01-09 DIAGNOSIS — Z85828 Personal history of other malignant neoplasm of skin: Secondary | ICD-10-CM | POA: Diagnosis not present

## 2023-01-09 DIAGNOSIS — D225 Melanocytic nevi of trunk: Secondary | ICD-10-CM | POA: Diagnosis not present

## 2023-01-09 DIAGNOSIS — Z872 Personal history of diseases of the skin and subcutaneous tissue: Secondary | ICD-10-CM | POA: Diagnosis not present

## 2023-01-09 DIAGNOSIS — I872 Venous insufficiency (chronic) (peripheral): Secondary | ICD-10-CM | POA: Diagnosis not present

## 2023-01-09 DIAGNOSIS — L821 Other seborrheic keratosis: Secondary | ICD-10-CM | POA: Diagnosis not present

## 2023-01-09 DIAGNOSIS — L814 Other melanin hyperpigmentation: Secondary | ICD-10-CM | POA: Diagnosis not present

## 2023-01-31 ENCOUNTER — Other Ambulatory Visit: Payer: Self-pay | Admitting: Internal Medicine

## 2023-01-31 DIAGNOSIS — I10 Essential (primary) hypertension: Secondary | ICD-10-CM

## 2023-01-31 DIAGNOSIS — I739 Peripheral vascular disease, unspecified: Secondary | ICD-10-CM

## 2023-01-31 DIAGNOSIS — E785 Hyperlipidemia, unspecified: Secondary | ICD-10-CM

## 2023-02-02 ENCOUNTER — Other Ambulatory Visit: Payer: Self-pay | Admitting: Internal Medicine

## 2023-02-02 DIAGNOSIS — E785 Hyperlipidemia, unspecified: Secondary | ICD-10-CM

## 2023-02-02 DIAGNOSIS — I10 Essential (primary) hypertension: Secondary | ICD-10-CM

## 2023-02-02 DIAGNOSIS — I739 Peripheral vascular disease, unspecified: Secondary | ICD-10-CM

## 2023-02-06 ENCOUNTER — Telehealth: Payer: Self-pay | Admitting: Internal Medicine

## 2023-02-06 NOTE — Telephone Encounter (Signed)
Pt stated that his medications on his med list has been denied and want to know why.. Will we know that answer and if so can someone please advise.

## 2023-02-06 NOTE — Telephone Encounter (Signed)
Unable to reach patient. LMTRC. IF patient calls back an I'm unable to speak with him please let him know his medication was denied due to him needing to see Dr. Yetta Barre for an overdue follow up appointment. Patient Is scheduled to see Dr. Yetta Barre on the 30th and will receive refills at his appointment.

## 2023-02-07 NOTE — Telephone Encounter (Signed)
Pt is coming in Thursday at 1:00 for med refills.

## 2023-02-08 ENCOUNTER — Ambulatory Visit: Payer: Medicare PPO

## 2023-02-08 VITALS — BP 140/59 | HR 56 | Temp 97.9°F | Ht 66.0 in | Wt 134.6 lb

## 2023-02-08 DIAGNOSIS — Z23 Encounter for immunization: Secondary | ICD-10-CM | POA: Diagnosis not present

## 2023-02-08 DIAGNOSIS — Z0000 Encounter for general adult medical examination without abnormal findings: Secondary | ICD-10-CM

## 2023-02-08 NOTE — Progress Notes (Signed)
Subjective:   Christopher Rubio. is a 89 y.o. male who presents for Medicare Annual/Subsequent preventive examination.  Visit Complete: In person   Cardiac Risk Factors include: advanced age (>40men, >31 women);family history of premature cardiovascular disease;hypertension;male gender;dyslipidemia;smoking/ tobacco exposure     Objective:    Today's Vitals   02/08/23 1004 02/08/23 1027 02/08/23 1033  BP: (!) 170/70 (!) 150/60 (!) 140/59  Pulse: (!) 56    Temp: 97.9 F (36.6 C)    TempSrc: Temporal    SpO2: 97%    Weight: 134 lb 9.6 oz (61.1 kg)    Height: 5\' 6"  (1.676 m)    PainSc: 0-No pain     Body mass index is 21.73 kg/m.     02/08/2023   10:07 AM 03/25/2022   10:27 AM 03/19/2021   10:43 AM 02/06/2020    8:52 AM 02/05/2019    8:28 AM 01/25/2018    1:11 PM 01/19/2017    8:43 AM  Advanced Directives  Does Patient Have a Medical Advance Directive? Yes Yes Yes Yes Yes Yes Yes  Type of Estate agent of [place];Living will Healthcare Power of [place];Living will Living will;Healthcare Power of Attorney Living will;Healthcare Power of [employer] Power of [place];Living will Healthcare Power of [place];Living will Healthcare Power of [place];Living will  Does patient want to make changes to medical advance directive?  No - Patient declined No - Patient declined      Copy of Healthcare Power of Attorney in Chart? No - copy requested No - copy requested No - copy requested No - copy requested No - copy requested No - copy requested No - copy requested    Current Medications (verified) Outpatient Encounter Medications as of 02/08/2023  Medication Sig   amLODipine (NORVASC) 5 MG tablet TAKE 1 TABLET(5 MG) BY MOUTH DAILY   Cholecalciferol (VITAMIN D) 2000 UNITS CAPS Take 2,000 Units by mouth daily.   ezetimibe (ZETIA) 10 MG tablet TAKE 1 TABLET(10 MG) BY MOUTH DAILY   rivaroxaban (XARELTO) 2.5 MG TABS tablet TAKE 1 TABLET(2.5 MG) BY  MOUTH TWICE DAILY   silodosin (RAPAFLO) 4 MG CAPS capsule TAKE 1 CAPSULE(4 MG) BY MOUTH DAILY WITH BREAKFAST   tacrolimus (PROTOPIC) 0.1 % ointment Apply topically 2 (two) times daily.   thiamine (VITAMIN B-1) 50 MG tablet Take 1 tablet (50 mg total) by mouth daily.   aspirin EC 81 MG tablet Take 1 tablet (81 mg total) by mouth daily. Swallow whole. (Patient not taking: Reported on 02/08/2023)   No facility-administered encounter medications on file as of 02/08/2023.    Allergies (verified) Keflex [cephalexin], Statins, Codeine, Neomycin-bacitracin zn-polymyx, Penicillins, and Prednisone   History: Past Medical History:  Diagnosis Date   Anemia    Atrial fibrillation (HCC)    Basal cell carcinoma of skin, site unspecified    Benign neoplasm of colon    Contact dermatitis and other eczema, due to unspecified cause    Diverticulosis of colon (without mention of hemorrhage)    Extrinsic asthma, unspecified    Gout, unspecified    Impotence of organic origin    Other and unspecified hyperlipidemia    Shortness of breath 08/19/2019   Unspecified essential hypertension    Past Surgical History:  Procedure Laterality Date   TONSILLECTOMY     Family History  Problem Relation Age of Onset   Coronary artery disease Father 16       deceased   Heart attack Father    Heart  disease Father    Stroke Mother    Hypertension Mother    Cancer Brother    Bladder Cancer Brother    Diabetes Neg Hx    Social History   Socioeconomic History   Marital status: Widowed    Spouse name: Not on file   Number of children: 2   Years of education: Not on file   Highest education level: Not on file  Occupational History   Occupation: retired Runner, broadcasting/film/video  Tobacco Use   Smoking status: Every Day    Current packs/day: 0.50    Average packs/day: 0.5 packs/day for 60.0 years (30.0 ttl pk-yrs)    Types: Cigarettes    Passive exposure: Current   Smokeless tobacco: Never  Vaping Use   Vaping status:  Never Used  Substance and Sexual Activity   Alcohol use: Yes    Alcohol/week: 10.0 standard drinks of alcohol    Types: 10 Cans of beer per week    Comment: Rarely drink anything   Drug use: No   Sexual activity: Not Currently  Other Topics Concern   Not on file  Social History Narrative   HSG, [employer], [employer]; Masters [employer]. Married '62. 2 daughters - '67, '70: no grandchildren. Retired-college Editor, commissioning. Enjoys retirement: golf, gardening, woodworking. Marriage in good health            Social Determinants of Health   Financial Resource Strain: Low Risk  (02/08/2023)   Overall Financial Resource Strain (CARDIA)    Difficulty of Paying Living Expenses: Not hard at all  Food Insecurity: No Food Insecurity (02/08/2023)   Hunger Vital Sign    Worried About Running Out of Food in the Last Year: Never true    Ran Out of Food in the Last Year: Never true  Transportation Needs: No Transportation Needs (02/08/2023)   PRAPARE - Administrator, Civil Service (Medical): No    Lack of Transportation (Non-Medical): No  Physical Activity: Sufficiently Active (02/08/2023)   Exercise Vital Sign    Days of Exercise per Week: 5 days    Minutes of Exercise per Session: 30 min  Stress: No Stress Concern Present (02/08/2023)   [employer] of Occupational Health - Occupational Stress Questionnaire    Feeling of Stress : Not at all  Social Connections: Moderately Isolated (02/08/2023)   Social Connection and Isolation Panel [NHANES]    Frequency of Communication with Friends and Family: Three times a week    Frequency of Social Gatherings with Friends and Family: Twice a week    Attends Religious Services: Never    Database administrator or Organizations: Yes    Attends Engineer, structural: More than 4 times per year    Marital Status: Widowed    Tobacco Counseling Ready to quit: Not Answered Counseling given: Not  Answered   Clinical Intake:  Pre-visit preparation completed: Yes  Pain : No/denies pain Pain Score: 0-No pain     BMI - recorded: 21.73 Nutritional Status: BMI of 19-24  Normal Nutritional Risks: None Diabetes: No  How often do you need to have someone help you when you read instructions, pamphlets, or other written materials from your doctor or pharmacy?: 1 - Never What is the last grade level you completed in school?: Master's Degree; Retired Financial controller Needed?: No  Information entered by :: [employer]. Aviv Lengacher, LPN.   Activities of Daily Living    02/08/2023   10:42 AM 03/25/2022  12:07 PM  In your present state of health, do you have any difficulty performing the following activities:  Hearing? 0 0  Vision? 0 0  Difficulty concentrating or making decisions? 0 0  Walking or climbing stairs? 0 0  Dressing or bathing? 0 0  Doing errands, shopping? 0 0  Preparing Food and eating ? N N  Using the Toilet? N N  In the past six months, have you accidently leaked urine? N N  Do you have problems with loss of bowel control? N N  Managing your Medications? N N  Managing your Finances? N N  Housekeeping or managing your Housekeeping? N N    Patient Care Team: Etta Grandchild, MD as PCP - General (Internal Medicine) Early, Kristen Loader, MD (Inactive) as Consulting Physician (Vascular Surgery)  Indicate any recent Medical Services you may have received from other than [facility] providers in the past year (date may be approximate).     Assessment:   This is a routine wellness examination for [place].  Hearing/Vision screen Hearing Screening - Comments:: Patient denied any hearing difficulty.   No hearing aids.  Vision Screening - Comments:: Patient does wear otc readers.  Eye exam done by: Janet Berlin, MD.    Goals Addressed             This Visit's Progress    Client understands the importance of follow-up with providers by attending  scheduled visits        Depression Screen    02/08/2023   10:05 AM 03/25/2022   10:29 AM 03/19/2021   10:42 AM 02/06/2020    8:51 AM 08/15/2019    8:41 AM 02/05/2019    8:29 AM 01/25/2018    1:12 PM  PHQ 2/9 Scores  PHQ - 2 Score 1 1 0 0 0 0 0  PHQ- 9 Score 3          Fall Risk    02/08/2023   10:42 AM 03/25/2022   10:29 AM 03/19/2021   10:44 AM 02/06/2020    8:52 AM 08/15/2019    8:41 AM  Fall Risk   Falls in the past year? 0 0 0 0 0  Number falls in past yr: 0 0 0 0 0  Injury with Fall? 0 0 0 0 0  Risk for fall due to : No Fall Risks History of fall(s) No Fall Risks No Fall Risks Impaired balance/gait  Follow up Falls prevention discussed Falls evaluation completed Falls prevention discussed Falls evaluation completed;Education provided Falls evaluation completed    MEDICARE RISK AT HOME: Medicare Risk at Home Any stairs in or around the home?: No If so, are there any without handrails?: No Home free of loose throw rugs in walkways, pet beds, electrical cords, etc?: Yes Adequate lighting in your home to reduce risk of falls?: Yes Life alert?: No Use of a cane, walker or w/c?: No Grab bars in the bathroom?: Yes Shower chair or bench in shower?: Yes Elevated toilet seat or a handicapped toilet?: Yes  TIMED UP AND GO:  Was the test performed?  Yes  Length of time to ambulate 10 feet: 8 sec Gait steady and fast without use of assistive device    Cognitive Function:    02/08/2023   10:07 AM 01/25/2018    1:12 PM 01/19/2017    9:24 AM 11/18/2015    8:56 AM  MMSE - Mini Mental State Exam  Not completed:    --  Orientation to time 5  5 5   Orientation to Place 5 5 5    Registration 3 3 3    Attention/ Calculation 5 5 5    Recall 3 2 2    Language- name 2 objects 2 2 2    Language- repeat 1 1 1    Language- follow 3 step command 3 3 3    Language- read & follow direction 1 1 1    Write a sentence 1 1 1    Copy design 1 1 1    Total score 30 29 29          02/08/2023    10:07 AM 03/25/2022   10:27 AM 02/06/2020    8:54 AM 02/05/2019    8:33 AM  6CIT Screen  What Year? 0 points 0 points 0 points 0 points  What month? 0 points 0 points 0 points 0 points  What time? 0 points 0 points 0 points 0 points  Count back from 20 0 points 0 points 0 points 0 points  Months in reverse 0 points 0 points 0 points 0 points  Repeat phrase 0 points 0 points 0 points 0 points  Total Score 0 points 0 points 0 points 0 points    Immunizations Immunization History  Administered Date(s) Administered   Fluad Quad(high Dose 65+) 02/05/2019, 02/06/2020, 03/19/2021, 02/22/2022   Influenza, High Dose Seasonal PF 12/11/2014, 01/26/2016, 01/19/2017, 01/25/2018   Influenza,inj,Quad PF,6+ Mos 02/25/2013, 12/30/2013   Moderna Sars-Covid-2 Vaccination 06/27/2019, 07/25/2019   Pneumococcal Conjugate-13 03/13/2014   Pneumococcal Polysaccharide-23 12/26/2012, 12/03/2019   Td 08/10/2008   Tdap 12/03/2019   Zoster, Live 02/13/2010    TDAP status: Up to date  Flu Vaccine status: Completed at today's visit  Pneumococcal vaccine status: Up to date  Covid-19 vaccine status: Completed vaccines  Qualifies for Shingles Vaccine? Yes   Zostavax completed Yes   Shingrix Completed?: No.    Education has been provided regarding the importance of this vaccine. Patient has been advised to call insurance company to determine out of pocket expense if they have not yet received this vaccine. Advised may also receive vaccine at local pharmacy or Health Dept. Verbalized acceptance and understanding.  Screening Tests Health Maintenance  Topic Date Due   Zoster Vaccines- Shingrix (1 of 2) 01/26/1986   INFLUENZA VACCINE  11/10/2022   COVID-19 Vaccine (3 - 2023-24 season) 12/11/2022   Medicare Annual Wellness (AWV)  02/08/2024   DTaP/Tdap/Td (3 - Td or Tdap) 12/02/2029   Pneumonia Vaccine 70+ Years old  Completed   HPV VACCINES  Aged Out    Health Maintenance  Health Maintenance Due   Topic Date Due   Zoster Vaccines- Shingrix (1 of 2) 01/26/1986   INFLUENZA VACCINE  11/10/2022   COVID-19 Vaccine (3 - 2023-24 season) 12/11/2022    Colorectal cancer screening: No longer required.   Lung Cancer Screening: (Low Dose CT Chest recommended if Age 48-80 years, 20 pack-year currently smoking OR have quit w/in 15years.) does not qualify.   Lung Cancer Screening Referral: no  Additional Screening:  Hepatitis C Screening: does not qualify.  Vision Screening: Recommended annual ophthalmology exams for early detection of glaucoma and other disorders of the eye. Is the patient up to date with their annual eye exam?  Yes  Who is the provider or what is the name of the office in which the patient attends annual eye exams? Janet Berlin, MD. If pt is not established with a provider, would they like to be referred to a provider to establish care? No .  Dental Screening: Recommended annual dental exams for proper oral hygiene  Diabetic Foot Exam: N/A  Community Resource Referral / Chronic Care Management: CRR required this visit?  No   CCM required this visit?  No     Plan:     I have personally reviewed and noted the following in the patient's chart:   Medical and social history Use of alcohol, tobacco or illicit drugs  Current medications and supplements including opioid prescriptions. Patient is not currently taking opioid prescriptions. Functional ability and status Nutritional status Physical activity Advanced directives List of other physicians Hospitalizations, surgeries, and ER visits in previous 12 months Vitals Screenings to include cognitive, depression, and falls Referrals and appointments  In addition, I have reviewed and discussed with patient certain preventive protocols, quality metrics, and best practice recommendations. A written personalized care plan for preventive services as well as general preventive health recommendations were provided to  patient.     Mickeal Needy, LPN   [id number]   After Visit Summary: (In Person-Printed) AVS printed and given to the patient  Nurse Notes: none

## 2023-02-08 NOTE — Patient Instructions (Addendum)
Mr. Drayton , Thank you for taking time to come for your Medicare Wellness Visit. I appreciate your ongoing commitment to your health goals. Please review the following plan we discussed and let me know if I can assist you in the future.   Referrals/Orders/Follow-Ups/Clinician Recommendations: No  This is a list of the screening recommended for you and due dates:  Health Maintenance  Topic Date Due   Zoster (Shingles) Vaccine (1 of 2) 01/26/1986   Flu Shot  11/10/2022   COVID-19 Vaccine (3 - 2023-24 season) 12/11/2022   Medicare Annual Wellness Visit  02/08/2024   DTaP/Tdap/Td vaccine (3 - Td or Tdap) 12/02/2029   Pneumonia Vaccine  Completed   HPV Vaccine  Aged Out    Advanced directives: (Copy Requested) Please bring a copy of your health care power of attorney and living will to the office to be added to your chart at your convenience.  Next Medicare Annual Wellness Visit scheduled for next year: No

## 2023-02-09 ENCOUNTER — Encounter: Payer: Self-pay | Admitting: Internal Medicine

## 2023-02-09 ENCOUNTER — Ambulatory Visit: Payer: Medicare PPO | Admitting: Internal Medicine

## 2023-02-09 VITALS — BP 160/60 | HR 57 | Temp 97.7°F | Resp 16 | Ht 66.0 in | Wt 134.0 lb

## 2023-02-09 DIAGNOSIS — E785 Hyperlipidemia, unspecified: Secondary | ICD-10-CM | POA: Diagnosis not present

## 2023-02-09 DIAGNOSIS — F04 Amnestic disorder due to known physiological condition: Secondary | ICD-10-CM

## 2023-02-09 DIAGNOSIS — I739 Peripheral vascular disease, unspecified: Secondary | ICD-10-CM

## 2023-02-09 DIAGNOSIS — I10 Essential (primary) hypertension: Secondary | ICD-10-CM

## 2023-02-09 DIAGNOSIS — N401 Enlarged prostate with lower urinary tract symptoms: Secondary | ICD-10-CM

## 2023-02-09 DIAGNOSIS — M1000 Idiopathic gout, unspecified site: Secondary | ICD-10-CM

## 2023-02-09 DIAGNOSIS — N138 Other obstructive and reflux uropathy: Secondary | ICD-10-CM

## 2023-02-09 DIAGNOSIS — N1831 Chronic kidney disease, stage 3a: Secondary | ICD-10-CM | POA: Diagnosis not present

## 2023-02-09 DIAGNOSIS — E538 Deficiency of other specified B group vitamins: Secondary | ICD-10-CM

## 2023-02-09 LAB — URINALYSIS, ROUTINE W REFLEX MICROSCOPIC
Bilirubin Urine: NEGATIVE
Ketones, ur: NEGATIVE
Leukocytes,Ua: NEGATIVE
Nitrite: NEGATIVE
Specific Gravity, Urine: >=1.03 — AB (ref 1.000–1.030)
Total Protein, Urine: 100 — AB
Urine Glucose: NEGATIVE
Urobilinogen, UA: 0.2 (ref 0.0–1.0)
pH: 6 (ref 5.0–8.0)

## 2023-02-09 LAB — BASIC METABOLIC PANEL
BUN: 29 mg/dL — ABNORMAL HIGH (ref 6–23)
CO2: 27 meq/L (ref 19–32)
Calcium: 9.4 mg/dL (ref 8.4–10.5)
Chloride: 105 meq/L (ref 96–112)
Creatinine, Ser: 1.64 mg/dL — ABNORMAL HIGH (ref 0.40–1.50)
GFR: 37.5 mL/min — ABNORMAL LOW (ref >60.00)
Glucose, Bld: 89 mg/dL (ref 70–99)
Potassium: 4.4 meq/L (ref 3.5–5.1)
Sodium: 141 meq/L (ref 135–145)

## 2023-02-09 LAB — HEPATIC FUNCTION PANEL
ALT: 26 U/L (ref 0–53)
AST: 28 U/L (ref 0–37)
Albumin: 4.1 g/dL (ref 3.5–5.2)
Alkaline Phosphatase: 121 U/L — ABNORMAL HIGH (ref 39–117)
Bilirubin, Direct: 0.1 mg/dL (ref 0.0–0.3)
Total Bilirubin: 0.5 mg/dL (ref 0.2–1.2)
Total Protein: 7.2 g/dL (ref 6.0–8.3)

## 2023-02-09 LAB — CBC WITH DIFFERENTIAL/PLATELET
Basophils Absolute: 0.1 10*3/uL (ref 0.0–0.1)
Basophils Relative: 0.8 % (ref 0.0–3.0)
Eosinophils Absolute: 0.3 10*3/uL (ref 0.0–0.7)
Eosinophils Relative: 2.9 % (ref 0.0–5.0)
HCT: 41.4 % (ref 39.0–52.0)
Hemoglobin: 13.6 g/dL (ref 13.0–17.0)
Lymphocytes Relative: 16.6 % (ref 12.0–46.0)
Lymphs Abs: 1.6 10*3/uL (ref 0.7–4.0)
MCHC: 32.8 g/dL (ref 30.0–36.0)
MCV: 89.3 fL (ref 78.0–100.0)
Monocytes Absolute: 0.8 10*3/uL (ref 0.1–1.0)
Monocytes Relative: 8.1 % (ref 3.0–12.0)
Neutro Abs: 6.8 10*3/uL (ref 1.4–7.7)
Neutrophils Relative %: 71.6 % (ref 43.0–77.0)
Platelets: 255 10*3/uL (ref 150.0–400.0)
RBC: 4.63 Mil/uL (ref 4.22–5.81)
RDW: 15.2 % (ref 11.5–15.5)
WBC: 9.5 10*3/uL (ref 4.0–10.5)

## 2023-02-09 LAB — URIC ACID: Uric Acid, Serum: 6.3 mg/dL (ref 4.0–7.8)

## 2023-02-09 MED ORDER — VITAMIN B-1 50 MG PO TABS: 50.0000 mg | ORAL_TABLET | Freq: Every day | ORAL | 0 refills | Status: DC

## 2023-02-09 MED ORDER — AMLODIPINE BESYLATE 5 MG PO TABS: 5.0000 mg | ORAL_TABLET | Freq: Every day | ORAL | 0 refills | Status: DC

## 2023-02-09 MED ORDER — ASPIRIN 81 MG PO TBEC: 81.0000 mg | DELAYED_RELEASE_TABLET | Freq: Every day | ORAL | 0 refills | Status: DC

## 2023-02-09 MED ORDER — EZETIMIBE 10 MG PO TABS: 10.0000 mg | ORAL_TABLET | Freq: Every day | ORAL | 0 refills | Status: DC

## 2023-02-09 MED ORDER — SILODOSIN 4 MG PO CAPS: 4.0000 mg | ORAL_CAPSULE | Freq: Every day | ORAL | 0 refills | Status: DC

## 2023-02-09 MED ORDER — COLCHICINE 0.6 MG PO CAPS: 1.0000 | ORAL_CAPSULE | Freq: Every day | ORAL | 0 refills | Status: DC

## 2023-02-09 MED ORDER — XARELTO 2.5 MG PO TABS: 2.5000 mg | ORAL_TABLET | Freq: Two times a day (BID) | ORAL | 0 refills | Status: DC

## 2023-02-16 ENCOUNTER — Other Ambulatory Visit: Payer: Self-pay | Admitting: Internal Medicine

## 2023-02-16 ENCOUNTER — Telehealth: Payer: Self-pay | Admitting: Internal Medicine

## 2023-02-16 DIAGNOSIS — N1831 Chronic kidney disease, stage 3a: Secondary | ICD-10-CM

## 2023-02-16 NOTE — Telephone Encounter (Signed)
I see his labs are back but I don't see where you've had the chance to comment on them. Please advise.

## 2023-02-16 NOTE — Telephone Encounter (Signed)
Patient is responding to message on his labs - he would like for Dr. Yetta Barre to set him up with a kidney specialist.

## 2023-03-06 ENCOUNTER — Telehealth: Payer: Self-pay | Admitting: Internal Medicine

## 2023-03-06 NOTE — Telephone Encounter (Signed)
Pt daughter called wanting to know the renal GSR levels from her father results wanting to know what stage he is in Please call back   Amy [phone]

## 2023-03-06 NOTE — Telephone Encounter (Signed)
Please advise I see the lab work is back I don't see your comments.

## 2023-03-06 NOTE — Telephone Encounter (Signed)
Patients daughter want to know if this would be closer to stage a or stage b. Looks like you said he was stage 4 and she is very concerned about it.

## 2023-03-08 NOTE — Telephone Encounter (Signed)
Advised patient that he is stage 3b and for him to let his daughter know. He gave a verbal understanding and states that he will let his daughter know.

## 2023-03-18 ENCOUNTER — Encounter (HOSPITAL_COMMUNITY): Payer: Self-pay | Admitting: *Deleted

## 2023-03-18 ENCOUNTER — Emergency Department (HOSPITAL_COMMUNITY): Payer: Medicare PPO

## 2023-03-18 ENCOUNTER — Emergency Department (HOSPITAL_COMMUNITY)
Admission: EM | Admit: 2023-03-18 | Discharge: 2023-03-18 | Disposition: A | Discharge status: Home / Self Care | Payer: Medicare PPO | Attending: Emergency Medicine | Admitting: Emergency Medicine

## 2023-03-18 ENCOUNTER — Other Ambulatory Visit: Payer: Self-pay

## 2023-03-18 DIAGNOSIS — S0083XA Contusion of other part of head, initial encounter: Secondary | ICD-10-CM | POA: Insufficient documentation

## 2023-03-18 DIAGNOSIS — T148XXA Other injury of unspecified body region, initial encounter: Secondary | ICD-10-CM

## 2023-03-18 DIAGNOSIS — Z79899 Other long term (current) drug therapy: Secondary | ICD-10-CM | POA: Insufficient documentation

## 2023-03-18 DIAGNOSIS — S0031XA Abrasion of nose, initial encounter: Secondary | ICD-10-CM | POA: Diagnosis not present

## 2023-03-18 DIAGNOSIS — W19XXXA Unspecified fall, initial encounter: Secondary | ICD-10-CM | POA: Diagnosis not present

## 2023-03-18 DIAGNOSIS — I4891 Unspecified atrial fibrillation: Secondary | ICD-10-CM | POA: Insufficient documentation

## 2023-03-18 DIAGNOSIS — W1812XA Fall from or off toilet with subsequent striking against object, initial encounter: Secondary | ICD-10-CM | POA: Insufficient documentation

## 2023-03-18 DIAGNOSIS — Y92002 Bathroom of unspecified non-institutional (private) residence single-family (private) house as the place of occurrence of the external cause: Secondary | ICD-10-CM | POA: Diagnosis not present

## 2023-03-18 DIAGNOSIS — R58 Hemorrhage, not elsewhere classified: Secondary | ICD-10-CM | POA: Diagnosis not present

## 2023-03-18 DIAGNOSIS — Z85038 Personal history of other malignant neoplasm of large intestine: Secondary | ICD-10-CM | POA: Diagnosis not present

## 2023-03-18 DIAGNOSIS — Z7982 Long term (current) use of aspirin: Secondary | ICD-10-CM | POA: Diagnosis not present

## 2023-03-18 DIAGNOSIS — N189 Chronic kidney disease, unspecified: Secondary | ICD-10-CM | POA: Diagnosis not present

## 2023-03-18 DIAGNOSIS — Z85828 Personal history of other malignant neoplasm of skin: Secondary | ICD-10-CM | POA: Diagnosis not present

## 2023-03-18 DIAGNOSIS — I10 Essential (primary) hypertension: Secondary | ICD-10-CM | POA: Insufficient documentation

## 2023-03-18 DIAGNOSIS — Z7901 Long term (current) use of anticoagulants: Secondary | ICD-10-CM | POA: Diagnosis not present

## 2023-03-18 DIAGNOSIS — S0990XA Unspecified injury of head, initial encounter: Secondary | ICD-10-CM | POA: Diagnosis present

## 2023-03-18 DIAGNOSIS — Z043 Encounter for examination and observation following other accident: Secondary | ICD-10-CM | POA: Diagnosis not present

## 2023-03-18 DIAGNOSIS — I700 Atherosclerosis of aorta: Secondary | ICD-10-CM | POA: Diagnosis not present

## 2023-03-18 DIAGNOSIS — I129 Hypertensive chronic kidney disease with stage 1 through stage 4 chronic kidney disease, or unspecified chronic kidney disease: Secondary | ICD-10-CM | POA: Diagnosis not present

## 2023-03-18 DIAGNOSIS — R001 Bradycardia, unspecified: Secondary | ICD-10-CM | POA: Diagnosis not present

## 2023-03-18 DIAGNOSIS — I6782 Cerebral ischemia: Secondary | ICD-10-CM | POA: Diagnosis not present

## 2023-03-18 DIAGNOSIS — N183 Chronic kidney disease, stage 3 unspecified: Secondary | ICD-10-CM

## 2023-03-18 LAB — BASIC METABOLIC PANEL
Anion gap: 10 (ref 5–15)
BUN: 36 mg/dL — ABNORMAL HIGH (ref 8–23)
CO2: 23 mmol/L (ref 22–32)
Calcium: 9 mg/dL (ref 8.9–10.3)
Chloride: 106 mmol/L (ref 98–111)
Creatinine, Ser: 1.95 mg/dL — ABNORMAL HIGH (ref 0.61–1.24)
GFR, Estimated: 33 mL/min — ABNORMAL LOW (ref >60)
Glucose, Bld: 125 mg/dL — ABNORMAL HIGH (ref 70–99)
Potassium: 4.1 mmol/L (ref 3.5–5.1)
Sodium: 139 mmol/L (ref 135–145)

## 2023-03-18 LAB — CBC WITH DIFFERENTIAL/PLATELET
Abs Immature Granulocytes: 0.02 10*3/uL (ref 0.00–0.07)
Basophils Absolute: 0.1 10*3/uL (ref 0.0–0.1)
Basophils Relative: 1 %
Eosinophils Absolute: 0.4 10*3/uL (ref 0.0–0.5)
Eosinophils Relative: 4 %
HCT: 38.2 % — ABNORMAL LOW (ref 39.0–52.0)
Hemoglobin: 12.8 g/dL — ABNORMAL LOW (ref 13.0–17.0)
Immature Granulocytes: 0 %
Lymphocytes Relative: 28 %
Lymphs Abs: 2.7 10*3/uL (ref 0.7–4.0)
MCH: 29.4 pg (ref 26.0–34.0)
MCHC: 33.5 g/dL (ref 30.0–36.0)
MCV: 87.8 fL (ref 80.0–100.0)
Monocytes Absolute: 1 10*3/uL (ref 0.1–1.0)
Monocytes Relative: 10 %
Neutro Abs: 5.4 10*3/uL (ref 1.7–7.7)
Neutrophils Relative %: 57 %
Platelets: 205 10*3/uL (ref 150–400)
RBC: 4.35 MIL/uL (ref 4.22–5.81)
RDW: 14.1 % (ref 11.5–15.5)
WBC: 9.6 10*3/uL (ref 4.0–10.5)
nRBC: 0 % (ref 0.0–0.2)

## 2023-03-18 LAB — I-STAT CHEM 8, ED
BUN: 35 mg/dL — ABNORMAL HIGH (ref 8–23)
Calcium, Ion: 1.11 mmol/L — ABNORMAL LOW (ref 1.15–1.40)
Chloride: 109 mmol/L (ref 98–111)
Creatinine, Ser: 2.1 mg/dL — ABNORMAL HIGH (ref 0.61–1.24)
Glucose, Bld: 119 mg/dL — ABNORMAL HIGH (ref 70–99)
HCT: 38 % — ABNORMAL LOW (ref 39.0–52.0)
Hemoglobin: 12.9 g/dL — ABNORMAL LOW (ref 13.0–17.0)
Potassium: 4 mmol/L (ref 3.5–5.1)
Sodium: 142 mmol/L (ref 135–145)
TCO2: 24 mmol/L (ref 22–32)

## 2023-03-18 LAB — I-STAT CG4 LACTIC ACID, ED: Lactic Acid, Venous: 1.1 mmol/L (ref 0.5–1.9)

## 2023-03-18 NOTE — ED Triage Notes (Signed)
Pt arrived for fall from toilet. Pt is on Xarelto. Reports constipation and was straining on the toilet; had near syncopal episode while straining; abrasion noted to nose. GCS 15; ENS VS 220/90, pulse 54

## 2023-03-18 NOTE — Progress Notes (Signed)
Trauma Response Nurse Documentation   Christopher Rubio. is a 89 y.o. male arriving to [facility] ED via [facility]  On Xarelto (rivaroxaban) daily. Trauma was activated as a Level 2 by Lester Kinsman RN based on the following trauma criteria Elderly patients > 65 with head trauma on anti-coagulation (excluding ASA). Trauma team at the bedside on patient arrival.   Patient cleared for CT by Dr. Nicanor Alcon. Pt transported to CT with trauma response nurse present to monitor. RN remained with the patient throughout their absence from the department for clinical observation.   GCS 15.  History   Past Medical History:  Diagnosis Date   Anemia    Atrial fibrillation (HCC)    Basal cell carcinoma of skin, site unspecified    Benign neoplasm of colon    Contact dermatitis and other eczema, due to unspecified cause    Diverticulosis of colon (without mention of hemorrhage)    Extrinsic asthma, unspecified    Gout, unspecified    Impotence of organic origin    Other and unspecified hyperlipidemia    Shortness of breath 08/19/2019   Unspecified essential hypertension      Past Surgical History:  Procedure Laterality Date   TONSILLECTOMY         Initial Focused Assessment (If applicable, or please see trauma documentation): Airway-- intact, no visible obstruction Breathing-- spontaneous, unlabored Circulation-- abrasion to bridge of the nose, bleeding controlled  CT's Completed:   CT Head and CT C-Spine   Interventions:  See event summary  Plan for disposition:  Discharge home   Consults completed:  none at 0200.  Event Summary: Patient brought in by [facility]. Patient with syncopal episode this evening while attempting to use the restroom. Patient fell forward and struck bridge of nose. Patient hypertensive in the 200 with EMS. On arrival patient transferred from EMS stretcher to hospital stretcher. Patient A&Ox4, GCS 15. Manual BP obtained. Lab work  obtained. Abrasion to bridge of nose, bleeding controlled. Xray chest completed. Patient to CT with TRN. CT head and c-spine completed. Patient back to exam room at this time.   MTP Summary (If applicable):  N/A  Bedside handoff with ED RN [place].    Leota Sauers  Trauma Response RN  Please call TRN at [phone] for further assistance.

## 2023-03-18 NOTE — ED Notes (Signed)
Pt to CT with TRN  

## 2023-03-18 NOTE — Discharge Instructions (Addendum)
Ice for 20 minutes every 2 hours.

## 2023-03-18 NOTE — ED Provider Notes (Addendum)
Christopher Rubio AT [facility] Provider Note   CSN: [id number] Arrival date & time: 03/18/23  0048     History  Chief Complaint  Patient presents with   Christopher Rubio. is a 89 y.o. male.  The history is provided by the patient and the EMS personnel.  Fall This is a new problem. The current episode started less than 1 hour ago. The problem occurs rarely. The problem has been resolved. Pertinent negatives include no chest pain, no abdominal pain, no headaches and no shortness of breath. Nothing aggravates the symptoms. Nothing relieves the symptoms. He has tried nothing for the symptoms. The treatment provided significant relief.  Patient with AFIB on Xarelto BIB EMS as a fall on thinners after straining to have a BM.      Past Medical History:  Diagnosis Date   Anemia    Atrial fibrillation (HCC)    Basal cell carcinoma of skin, site unspecified    Benign neoplasm of colon    Contact dermatitis and other eczema, due to unspecified cause    Diverticulosis of colon (without mention of hemorrhage)    Extrinsic asthma, unspecified    Gout, unspecified    Impotence of organic origin    Other and unspecified hyperlipidemia    Shortness of breath 08/19/2019   Unspecified essential hypertension      Home Medications Prior to Admission medications   Medication Sig Start Date End Date Taking? Authorizing Provider  amLODipine (NORVASC) 5 MG tablet Take 1 tablet (5 mg total) by mouth daily. TAKE 1 TABLET(5 MG) BY MOUTH DAILY 02/09/23   Etta Grandchild, MD  aspirin EC 81 MG tablet Take 1 tablet (81 mg total) by mouth daily. Swallow whole. 02/09/23   Etta Grandchild, MD  Cholecalciferol (VITAMIN D) 2000 UNITS CAPS Take 2,000 Units by mouth daily.    [provider]  Colchicine (MITIGARE) 0.6 MG CAPS Take 1 capsule (0.6 mg total) by mouth daily. 02/09/23   Etta Grandchild, MD  ezetimibe (ZETIA) 10 MG tablet Take 1 tablet (10 mg total)  by mouth daily. 02/09/23   Etta Grandchild, MD  rivaroxaban (XARELTO) 2.5 MG TABS tablet Take 1 tablet (2.5 mg total) by mouth 2 (two) times daily. TAKE 1 TABLET(2.5 MG) BY MOUTH TWICE DAILY 02/09/23   Etta Grandchild, MD  silodosin (RAPAFLO) 4 MG CAPS capsule Take 1 capsule (4 mg total) by mouth daily with breakfast. 02/09/23   Etta Grandchild, MD  tacrolimus (PROTOPIC) 0.1 % ointment Apply topically 2 (two) times daily. 04/13/21   Etta Grandchild, MD  thiamine (VITAMIN B-1) 50 MG tablet Take 1 tablet (50 mg total) by mouth daily. 02/09/23   Etta Grandchild, MD      Allergies    Keflex [cephalexin], Statins, Codeine, Neomycin-bacitracin zn-polymyx, Penicillins, and Prednisone    Review of Systems   Review of Systems  Constitutional:  Negative for fever.  HENT:  Negative for facial swelling.   Respiratory:  Negative for shortness of breath.   Cardiovascular:  Negative for chest pain.  Gastrointestinal:  Negative for abdominal pain.  Neurological:  Negative for headaches.  All other systems reviewed and are negative.   Physical Exam Updated Vital Signs BP (!) 180/60   Pulse (!) 56   Temp (!) 97.4 F (36.3 C) (Oral)   Resp 16   Ht 5\' 4"  (1.626 m)   Wt 61.2 kg   SpO2 100%  BMI 23.17 kg/m  Physical Exam Vitals and nursing note reviewed.  Constitutional:      General: He is not in acute distress.    Appearance: He is well-developed. He is not diaphoretic.  HENT:     Head: Normocephalic. No raccoon eyes or Battle's sign.     Jaw: No malocclusion.      Nose: No rhinorrhea.     Comments: No septal hematoma, non tender, bridge is stable  Eyes:     Conjunctiva/sclera: Conjunctivae normal.     Pupils: Pupils are equal, round, and reactive to light.     Comments: Pupils are equal and brisk reaction   Cardiovascular:     Rate and Rhythm: Normal rate and regular rhythm.  Pulmonary:     Effort: Pulmonary effort is normal.     Breath sounds: Normal breath sounds. No wheezing or  rales.  Abdominal:     General: Bowel sounds are normal.     Palpations: Abdomen is soft.     Tenderness: There is no abdominal tenderness. There is no guarding or rebound.     Hernia: No hernia is present.  Musculoskeletal:        General: Normal range of motion.     Right wrist: No bony tenderness or snuff box tenderness.     Left wrist: No bony tenderness or snuff box tenderness.     Right hand: Normal.     Left hand: Normal.     Cervical back: Normal range of motion and neck supple. No tenderness.     Right ankle: Normal.     Right Achilles Tendon: Normal.     Left ankle: Normal.     Left Achilles Tendon: Normal.     Right foot: Normal capillary refill. No deformity. Normal pulse.     Left foot: Normal capillary refill. No deformity. Normal pulse.  Skin:    General: Skin is warm and dry.     Capillary Refill: Capillary refill takes less than 2 seconds.  Neurological:     General: No focal deficit present.     Mental Status: He is alert and oriented to person, place, and time.     Deep Tendon Reflexes: Reflexes normal.  Psychiatric:        Mood and Affect: Mood normal.     ED Results / Procedures / Treatments   Labs (all labs ordered are listed, but only abnormal results are displayed) Results for orders placed or performed during the hospital encounter of 03/18/23  CBC with Differential  Result Value Ref Range   WBC 9.6 4.0 - 10.5 K/uL   RBC 4.35 4.22 - 5.81 MIL/uL   Hemoglobin 12.8 (L) 13.0 - 17.0 g/dL   HCT 16.1 (L) 09.6 - 04.5 %   MCV 87.8 80.0 - 100.0 fL   MCH 29.4 26.0 - 34.0 pg   MCHC 33.5 30.0 - 36.0 g/dL   RDW 40.9 81.1 - 91.4 %   Platelets 205 150 - 400 K/uL   nRBC 0.0 0.0 - 0.2 %   Neutrophils Relative % 57 %   Neutro Abs 5.4 1.7 - 7.7 K/uL   Lymphocytes Relative 28 %   Lymphs Abs 2.7 0.7 - 4.0 K/uL   Monocytes Relative 10 %   Monocytes Absolute 1.0 0.1 - 1.0 K/uL   Eosinophils Relative 4 %   Eosinophils Absolute 0.4 0.0 - 0.5 K/uL   Basophils  Relative 1 %   Basophils Absolute 0.1 0.0 - 0.1 K/uL  Immature Granulocytes 0 %   Abs Immature Granulocytes 0.02 0.00 - 0.07 K/uL  Basic metabolic panel  Result Value Ref Range   Sodium 139 135 - 145 mmol/L   Potassium 4.1 3.5 - 5.1 mmol/L   Chloride 106 98 - 111 mmol/L   CO2 23 22 - 32 mmol/L   Glucose, Bld 125 (H) 70 - 99 mg/dL   BUN 36 (H) 8 - 23 mg/dL   Creatinine, Ser 1.61 (H) 0.61 - 1.24 mg/dL   Calcium 9.0 8.9 - 09.6 mg/dL   GFR, Estimated 33 (L) >60 mL/min   Anion gap 10 5 - 15  I-stat chem 8, ed  Result Value Ref Range   Sodium 142 135 - 145 mmol/L   Potassium 4.0 3.5 - 5.1 mmol/L   Chloride 109 98 - 111 mmol/L   BUN 35 (H) 8 - 23 mg/dL   Creatinine, Ser 0.45 (H) 0.61 - 1.24 mg/dL   Glucose, Bld 409 (H) 70 - 99 mg/dL   Calcium, Ion 8.11 (L) 1.15 - 1.40 mmol/L   TCO2 24 22 - 32 mmol/L   Hemoglobin 12.9 (L) 13.0 - 17.0 g/dL   HCT 91.4 (L) 78.2 - 95.6 %  I-Stat CG4 Lactic Acid, ED  Result Value Ref Range   Lactic Acid, Venous 1.1 0.5 - 1.9 mmol/L   CT Cervical Spine Wo Contrast  Result Date: 03/18/2023 CLINICAL DATA:  Fall.  Abrasions to nose. EXAM: CT CERVICAL SPINE WITHOUT CONTRAST TECHNIQUE: Multidetector CT imaging of the cervical spine was performed without intravenous contrast. Multiplanar CT image reconstructions were also generated. RADIATION DOSE REDUCTION: This exam was performed according to the departmental dose-optimization program which includes automated exposure control, adjustment of the mA and/or kV according to patient size and/or use of iterative reconstruction technique. COMPARISON:  None Available. FINDINGS: Alignment: Normal Skull base and vertebrae: No acute fracture. No primary bone lesion or focal pathologic process. Soft tissues and spinal canal: No prevertebral fluid or swelling. No visible canal hematoma. Disc levels:  Disc spaces maintained.  No disc herniation. Upper chest: No acute findings Other: None IMPRESSION: No acute bony abnormality.  Electronically Signed   By: Charlett Nose M.D.   On: 03/18/2023 01:21   CT Head Wo Contrast  Result Date: 03/18/2023 CLINICAL DATA:  Fall, on blood thinners.  Abrasions to nose. EXAM: CT HEAD WITHOUT CONTRAST TECHNIQUE: Contiguous axial images were obtained from the base of the skull through the vertex without intravenous contrast. RADIATION DOSE REDUCTION: This exam was performed according to the departmental dose-optimization program which includes automated exposure control, adjustment of the mA and/or kV according to patient size and/or use of iterative reconstruction technique. COMPARISON:  04/08/2013 FINDINGS: Brain: There is atrophy and chronic small vessel disease changes. No acute intracranial abnormality. Specifically, no hemorrhage, hydrocephalus, mass lesion, acute infarction, or significant intracranial injury. Vascular: No hyperdense vessel or unexpected calcification. Skull: No acute calvarial abnormality. Sinuses/Orbits: No acute findings Other: None IMPRESSION: Atrophy, chronic microvascular disease. No acute intracranial abnormality. Electronically Signed   By: Charlett Nose M.D.   On: 03/18/2023 01:20   DG Chest Portable 1 View  Result Date: 03/18/2023 CLINICAL DATA:  Recent fall, initial encounter EXAM: PORTABLE CHEST 1 VIEW COMPARISON:  04/07/2013 FINDINGS: Cardiac shadow is prominent but stable. Aortic calcifications are seen. Lungs are well aerated bilaterally. No pneumothorax is seen. No acute rib abnormality is noted. IMPRESSION: No acute abnormality noted. Electronically Signed   By: Alcide Clever M.D.   On: 03/18/2023 01:07  EKG None  Radiology CT Cervical Spine Wo Contrast  Result Date: 03/18/2023 CLINICAL DATA:  Fall.  Abrasions to nose. EXAM: CT CERVICAL SPINE WITHOUT CONTRAST TECHNIQUE: Multidetector CT imaging of the cervical spine was performed without intravenous contrast. Multiplanar CT image reconstructions were also generated. RADIATION DOSE REDUCTION: This exam  was performed according to the departmental dose-optimization program which includes automated exposure control, adjustment of the mA and/or kV according to patient size and/or use of iterative reconstruction technique. COMPARISON:  None Available. FINDINGS: Alignment: Normal Skull base and vertebrae: No acute fracture. No primary bone lesion or focal pathologic process. Soft tissues and spinal canal: No prevertebral fluid or swelling. No visible canal hematoma. Disc levels:  Disc spaces maintained.  No disc herniation. Upper chest: No acute findings Other: None IMPRESSION: No acute bony abnormality. Electronically Signed   By: Charlett Nose M.D.   On: 03/18/2023 01:21   CT Head Wo Contrast  Result Date: 03/18/2023 CLINICAL DATA:  Fall, on blood thinners.  Abrasions to nose. EXAM: CT HEAD WITHOUT CONTRAST TECHNIQUE: Contiguous axial images were obtained from the base of the skull through the vertex without intravenous contrast. RADIATION DOSE REDUCTION: This exam was performed according to the departmental dose-optimization program which includes automated exposure control, adjustment of the mA and/or kV according to patient size and/or use of iterative reconstruction technique. COMPARISON:  04/08/2013 FINDINGS: Brain: There is atrophy and chronic small vessel disease changes. No acute intracranial abnormality. Specifically, no hemorrhage, hydrocephalus, mass lesion, acute infarction, or significant intracranial injury. Vascular: No hyperdense vessel or unexpected calcification. Skull: No acute calvarial abnormality. Sinuses/Orbits: No acute findings Other: None IMPRESSION: Atrophy, chronic microvascular disease. No acute intracranial abnormality. Electronically Signed   By: Charlett Nose M.D.   On: 03/18/2023 01:20   DG Chest Portable 1 View  Result Date: 03/18/2023 CLINICAL DATA:  Recent fall, initial encounter EXAM: PORTABLE CHEST 1 VIEW COMPARISON:  04/07/2013 FINDINGS: Cardiac shadow is prominent but  stable. Aortic calcifications are seen. Lungs are well aerated bilaterally. No pneumothorax is seen. No acute rib abnormality is noted. IMPRESSION: No acute abnormality noted. Electronically Signed   By: Alcide Clever M.D.   On: 03/18/2023 01:07    Procedures Procedures    Medications Ordered in ED Medications - No data to display  ED Course/ Medical Decision Making/ A&P                                 Medical Decision Making Larey Seat forward off the commode striking nose. Tetanus is up to date   Amount and/or Complexity of Data Reviewed Independent Historian: EMS    Details: See above  External Data Reviewed: labs and notes.    Details: Previous notes reviewed  Labs: ordered.    Details: Normal white count 9.6, hemoglobin slight low 12.8, normal platelets.  Normal sodium 139, normal potassium 4.1, elevated creatinine 1.95 (has known stage 3 kidney disease) Radiology: ordered and independent interpretation performed.    Details: Negative CT head  ECG/medicine tests: ordered and independent interpretation performed.  Risk Risk Details: Wound care provided to the abrasion on the bridge of the nose.  Pupils continued to be equal in the ED.  Patient is AO 4 and has no complaints.  Very well appearing.  Stable for discharge with close follow up.  Wound care and ice instructions provided along with refillable ice bag.  All questions answered to patient and family satisfaction  Final Clinical Impression(s) / ED Diagnoses Return for intractable cough, coughing up blood, fevers > 100.4 unrelieved by medication, shortness of breath, intractable vomiting, chest pain, shortness of breath, weakness, numbness, changes in speech, facial asymmetry, abdominal pain, passing out, Inability to tolerate liquids or food, cough, altered mental status or any concerns. No signs of systemic illness or infection. The patient is nontoxic-appearing on exam and vital signs are within normal limits.  I have  reviewed the triage vital signs and the nursing notes. Pertinent labs & imaging results that were available during my care of the patient were reviewed by me and considered in my medical decision making (see chart for details). After history, exam, and medical workup I feel the patient has been appropriately medically screened and is safe for discharge home. Pertinent diagnoses were discussed with the patient. Patient was given return precautions.         Rily Nickey, MD 03/18/23 [phone]

## 2023-03-21 ENCOUNTER — Encounter: Payer: Self-pay | Admitting: Internal Medicine

## 2023-03-21 ENCOUNTER — Telehealth: Payer: Self-pay

## 2023-03-21 ENCOUNTER — Ambulatory Visit: Payer: Medicare PPO | Admitting: Internal Medicine

## 2023-03-21 ENCOUNTER — Telehealth: Payer: Self-pay | Admitting: Internal Medicine

## 2023-03-21 VITALS — BP 166/64 | HR 48 | Temp 98.1°F | Ht 64.0 in | Wt 134.0 lb

## 2023-03-21 DIAGNOSIS — F17200 Nicotine dependence, unspecified, uncomplicated: Secondary | ICD-10-CM

## 2023-03-21 DIAGNOSIS — N1832 Chronic kidney disease, stage 3b: Secondary | ICD-10-CM

## 2023-03-21 MED ORDER — TADALAFIL 5 MG PO TABS: 5.0000 mg | ORAL_TABLET | Freq: Every day | ORAL | 0 refills | Status: DC | PRN

## 2023-03-21 MED ORDER — COLCHICINE 0.6 MG PO TABS: 0.6000 mg | ORAL_TABLET | Freq: Every day | ORAL | 0 refills | Status: DC | PRN

## 2023-03-21 NOTE — Telephone Encounter (Signed)
Pt is requesting to switch PCP providers from Dr. Yetta Barre to Dr. Okey Dupre. Please let me know if this is acceptable.

## 2023-03-21 NOTE — Telephone Encounter (Signed)
Fine with me for TOC to keep care with PCP until visit

## 2023-03-21 NOTE — Telephone Encounter (Signed)
There is a kidney group in [place] ok to let referrals know to send his prior nephrology referral to there

## 2023-03-21 NOTE — Progress Notes (Signed)
   Subjective:   Patient ID: Christopher Rubio., male    DOB: March 30, 1934, 89 y.o.   MRN: [id number]  HPI The patient is an 89 YO man coming in for concerns about health overall and with questions.   Review of Systems  Constitutional: Negative.   HENT: Negative.    Eyes: Negative.   Respiratory:  Negative for cough, chest tightness and shortness of breath.   Cardiovascular:  Negative for chest pain, palpitations and leg swelling.  Gastrointestinal:  Negative for abdominal distention, abdominal pain, constipation, diarrhea, nausea and vomiting.  Musculoskeletal:  Positive for arthralgias.  Skin: Negative.   Neurological: Negative.   Psychiatric/Behavioral: Negative.      Objective:  Physical Exam Constitutional:      Appearance: He is well-developed.  HENT:     Head: Normocephalic and atraumatic.  Cardiovascular:     Rate and Rhythm: Normal rate and regular rhythm.  Pulmonary:     Effort: Pulmonary effort is normal. No respiratory distress.     Breath sounds: Normal breath sounds. No wheezing or rales.  Abdominal:     General: Bowel sounds are normal. There is no distension.     Palpations: Abdomen is soft.     Tenderness: There is no abdominal tenderness. There is no rebound.  Musculoskeletal:     Cervical back: Normal range of motion.  Skin:    General: Skin is warm and dry.  Neurological:     Mental Status: He is alert and oriented to person, place, and time.     Coordination: Coordination normal.     Vitals:   03/21/23 1001 03/21/23 1012 03/21/23 1107  BP: (!) 172/80 (!) 172/80 (!) 166/64  Pulse: (!) 48    Temp: 98.1 F (36.7 C)    TempSrc: Oral    SpO2: 98%    Weight: 134 lb (60.8 kg)    Height: 5\' 4"  (1.626 m)      Assessment & Plan:  Visit time 30 minutes in face to face communication with patient and coordination of care, additional 5 minutes spent in record review, coordination or care, ordering tests, communicating/referring to other healthcare  professionals, documenting in medical records all on the same day of the visit for total time 35 minutes spent on the visit.

## 2023-03-24 ENCOUNTER — Encounter: Payer: Self-pay | Admitting: Internal Medicine

## 2023-03-24 NOTE — Assessment & Plan Note (Signed)
Had long discussion with patient and daughter that he has CKD stage 3b and this has been present and stable for some years. He is still smoking which we discussed was a risk for continued decline. He also has HTN and this is well controlled at home typically. No known DM.

## 2023-03-24 NOTE — Assessment & Plan Note (Signed)
Counseled to quit and he is unable to make attempt at this time.

## 2023-03-30 NOTE — Telephone Encounter (Signed)
Pt is scheduled for 2.28.25

## 2023-04-13 ENCOUNTER — Other Ambulatory Visit: Payer: Self-pay | Admitting: Internal Medicine

## 2023-04-13 DIAGNOSIS — E538 Deficiency of other specified B group vitamins: Secondary | ICD-10-CM

## 2023-04-13 DIAGNOSIS — E785 Hyperlipidemia, unspecified: Secondary | ICD-10-CM

## 2023-04-13 DIAGNOSIS — I739 Peripheral vascular disease, unspecified: Secondary | ICD-10-CM

## 2023-04-13 DIAGNOSIS — I10 Essential (primary) hypertension: Secondary | ICD-10-CM

## 2023-05-03 DIAGNOSIS — F17200 Nicotine dependence, unspecified, uncomplicated: Secondary | ICD-10-CM | POA: Diagnosis not present

## 2023-05-03 DIAGNOSIS — N1831 Chronic kidney disease, stage 3a: Secondary | ICD-10-CM | POA: Diagnosis not present

## 2023-05-03 DIAGNOSIS — N183 Chronic kidney disease, stage 3 unspecified: Secondary | ICD-10-CM | POA: Diagnosis not present

## 2023-05-03 DIAGNOSIS — D631 Anemia in chronic kidney disease: Secondary | ICD-10-CM | POA: Diagnosis not present

## 2023-05-03 DIAGNOSIS — R809 Proteinuria, unspecified: Secondary | ICD-10-CM | POA: Diagnosis not present

## 2023-05-03 DIAGNOSIS — I129 Hypertensive chronic kidney disease with stage 1 through stage 4 chronic kidney disease, or unspecified chronic kidney disease: Secondary | ICD-10-CM | POA: Diagnosis not present

## 2023-05-09 ENCOUNTER — Other Ambulatory Visit: Payer: Self-pay | Admitting: Nephrology

## 2023-05-09 DIAGNOSIS — N183 Chronic kidney disease, stage 3 unspecified: Secondary | ICD-10-CM

## 2023-05-11 ENCOUNTER — Ambulatory Visit
Admission: RE | Admit: 2023-05-11 | Discharge: 2023-05-11 | Disposition: A | Discharge status: Home / Self Care | Payer: Medicare PPO | Source: Ambulatory Visit | Attending: Nephrology | Admitting: Nephrology

## 2023-05-11 DIAGNOSIS — N183 Chronic kidney disease, stage 3 unspecified: Secondary | ICD-10-CM

## 2023-05-11 DIAGNOSIS — R93422 Abnormal radiologic findings on diagnostic imaging of left kidney: Secondary | ICD-10-CM | POA: Diagnosis not present

## 2023-05-11 DIAGNOSIS — N281 Cyst of kidney, acquired: Secondary | ICD-10-CM | POA: Diagnosis not present

## 2023-05-19 ENCOUNTER — Ambulatory Visit (INDEPENDENT_AMBULATORY_CARE_PROVIDER_SITE_OTHER): Payer: Medicare PPO

## 2023-05-19 ENCOUNTER — Ambulatory Visit (INDEPENDENT_AMBULATORY_CARE_PROVIDER_SITE_OTHER): Payer: Medicare PPO | Admitting: Nurse Practitioner

## 2023-05-19 VITALS — BP 132/74 | HR 57 | Temp 98.1°F | Ht 64.0 in | Wt 134.0 lb

## 2023-05-19 DIAGNOSIS — M25561 Pain in right knee: Secondary | ICD-10-CM | POA: Diagnosis not present

## 2023-05-19 DIAGNOSIS — N1832 Chronic kidney disease, stage 3b: Secondary | ICD-10-CM

## 2023-05-19 DIAGNOSIS — Z0389 Encounter for observation for other suspected diseases and conditions ruled out: Secondary | ICD-10-CM | POA: Diagnosis not present

## 2023-05-19 NOTE — Assessment & Plan Note (Addendum)
 Acute Etiology unclear, favor overuse versus gout.  No significant tenderness to touch thus I really think gout is not cause of symptoms at this time.  Will discontinue colchicine  based on kidney function. Overall improving Recommend treatment with Tylenol  as needed and will order x-ray for further evaluation.  Patient and daughter asked if topical Voltaren  gel can be used, we did discuss that it is an NSAID and can should be avoided in chronic kidney disease however infrequent short-term use for breakthrough pain between Tylenol  doses was recommended if pain is severe enough.  Further recommendations may be made based upon his results.

## 2023-05-19 NOTE — Progress Notes (Addendum)
 Established Patient Office Visit  Subjective   Patient ID: Christopher Gerding., male    DOB: [birth year]  Age: 89 y.o. MRN: [id number]  Chief Complaint  Patient presents with   Knee Pain    Right knee pain: Symptom onset 4 days ago after walking outside at a brisk pace.  He reports he walks daily, however this day he was walking faster because it looked like it was about to rain so he was trying to complete walk quickly.  He denies any falls or other trauma that he knows of to the area.  Has been using Tylenol  which has resulted in improved pain also reports that he had swelling which is still present but has improved as well.  He is able to bear weight.  Has history of gout, so he started taking colchicine  but then read is not recommended in kidney disease.  Based on last metabolic panel creatinine clearance is around 21.  CKD stage IIIb: Recently established with nephrology, has multiple questions regarding dietary changes that are necessary to make related to his kidney dysfunction.    ROS: see HPI    Objective:     BP 132/74   Pulse (!) 57   Temp 98.1 F (36.7 C) (Temporal)   Ht 5' 4 (1.626 m)   Wt 134 lb 0.2 oz (60.8 kg)   SpO2 97%   BMI 23.00 kg/m    Physical Exam Vitals reviewed.  Constitutional:      Appearance: Normal appearance.  HENT:     Head: Normocephalic and atraumatic.  Cardiovascular:     Rate and Rhythm: Normal rate and regular rhythm.  Pulmonary:     Effort: Pulmonary effort is normal.     Breath sounds: Normal breath sounds.  Musculoskeletal:     Cervical back: Neck supple.     Right knee: Swelling present. No erythema or bony tenderness. Normal range of motion. No tenderness.     Left knee: Normal.  Skin:    General: Skin is warm and dry.  Neurological:     Mental Status: He is alert and oriented to person, place, and time.  Psychiatric:        Mood and Affect: Mood normal.        Behavior: Behavior normal.        Thought Content:  Thought content normal.        Judgment: Judgment normal.      No results found for any visits on 05/19/23.    The ASCVD Risk score (Arnett DK, et al., 2019) failed to calculate for the following reasons:   The 2019 ASCVD risk score is only valid for ages 78 to 54    Assessment & Plan:   Problem List Items Addressed This Visit       Genitourinary   Kidney disease, chronic, stage III (GFR 30-59 ml/min) (HCC) - Primary   Chronic Continue to follow-up with nephrology as scheduled Offered referral to dietitian to discuss appropriate dietary changes related to his kidney disease.  Patient would like referral, thus this was ordered today.      Relevant Orders   Amb ref to Medical Nutrition Therapy-MNT     Other   Acute pain of right knee   Acute Etiology unclear, favor overuse versus gout.  No significant tenderness to touch thus I really think gout is not cause of symptoms at this time.  Will discontinue colchicine  based on kidney function. Overall improving Recommend treatment with Tylenol  as  needed and will order x-ray for further evaluation.  Patient and daughter asked if topical Voltaren  gel can be used, we did discuss that it is an NSAID and can should be avoided in chronic kidney disease however infrequent short-term use for breakthrough pain between Tylenol  doses was recommended if pain is severe enough.  Further recommendations may be made based upon his results.      Relevant Orders   DG Knee 1-2 Views Right (Completed)    Return if symptoms worsen or fail to improve.    Lauraine FORBES Pereyra, NP

## 2023-05-19 NOTE — Assessment & Plan Note (Signed)
 Chronic Continue to follow-up with nephrology as scheduled Offered referral to dietitian to discuss appropriate dietary changes related to his kidney disease.  Patient would like referral, thus this was ordered today.

## 2023-05-25 DIAGNOSIS — R809 Proteinuria, unspecified: Secondary | ICD-10-CM | POA: Diagnosis not present

## 2023-06-02 ENCOUNTER — Encounter: Payer: Self-pay | Admitting: Nurse Practitioner

## 2023-06-09 ENCOUNTER — Encounter: Payer: Self-pay | Admitting: Internal Medicine

## 2023-06-09 ENCOUNTER — Ambulatory Visit: Payer: Medicare PPO | Admitting: Internal Medicine

## 2023-06-09 VITALS — BP 114/68 | HR 84 | Temp 97.5°F | Ht 64.0 in | Wt 136.0 lb

## 2023-06-09 DIAGNOSIS — N1832 Chronic kidney disease, stage 3b: Secondary | ICD-10-CM

## 2023-06-09 DIAGNOSIS — F17200 Nicotine dependence, unspecified, uncomplicated: Secondary | ICD-10-CM | POA: Diagnosis not present

## 2023-06-09 DIAGNOSIS — Z0001 Encounter for general adult medical examination with abnormal findings: Secondary | ICD-10-CM | POA: Diagnosis not present

## 2023-06-09 DIAGNOSIS — I739 Peripheral vascular disease, unspecified: Secondary | ICD-10-CM | POA: Diagnosis not present

## 2023-06-09 NOTE — Assessment & Plan Note (Signed)
 Stable at this time and on antiplatelet.

## 2023-06-09 NOTE — Assessment & Plan Note (Signed)
Flu shot up to date. Pneumonia complete. Shingrix counseled. Tetanus up to date. Colonoscopy aged out. Counseled about sun safety and mole surveillance. Counseled about the dangers of distracted driving. Given 10 year screening recommendations.  

## 2023-06-09 NOTE — Patient Instructions (Signed)
 Dr. Doreen Beam is a good dermatologist.

## 2023-06-09 NOTE — Assessment & Plan Note (Signed)
 Declines to quit at this time.

## 2023-06-09 NOTE — Assessment & Plan Note (Signed)
 Counseled about this extensively.

## 2023-06-09 NOTE — Progress Notes (Signed)
   Subjective:   Patient ID: Christopher Rubio., male    DOB: 06-Aug-1933, 89 y.o.   MRN: [id number]  HPI The patient is an 89 YO man coming in for physical.  PMH, FMH, social history reviewed and updated  Review of Systems  Constitutional: Negative.   HENT: Negative.    Eyes: Negative.   Respiratory:  Negative for cough, chest tightness and shortness of breath.   Cardiovascular:  Negative for chest pain, palpitations and leg swelling.  Gastrointestinal:  Negative for abdominal distention, abdominal pain, constipation, diarrhea, nausea and vomiting.  Musculoskeletal: Negative.   Skin: Negative.   Neurological: Negative.   Psychiatric/Behavioral: Negative.      Objective:  Physical Exam Constitutional:      Appearance: He is well-developed.  HENT:     Head: Normocephalic and atraumatic.  Cardiovascular:     Rate and Rhythm: Normal rate and regular rhythm.  Pulmonary:     Effort: Pulmonary effort is normal. No respiratory distress.     Breath sounds: Normal breath sounds. No wheezing or rales.  Abdominal:     General: Bowel sounds are normal. There is no distension.     Palpations: Abdomen is soft.     Tenderness: There is no abdominal tenderness. There is no rebound.  Musculoskeletal:     Cervical back: Normal range of motion.  Skin:    General: Skin is warm and dry.  Neurological:     Mental Status: He is alert and oriented to person, place, and time.     Coordination: Coordination normal.     Vitals:   06/09/23 0951  BP: 114/68  Pulse: 84  Temp: (!) 97.5 F (36.4 C)  TempSrc: Oral  SpO2: 96%  Weight: 136 lb (61.7 kg)  Height: 5\' 4"  (1.626 m)    Assessment & Plan:

## 2023-06-19 ENCOUNTER — Other Ambulatory Visit: Payer: Self-pay | Admitting: Internal Medicine

## 2023-07-03 ENCOUNTER — Encounter: Payer: Self-pay | Admitting: Skilled Nursing Facility1

## 2023-07-03 ENCOUNTER — Encounter: Payer: Medicare PPO | Attending: Skilled Nursing Facility1 | Admitting: Skilled Nursing Facility1

## 2023-07-03 DIAGNOSIS — I129 Hypertensive chronic kidney disease with stage 1 through stage 4 chronic kidney disease, or unspecified chronic kidney disease: Secondary | ICD-10-CM | POA: Diagnosis not present

## 2023-07-03 DIAGNOSIS — N183 Chronic kidney disease, stage 3 unspecified: Secondary | ICD-10-CM | POA: Diagnosis not present

## 2023-07-03 NOTE — Progress Notes (Signed)
 Medical Nutrition Therapy  Appointment Start time:  8:56  Appointment End time:  9:45  Primary concerns today: a diet for his CKD  Referral diagnosis: CKD stage 3  Telephone visit: pt identified by name and DOB; pt agreeable to the limitations of this visit type  NUTRITION ASSESSMENT    Clinical Medical Hx: hyperlipidemia, HTN, cancer, asthma, anemia, Gout Medications: zetia; see list Labs: creatinine 1.95, GFR 33, BUN 36 Notable Signs/Symptoms: none reported   Lifestyle & Dietary Hx  Pt states he Lives by himself and cooks his own meals. Pt listed foods and wanted to know if he is still allowed to eat these things: dietitian answered pts questions to his satisfaction.  Pt keeps an expansive garden all throughout the year. Pt states he does get gout here and there (2 flares a year maybe) Pt states he only eats out 2 times a month.   Estimated daily fluid intake: unknown oz Supplements: vitamin D, vitamin B1 Sleep:  Stress / self-care: low levels of stress Current average weekly physical activity: Gardening   24-Hr Dietary Recall First Meal: nutrigrain bar (typically skipped) Snack:  Second Meal:  Snack:  Third Meal:  Snack:  Beverages: boost,    NUTRITION INTERVENTION  Nutrition education (E-1) on the following topics:  Discussed Gout and how to eat to reduce flares  CKD and the diet: plant based protein focus and reduction of animal meat portion sizes  Weight maintenance education to reduce further fat/muscle loss Creation of balanced and diverse meals to increase the intake of nutrient-rich foods that provide essential vitamins, minerals, fiber, and phytonutrients Variety of Fruits and Vegetables: Aim for a colorful array of fruits and vegetables to ensure a wide range of nutrients. Include a mix of leafy greens, berries, citrus fruits, cruciferous vegetables, and more. Whole Grains: Choose whole grains over refined grains. Examples include brown rice, quinoa,  oats, whole wheat, and barley. Lean Proteins: Include lean sources of protein, such as poultry, fish, tofu, legumes, beans, lentils, and low-fat dairy products. Limit red and processed meats. Healthy Fats: Incorporate sources of healthy fats, including avocados, nuts, seeds, and olive oil. Limit saturated and trans fats found in fried and processed foods. Dairy or Dairy Alternatives: Choose low-fat or fat-free dairy products, or plant-based alternatives like almond or soy milk. Portion Control: Be mindful of portion sizes to avoid overeating. Pay attention to hunger and satisfaction cues. Limit Added Sugars: Minimize the consumption of sugary beverages, snacks, and desserts. Check food labels for added sugars and opt for natural sources of sweetness such as whole fruits. Hydration: Drink plenty of water throughout the day. Limit sugary drinks and excessive caffeine intake. Moderate Sodium Intake: Reduce the consumption of high-sodium foods. Use herbs and spices for flavor instead of excessive salt. Meal Planning and Preparation: Plan and prepare meals ahead of time to make healthier choices more convenient. Include a mix of food groups in each meal. Limit Processed Foods: Minimize the intake of highly processed and packaged foods that are often high in added sugars, salt, and unhealthy fats. Regular Physical Activity: Combine a healthy diet with regular physical activity for overall well-being. Aim for at least 150 minutes of moderate-intensity aerobic exercise per week, along with strength training. Moderation and Balance: Enjoy treats and indulgent foods in moderation, emphasizing balance rather than strict restriction.   Learning Style & Readiness for Change Teaching method utilized: Visual & Auditory  Demonstrated degree of understanding via: Teach Back  Barriers to learning/adherence to lifestyle change: lack  of appetite/thirst   Goals Established by Pt I will eat every 3-5  hours I will aim for 80 ounces of fluid per day minimum    MONITORING & EVALUATION Dietary intake, weekly physical activity  Next Steps  Patient is to call with any future questions or concerns.

## 2023-07-06 ENCOUNTER — Other Ambulatory Visit: Payer: Self-pay | Admitting: Internal Medicine

## 2023-07-06 DIAGNOSIS — I739 Peripheral vascular disease, unspecified: Secondary | ICD-10-CM

## 2023-07-06 DIAGNOSIS — E785 Hyperlipidemia, unspecified: Secondary | ICD-10-CM

## 2023-07-06 DIAGNOSIS — I10 Essential (primary) hypertension: Secondary | ICD-10-CM

## 2023-08-02 DIAGNOSIS — N189 Chronic kidney disease, unspecified: Secondary | ICD-10-CM | POA: Diagnosis not present

## 2023-08-02 DIAGNOSIS — F17200 Nicotine dependence, unspecified, uncomplicated: Secondary | ICD-10-CM | POA: Diagnosis not present

## 2023-08-02 DIAGNOSIS — M109 Gout, unspecified: Secondary | ICD-10-CM | POA: Diagnosis not present

## 2023-08-02 DIAGNOSIS — N183 Chronic kidney disease, stage 3 unspecified: Secondary | ICD-10-CM | POA: Diagnosis not present

## 2023-08-02 DIAGNOSIS — R809 Proteinuria, unspecified: Secondary | ICD-10-CM | POA: Diagnosis not present

## 2023-08-02 DIAGNOSIS — D631 Anemia in chronic kidney disease: Secondary | ICD-10-CM | POA: Diagnosis not present

## 2023-08-02 DIAGNOSIS — N281 Cyst of kidney, acquired: Secondary | ICD-10-CM | POA: Diagnosis not present

## 2023-08-02 DIAGNOSIS — I129 Hypertensive chronic kidney disease with stage 1 through stage 4 chronic kidney disease, or unspecified chronic kidney disease: Secondary | ICD-10-CM | POA: Diagnosis not present

## 2023-08-04 ENCOUNTER — Other Ambulatory Visit: Payer: Self-pay | Admitting: Nephrology

## 2023-08-04 DIAGNOSIS — N281 Cyst of kidney, acquired: Secondary | ICD-10-CM

## 2023-08-07 ENCOUNTER — Other Ambulatory Visit: Payer: Self-pay | Admitting: Internal Medicine

## 2023-08-15 DIAGNOSIS — R220 Localized swelling, mass and lump, head: Secondary | ICD-10-CM | POA: Diagnosis not present

## 2023-08-16 ENCOUNTER — Ambulatory Visit: Payer: Self-pay

## 2023-08-16 NOTE — Telephone Encounter (Signed)
  Chief Complaint: facial swelling Symptoms: facial swelling started by the left eye 3 days ago, states today it is both sides of the face Frequency: today is 3rd day Pertinent Negatives: Patient denies SOB, difficulty breathing, scratchy throat, new hygiene products, hives,  Disposition: [x] ED /[] Urgent Care (no appt availability in office) / [] Appointment(In office/virtual)/ []  [facility]/ [] Home Care/ [] Refused Recommended Disposition /[] [facility]/ []  Follow-up with PCP Additional Notes: Pt states that he was at [facility] yesterday for face swelling, states they gave him a pill and sent him home.  Pt states that each day he wakes up and feels the swelling is worse than the day before. Pt denies any injury/bites. Pt states that he has an old pillow and no new laundry products. Pt states that he started losartan yesterday but was suffering from swelling prior to starting losartan, no other medications have been started recently. Pt states that he has taken benadryl. Pt denies difficulty breathing, SOB. Pt advised ED, pt unsure at this time if he will go.  Copied from CRM [id number]. Topic: Clinical - Red Word Triage >> Aug 16, 2023  4:46 PM Crispin Dolphin wrote: Red Word that prompted transfer to Nurse Triage: Swollen face Reason for Disposition  SEVERE swelling of the entire face  Answer Assessment - Initial Assessment Questions 1. ONSET: "When did the swelling start?" (e.g., minutes, hours, days)     2 days, went to UC yesterday 2. LOCATION: "What part of the face is swollen?"     Little spot by L eye, and second day down cheek, 3rd day-spread to other cheek. Pt states 3. SEVERITY: "How swollen is it?"     "This morning I was unrecognizable to myself, seems to be worse each morning". 4. ITCHING: "Is there any itching?" If Yes, ask: "How much?"   (Scale 1-10; mild, moderate or severe)     denies 5. PAIN: "Is the swelling painful to touch?" If Yes, ask: "How painful is it?"   (Scale  1-10; mild, moderate or severe)   - NONE (0): no pain   - MILD (1-3): doesn't interfere with normal activities    - MODERATE (4-7): interferes with normal activities or awakens from sleep    - SEVERE (8-10): excruciating pain, unable to do any normal activities      minimal 6. FEVER: "Do you have a fever?" If Yes, ask: "What is it, how was it measured, and when did it start?"      denies 7. CAUSE: "What do you think is causing the face swelling?"     Unsure, denies any hygiene changes, laundry soap changes, 9. OTHER SYMPTOMS: "Do you have any other symptoms?" (e.g., toothache, leg swelling)     denies  Protocols used: Face Swelling-A-AH

## 2023-08-17 DIAGNOSIS — N183 Chronic kidney disease, stage 3 unspecified: Secondary | ICD-10-CM | POA: Diagnosis not present

## 2023-08-17 DIAGNOSIS — D631 Anemia in chronic kidney disease: Secondary | ICD-10-CM | POA: Diagnosis not present

## 2023-08-17 DIAGNOSIS — M109 Gout, unspecified: Secondary | ICD-10-CM | POA: Diagnosis not present

## 2023-08-17 DIAGNOSIS — R809 Proteinuria, unspecified: Secondary | ICD-10-CM | POA: Diagnosis not present

## 2023-08-17 DIAGNOSIS — N281 Cyst of kidney, acquired: Secondary | ICD-10-CM | POA: Diagnosis not present

## 2023-08-17 DIAGNOSIS — I129 Hypertensive chronic kidney disease with stage 1 through stage 4 chronic kidney disease, or unspecified chronic kidney disease: Secondary | ICD-10-CM | POA: Diagnosis not present

## 2023-08-17 DIAGNOSIS — F17200 Nicotine dependence, unspecified, uncomplicated: Secondary | ICD-10-CM | POA: Diagnosis not present

## 2023-09-09 ENCOUNTER — Other Ambulatory Visit

## 2023-10-19 ENCOUNTER — Other Ambulatory Visit: Payer: Self-pay | Admitting: Internal Medicine

## 2023-10-19 DIAGNOSIS — I10 Essential (primary) hypertension: Secondary | ICD-10-CM

## 2023-11-01 ENCOUNTER — Other Ambulatory Visit: Payer: Self-pay | Admitting: Internal Medicine

## 2023-11-01 DIAGNOSIS — E785 Hyperlipidemia, unspecified: Secondary | ICD-10-CM

## 2023-11-01 DIAGNOSIS — I739 Peripheral vascular disease, unspecified: Secondary | ICD-10-CM

## 2023-11-01 DIAGNOSIS — N138 Other obstructive and reflux uropathy: Secondary | ICD-10-CM

## 2023-11-21 ENCOUNTER — Other Ambulatory Visit: Payer: Self-pay | Admitting: Internal Medicine

## 2023-11-21 DIAGNOSIS — I10 Essential (primary) hypertension: Secondary | ICD-10-CM

## 2023-11-23 ENCOUNTER — Emergency Department (HOSPITAL_COMMUNITY)

## 2023-11-23 ENCOUNTER — Emergency Department (HOSPITAL_COMMUNITY)
Admission: EM | Admit: 2023-11-23 | Discharge: 2023-11-23 | Disposition: A | Discharge status: Home / Self Care | Attending: Emergency Medicine | Admitting: Emergency Medicine

## 2023-11-23 ENCOUNTER — Other Ambulatory Visit: Payer: Self-pay

## 2023-11-23 ENCOUNTER — Encounter (HOSPITAL_COMMUNITY): Payer: Self-pay

## 2023-11-23 DIAGNOSIS — R55 Syncope and collapse: Secondary | ICD-10-CM | POA: Insufficient documentation

## 2023-11-23 DIAGNOSIS — E875 Hyperkalemia: Secondary | ICD-10-CM | POA: Diagnosis not present

## 2023-11-23 DIAGNOSIS — I959 Hypotension, unspecified: Secondary | ICD-10-CM | POA: Diagnosis not present

## 2023-11-23 DIAGNOSIS — S0003XA Contusion of scalp, initial encounter: Secondary | ICD-10-CM | POA: Diagnosis not present

## 2023-11-23 DIAGNOSIS — Z720 Tobacco use: Secondary | ICD-10-CM | POA: Diagnosis not present

## 2023-11-23 DIAGNOSIS — S0990XA Unspecified injury of head, initial encounter: Secondary | ICD-10-CM | POA: Diagnosis not present

## 2023-11-23 DIAGNOSIS — R42 Dizziness and giddiness: Secondary | ICD-10-CM | POA: Diagnosis not present

## 2023-11-23 DIAGNOSIS — R404 Transient alteration of awareness: Secondary | ICD-10-CM | POA: Diagnosis not present

## 2023-11-23 DIAGNOSIS — E860 Dehydration: Secondary | ICD-10-CM | POA: Diagnosis not present

## 2023-11-23 LAB — CBC
HCT: 35.8 % — ABNORMAL LOW (ref 39.0–52.0)
Hemoglobin: 11.7 g/dL — ABNORMAL LOW (ref 13.0–17.0)
MCH: 29 pg (ref 26.0–34.0)
MCHC: 32.7 g/dL (ref 30.0–36.0)
MCV: 88.6 fL (ref 80.0–100.0)
Platelets: 217 K/uL (ref 150–400)
RBC: 4.04 MIL/uL — ABNORMAL LOW (ref 4.22–5.81)
RDW: 14.4 % (ref 11.5–15.5)
WBC: 9.8 K/uL (ref 4.0–10.5)
nRBC: 0 % (ref 0.0–0.2)

## 2023-11-23 LAB — POTASSIUM: Potassium: 4.9 mmol/L (ref 3.5–5.1)

## 2023-11-23 LAB — COMPREHENSIVE METABOLIC PANEL WITH GFR
ALT: 13 U/L (ref 0–44)
AST: 24 U/L (ref 15–41)
Albumin: 3.1 g/dL — ABNORMAL LOW (ref 3.5–5.0)
Alkaline Phosphatase: 77 U/L (ref 38–126)
Anion gap: 11 (ref 5–15)
BUN: 34 mg/dL — ABNORMAL HIGH (ref 8–23)
CO2: 20 mmol/L — ABNORMAL LOW (ref 22–32)
Calcium: 8.2 mg/dL — ABNORMAL LOW (ref 8.9–10.3)
Chloride: 106 mmol/L (ref 98–111)
Creatinine, Ser: 1.67 mg/dL — ABNORMAL HIGH (ref 0.61–1.24)
GFR, Estimated: 39 mL/min — ABNORMAL LOW (ref >60)
Glucose, Bld: 113 mg/dL — ABNORMAL HIGH (ref 70–99)
Potassium: 5.4 mmol/L — ABNORMAL HIGH (ref 3.5–5.1)
Sodium: 137 mmol/L (ref 135–145)
Total Bilirubin: 1.2 mg/dL (ref 0.0–1.2)
Total Protein: 6 g/dL — ABNORMAL LOW (ref 6.5–8.1)

## 2023-11-23 LAB — PROTIME-INR
INR: 1.3 — ABNORMAL HIGH (ref 0.8–1.2)
Prothrombin Time: 16.5 s — ABNORMAL HIGH (ref 11.4–15.2)

## 2023-11-23 LAB — URINALYSIS, ROUTINE W REFLEX MICROSCOPIC
Bilirubin Urine: NEGATIVE
Glucose, UA: NEGATIVE mg/dL
Hgb urine dipstick: NEGATIVE
Ketones, ur: NEGATIVE mg/dL
Leukocytes,Ua: NEGATIVE
Nitrite: NEGATIVE
Protein, ur: 100 mg/dL — AB
Specific Gravity, Urine: 1.013 (ref 1.005–1.030)
pH: 6 (ref 5.0–8.0)

## 2023-11-23 LAB — CBG MONITORING, ED: Glucose-Capillary: 170 mg/dL — ABNORMAL HIGH (ref 70–99)

## 2023-11-23 MED ORDER — SODIUM ZIRCONIUM CYCLOSILICATE 10 G PO PACK
10.0000 g | PACK | Freq: Once | ORAL | Status: AC
  Administered 2023-11-23: 10 g via ORAL
  Filled 2023-11-23: qty 1

## 2023-11-23 NOTE — Discharge Instructions (Addendum)
 Please rest and keep well-hydrated.  Follow-up with your primary care doctor and nephrologist for repeat evaluation and labs.  Return to the emergency department if any worsening or concerning symptoms.  Hydrate well at home.  Follow-up with your doctors.

## 2023-11-23 NOTE — ED Notes (Signed)
 Pt ambulated to restroom with a little to no hand assistance. Pt stated he was not short of breath or dizzy.

## 2023-11-23 NOTE — ED Notes (Signed)
Pt okay to eat per MD

## 2023-11-23 NOTE — ED Notes (Signed)
 Spoke  with family member or daughter on phone who was very angry that the PT didn't have a order for fluids and stated that he was dehydrated. These concerns were given to the physician and he has been made aware.Family wants PT admitted and PT wants to be discharged.
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
## 2023-11-23 NOTE — ED Provider Notes (Addendum)
 Patient heart rate here has been in the 40s.  Will repeat EKG just to verify that it is a sinus bradycardia.  He is normally in the 50s.  Blood sugar here 170 repeat potassium was 4.9 first potassium was 5.4 probably hemolysis.  He did get Lokelma .  Renal function GFR 39.  White count 9.8 hemoglobin 11.7 platelets are good INR 1.3 urinalysis does appear to have many bacteria but no white cells or RBCs.  Will go ahead and send urine for culture.  CT head without any evidence of any acute abnormality.  Patient's kidney function here today is better than it was in December.  Send no acute kidney injury.  Urinalysis did have bacteria will send for culture does have protein in the urine but is followed by nephrology for that.  Patient ambulating well did repeat EKG.  Shows sinus bradycardia with a heart rate of 49.  Will see how patient does with ambulation but in theory he is stable for discharge home.   Katianna Mcclenney, MD 11/23/23 2310  Patient ambulated fine.  Patient really wants to go home.  Had a long conversation with his wife.  No real hard-core reason for admission.  Patient stable for discharge home.  They will return for any new or worse symptoms.    Shakura Cowing, MD 11/23/23 [phone]
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
## 2023-11-27 ENCOUNTER — Telehealth: Payer: Self-pay
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
## 2023-11-29 NOTE — Telephone Encounter (Signed)
 Please advise. The patients daughter states that her father is no longer a patient of Dr. Joshua he sees crawford.

## 2023-11-30 NOTE — Telephone Encounter (Signed)
 Did this get triaged? HR 40 is an urgent concern and needs assessment depending on symptoms office or Er may be appropriate

## 2023-11-30 NOTE — Telephone Encounter (Signed)
 Spoke with patients daughter wendy and she wanted to inform Dr rollene as well that her main concerns is about dad heart rate being a steady 40bpm and asked if you can advise on this if there is anything that is needing to be done

## 2023-12-01 NOTE — Telephone Encounter (Signed)
 I called the daughter and got him scheduled for this upcoming Monday with Dr norleen

## 2023-12-04 ENCOUNTER — Ambulatory Visit: Admitting: Internal Medicine

## 2023-12-04 ENCOUNTER — Encounter: Payer: Self-pay | Admitting: Internal Medicine

## 2023-12-04 VITALS — BP 122/54 | HR 51 | Temp 98.0°F | Ht 68.0 in | Wt 131.0 lb

## 2023-12-04 DIAGNOSIS — E538 Deficiency of other specified B group vitamins: Secondary | ICD-10-CM | POA: Diagnosis not present

## 2023-12-04 DIAGNOSIS — I10 Essential (primary) hypertension: Secondary | ICD-10-CM

## 2023-12-04 DIAGNOSIS — D649 Anemia, unspecified: Secondary | ICD-10-CM

## 2023-12-04 DIAGNOSIS — I739 Peripheral vascular disease, unspecified: Secondary | ICD-10-CM | POA: Diagnosis not present

## 2023-12-04 DIAGNOSIS — F04 Amnestic disorder due to known physiological condition: Secondary | ICD-10-CM | POA: Diagnosis not present

## 2023-12-04 DIAGNOSIS — Z7901 Long term (current) use of anticoagulants: Secondary | ICD-10-CM

## 2023-12-04 DIAGNOSIS — R55 Syncope and collapse: Secondary | ICD-10-CM

## 2023-12-04 MED ORDER — RIVAROXABAN 2.5 MG PO TABS: 2.5000 mg | ORAL_TABLET | Freq: Two times a day (BID) | ORAL | 3 refills | Status: AC

## 2023-12-04 MED ORDER — VITAMIN B-1 50 MG PO TABS: 50.0000 mg | ORAL_TABLET | Freq: Every day | ORAL | 3 refills | Status: AC

## 2023-12-04 MED ORDER — AMLODIPINE BESYLATE 5 MG PO TABS: 5.0000 mg | ORAL_TABLET | Freq: Every day | ORAL | 3 refills | Status: AC

## 2023-12-04 NOTE — Patient Instructions (Signed)
 Please continue all other medications as before, and refills have been done if requested.  Please have the pharmacy call with any other refills you may need.  Please continue your efforts at being more active, low cholesterol diet, and weight control.  Please keep your appointments with your specialists as you may have planned  Please go to ED for any worsening s/s

## 2023-12-04 NOTE — Assessment & Plan Note (Signed)
 Appears to a rare version from sneezing, ECG reviewed aug 14 sinus bradycardia (chronic stable), declines any further eval or tx

## 2023-12-04 NOTE — Assessment & Plan Note (Signed)
 I am concerned he has worsening iron def anemia on xarelto , but adamantly declines any further eval or tx including lab today

## 2023-12-04 NOTE — Assessment & Plan Note (Signed)
 BP Readings from Last 3 Encounters:  12/04/23 (!) 122/54  11/23/23 (!) 160/48  06/09/23 114/68   Stable, pt to continue medical treatment norvasc   5 qd

## 2023-12-04 NOTE — Progress Notes (Signed)
 Patient ID: Christopher Rubio., male   DOB: [birth year], 89 y.o.   MRN: [id number]        Chief Complaint: follow up recent fall, sinus bradycardia, anemia and hx of iron deficiency, ckd3a, hyperkalemia       HPI:  Christopher Rubio. is a 89 y.o. male here in the course of the visit stating he would not mind just going to sleep and not waking up, does not want aggressive tx or evaluation today.  Here to f/u post ED visit 8/14 after episode of syncope with fall with hard sneezing at the [facility].  Witnessed, no seizure.  No head strike,  Ct head negative for acute. Potassium 5.4 noted.  Also has recent hx of worsening noted on lab review today, has hx of iron deficiency 6 yrs ago, and now is on xarelto  2.5 bid.  Has not noticed any overt bleeding.           Wt Readings from Last 3 Encounters:  12/04/23 131 lb (59.4 kg)  11/23/23 130 lb (59 kg)  06/09/23 136 lb (61.7 kg)   BP Readings from Last 3 Encounters:  12/04/23 (!) 122/54  11/23/23 (!) 160/48  06/09/23 114/68         Past Medical History:  Diagnosis Date   Anemia    Atrial fibrillation (HCC)    Basal cell carcinoma of skin, site unspecified    Benign neoplasm of colon    Contact dermatitis and other eczema, due to unspecified cause    Diverticulosis of colon (without mention of hemorrhage)    Extrinsic asthma, unspecified    Gout, unspecified    Impotence of organic origin    Other and unspecified hyperlipidemia    Shortness of breath 08/19/2019   Unspecified essential hypertension    Past Surgical History:  Procedure Laterality Date   TONSILLECTOMY      reports that he has been smoking cigarettes. He has a 30 pack-year smoking history. He has been exposed to tobacco smoke. He has never used smokeless tobacco. He reports current alcohol use of about 10.0 standard drinks of alcohol per week. He reports that he does not use drugs. family history includes Bladder Cancer in his brother; Cancer in his brother; Coronary artery  disease (age of onset: 49) in his father; Heart attack in his father; Heart disease in his father; Hypertension in his mother; Stroke in his mother. Allergies  Allergen Reactions   Keflex [Cephalexin] Rash   Statins     myopathy   Codeine Nausea And Vomiting and Other (See Comments)    dizziness   Neomycin-Bacitracin Zn-Polymyx Other (See Comments)    Neosporin caused rash when covered with bandaid   Penicillins    Prednisone Other (See Comments)    Lips and tongue swelled up   Current Outpatient Medications on File Prior to Visit  Medication Sig Dispense Refill   Cholecalciferol (VITAMIN D) 2000 UNITS CAPS Take 2,000 Units by mouth daily.     ezetimibe  (ZETIA ) 10 MG tablet TAKE 1 TABLET BY MOUTH DAILY 90 tablet 0   silodosin  (RAPAFLO ) 4 MG CAPS capsule TAKE 1 CAPSULE BY MOUTH DAILY WITH BREAKFAST 90 capsule 0   tacrolimus  (PROTOPIC ) 0.1 % ointment Apply topically 2 (two) times daily. 100 g 3   tadalafil  (CIALIS ) 5 MG tablet TAKE 1 TABLET BY MOUTH DAILY AS NEEDED FOR ERECTILE DYSFUNCTION 10 tablet 0   No current facility-administered medications on file prior to visit.  ROS:  All others reviewed and negative.  Objective        PE:  BP (!) 122/54   Pulse (!) 51   Temp 98 F (36.7 C)   Ht 5' 8 (1.727 m)   Wt 131 lb (59.4 kg)   SpO2 96%   BMI 19.92 kg/m                 Constitutional: Pt appears in NAD               HENT: Head: NCAT.                Right Ear: External ear normal.                 Left Ear: External ear normal.                Eyes: . Pupils are equal, round, and reactive to light. Conjunctivae and EOM are normal               Nose: without d/c or deformity               Neck: Neck supple. Gross normal ROM               Cardiovascular: Normal rate and regular rhythm.                 Pulmonary/Chest: Effort normal and breath sounds without rales or wheezing.                Abd:  Soft, NT, ND, + BS, no organomegaly               Neurological: Pt is  alert. At baseline orientation, motor grossly intact               Skin: Skin is warm. No rashes, no other new lesions, LE edema - none               Psychiatric: Pt behavior is normal without agitation   Micro: none  Cardiac tracings I have personally interpreted today:  none  Pertinent Radiological findings (summarize): none   Lab Results  Component Value Date   WBC 9.8 11/23/2023   HGB 11.7 (L) 11/23/2023   HCT 35.8 (L) 11/23/2023   PLT 217 11/23/2023   GLUCOSE 113 (H) 11/23/2023   CHOL 199 02/22/2022   TRIG 162.0 (H) 02/22/2022   HDL 37.50 (L) 02/22/2022   LDLDIRECT 95.0 08/15/2019   LDLCALC 129 (H) 02/22/2022   ALT 13 11/23/2023   AST 24 11/23/2023   NA 137 11/23/2023   K 4.9 11/23/2023   CL 106 11/23/2023   CREATININE 1.67 (H) 11/23/2023   BUN 34 (H) 11/23/2023   CO2 20 (L) 11/23/2023   TSH 4.36 02/22/2022   PSA 2.40 12/11/2014   INR 1.3 (H) 11/23/2023   Assessment/Plan:  [name]. is a 89 y.o. White or Caucasian [1] male with  has a past medical history of Anemia, Atrial fibrillation (HCC), Basal cell carcinoma of skin, site unspecified, Benign neoplasm of colon, Contact dermatitis and other eczema, due to unspecified cause, Diverticulosis of colon (without mention of hemorrhage), Extrinsic asthma, unspecified, Gout, unspecified, Impotence of organic origin, Other and unspecified hyperlipidemia, Shortness of breath (08/19/2019), and Unspecified essential hypertension.  Syncope Appears to a rare version from sneezing, ECG reviewed aug 14 sinus bradycardia (chronic stable), declines any further eval or tx  HTN (hypertension) BP Readings from Last 3 Encounters:  12/04/23 (!) 122/54  11/23/23 (!) 160/48  06/09/23 114/68   Stable, pt to continue medical treatment norvasc   5 qd   Anemia I am concerned he has worsening iron def anemia on xarelto , but adamantly declines any further eval or tx including lab today  Chronic anticoagulation Pt will continue  the xarelto , watch for any s/s bleeding or worsening anemia, ED precautions given  Followup: Return if symptoms worsen or fail to improve.  Lynwood Rush, MD 12/04/2023 7:31 PM  Medical Group [facility]

## 2023-12-04 NOTE — Assessment & Plan Note (Signed)
 Pt will continue the xarelto , watch for any s/s bleeding or worsening anemia, ED precautions given

## 2024-01-27 ENCOUNTER — Other Ambulatory Visit: Payer: Self-pay | Admitting: Internal Medicine

## 2024-01-27 DIAGNOSIS — E785 Hyperlipidemia, unspecified: Secondary | ICD-10-CM

## 2024-01-27 DIAGNOSIS — I739 Peripheral vascular disease, unspecified: Secondary | ICD-10-CM

## 2024-01-27 DIAGNOSIS — N401 Enlarged prostate with lower urinary tract symptoms: Secondary | ICD-10-CM

## 2024-02-06 ENCOUNTER — Ambulatory Visit

## 2024-02-06 VITALS — BP 148/60 | HR 60 | Ht 64.0 in | Wt 131.8 lb

## 2024-02-06 DIAGNOSIS — Z0000 Encounter for general adult medical examination without abnormal findings: Secondary | ICD-10-CM | POA: Diagnosis not present

## 2024-02-06 DIAGNOSIS — Z23 Encounter for immunization: Secondary | ICD-10-CM | POA: Diagnosis not present

## 2024-02-06 NOTE — Patient Instructions (Addendum)
 Mr. Pasillas,  Thank you for taking the time for your Medicare Wellness Visit. I appreciate your continued commitment to your health goals. Please review the care plan we discussed, and feel free to reach out if I can assist you further.  Medicare recommends these wellness visits once per year to help you and your care team stay ahead of potential health issues. These visits are designed to focus on prevention, allowing your provider to concentrate on managing your acute and chronic conditions during your regular appointments.  Please note that Annual Wellness Visits do not include a physical exam. Some assessments may be limited, especially if the visit was conducted virtually. If needed, we may recommend a separate in-person follow-up with your provider.  Ongoing Care Seeing your primary care provider every 3 to 6 months helps us  monitor your health and provide consistent, personalized care.   Referrals If a referral was made during today's visit and you haven't received any updates within two weeks, please contact the referred provider directly to check on the status.  Recommended Screenings:  Health Maintenance  Topic Date Due   Zoster (Shingles) Vaccine (1 of 2) 01/26/1986   COVID-19 Vaccine (3 - 2025-26 season) 12/11/2023   Medicare Annual Wellness Visit  02/05/2025   DTaP/Tdap/Td vaccine (3 - Td or Tdap) 12/02/2029   Pneumococcal Vaccine for age over 67  Completed   Flu Shot  Completed   Meningitis B Vaccine  Aged Out       02/06/2024    2:16 PM  Advanced Directives  Does Patient Have a Medical Advance Directive? Yes  Type of Estate Agent of [place];Living will  Does patient want to make changes to medical advance directive? No - Patient declined  Copy of Healthcare Power of Attorney in Chart? Yes - validated most recent copy scanned in chart (See row information)   Advance Care Planning is important because it: Ensures you receive medical care that  aligns with your values, goals, and preferences. Provides guidance to your family and loved ones, reducing the emotional burden of decision-making during critical moments.  Vision: Annual vision screenings are recommended for early detection of glaucoma, cataracts, and diabetic retinopathy. These exams can also reveal signs of chronic conditions such as diabetes and high blood pressure.  Dental: Annual dental screenings help detect early signs of oral cancer, gum disease, and other conditions linked to overall health, including heart disease and diabetes.

## 2024-02-06 NOTE — Progress Notes (Signed)
 Subjective:   Christopher Rubio. is a 89 y.o. who presents for a Medicare Wellness preventive visit.  As a reminder, Annual Wellness Visits don't include a physical exam, and some assessments may be limited, especially if this visit is performed virtually. We may recommend an in-person follow-up visit with your provider if needed.  Visit Complete: In person  Persons Participating in Visit: Patient.  AWV Questionnaire: No: Patient Medicare AWV questionnaire was not completed prior to this visit.  Cardiac Risk Factors include: advanced age (>23men, >83 women);dyslipidemia;hypertension;male gender     Objective:    Today's Vitals   02/06/24 1422 02/06/24 1434  BP: (!) 148/62 (!) 148/60  Pulse: 60   SpO2: 96%   Weight: 131 lb 12.8 oz (59.8 kg)   Height: 5' 4 (1.626 m)    Body mass index is 22.62 kg/m.     02/06/2024    2:16 PM 11/23/2023    7:21 PM 07/03/2023    9:02 AM 03/18/2023   12:55 AM 02/08/2023   10:07 AM 03/25/2022   10:27 AM 03/19/2021   10:43 AM  Advanced Directives  Does Patient Have a Medical Advance Directive? Yes No No Yes Yes Yes Yes  Type of Estate Agent of [place];Living will   Living will;Healthcare Power of [employer] Power of [place];Living will Healthcare Power of [place];Living will Living will;Healthcare Power of Attorney  Does patient want to make changes to medical advance directive? No - Patient declined     No - Patient declined No - Patient declined  Copy of Healthcare Power of Attorney in Chart? Yes - validated most recent copy scanned in chart (See row information)    No - copy requested No - copy requested No - copy requested  Would patient like information on creating a medical advance directive?  No - Patient declined No - Patient declined        Current Medications (verified) Outpatient Encounter Medications as of 02/06/2024  Medication Sig   amLODipine  (NORVASC ) 5 MG tablet Take 1 tablet (5 mg total)  by mouth daily. NEED APPOINTMENT FOR REFILLS   Cholecalciferol (VITAMIN D) 2000 UNITS CAPS Take 2,000 Units by mouth daily.   ezetimibe  (ZETIA ) 10 MG tablet TAKE 1 TABLET BY MOUTH DAILY   rivaroxaban  (XARELTO ) 2.5 MG TABS tablet Take 1 tablet (2.5 mg total) by mouth 2 (two) times daily.   silodosin  (RAPAFLO ) 4 MG CAPS capsule TAKE 1 CAPSULE BY MOUTH DAILY WITH BREAKFAST   tacrolimus  (PROTOPIC ) 0.1 % ointment Apply topically 2 (two) times daily.   tadalafil  (CIALIS ) 5 MG tablet TAKE 1 TABLET BY MOUTH DAILY AS NEEDED FOR ERECTILE DYSFUNCTION   thiamine  (VITAMIN B-1) 50 MG tablet Take 1 tablet (50 mg total) by mouth daily.   No facility-administered encounter medications on file as of 02/06/2024.    Allergies (verified) Keflex [cephalexin], Statins, Codeine, Neomycin-bacitracin zn-polymyx, Penicillins, and Prednisone   History: Past Medical History:  Diagnosis Date   Anemia    Atrial fibrillation (HCC)    Basal cell carcinoma of skin, site unspecified    Benign neoplasm of colon    Contact dermatitis and other eczema, due to unspecified cause    Diverticulosis of colon (without mention of hemorrhage)    Extrinsic asthma, unspecified    Gout, unspecified    Impotence of organic origin    Other and unspecified hyperlipidemia    Shortness of breath 08/19/2019   Unspecified essential hypertension    Past Surgical History:  Procedure Laterality Date   TONSILLECTOMY     Family History  Problem Relation Age of Onset   Coronary artery disease Father 8       deceased   Heart attack Father    Heart disease Father    Stroke Mother    Hypertension Mother    Cancer Brother    Bladder Cancer Brother    Diabetes Neg Hx    Social History   Socioeconomic History   Marital status: Widowed    Spouse name: Not on file   Number of children: 2   Years of education: Not on file   Highest education level: Not on file  Occupational History   Occupation: retired runner, broadcasting/film/video  Tobacco Use    Smoking status: Every Day    Current packs/day: 0.50    Average packs/day: 0.5 packs/day for 60.0 years (30.0 ttl pk-yrs)    Types: Cigarettes    Passive exposure: Current   Smokeless tobacco: Never  Vaping Use   Vaping status: Never Used  Substance and Sexual Activity   Alcohol use: Yes    Alcohol/week: 10.0 standard drinks of alcohol    Types: 10 Cans of beer per week    Comment: Rarely drink anything   Drug use: No   Sexual activity: Not Currently  Other Topics Concern   Not on file  Social History Narrative   HSG, [employer], [employer]; [employer]. Married '62. 2 daughters - '67, '70: no grandchildren. Retired-college editor, commissioning. Enjoys retirement: golf, gardening, woodworking. Marriage in good health            Social Drivers of Health   Financial Resource Strain: Low Risk  (02/06/2024)   Overall Financial Resource Strain (CARDIA)    Difficulty of Paying Living Expenses: Not hard at all  Food Insecurity: No Food Insecurity (02/06/2024)   Hunger Vital Sign    Worried About Running Out of Food in the Last Year: Never true    Ran Out of Food in the Last Year: Never true  Transportation Needs: No Transportation Needs (02/06/2024)   PRAPARE - Administrator, Civil Service (Medical): No    Lack of Transportation (Non-Medical): No  Physical Activity: Sufficiently Active (02/06/2024)   Exercise Vital Sign    Days of Exercise per Week: 5 days    Minutes of Exercise per Session: 30 min  Stress: No Stress Concern Present (02/06/2024)   [employer] of Occupational Health - Occupational Stress Questionnaire    Feeling of Stress: Not at all  Social Connections: Moderately Isolated (02/06/2024)   Social Connection and Isolation Panel    Frequency of Communication with Friends and Family: Three times a week    Frequency of Social Gatherings with Friends and Family: Twice a week    Attends Religious Services: Never    Doctor, General Practice or Organizations: Yes    Attends Engineer, Structural: More than 4 times per year    Marital Status: Widowed    Tobacco Counseling Ready to quit: No Counseling given: Yes    Clinical Intake:  Pre-visit preparation completed: Yes  Pain : No/denies pain     BMI - recorded: 22.62 Nutritional Status: BMI of 19-24  Normal Nutritional Risks: None Diabetes: No  No results found for: HGBA1C   How often do you need to have someone help you when you read instructions, pamphlets, or other written materials from your doctor or pharmacy?: 1 - Never  Interpreter Needed?: No  Information entered by :: Verdie Saba, CMA   Activities of Daily Living     02/06/2024    2:25 PM 02/08/2023   10:42 AM  In your present state of health, do you have any difficulty performing the following activities:  Hearing? 0 0  Vision? 0 0  Difficulty concentrating or making decisions? 0 0  Walking or climbing stairs? 0 0  Dressing or bathing? 0 0  Doing errands, shopping? 0 0  Preparing Food and eating ? N N  Using the Toilet? N N  In the past six months, have you accidently leaked urine? N N  Do you have problems with loss of bowel control? N N  Managing your Medications? N N  Managing your Finances? N N  Housekeeping or managing your Housekeeping? N N    Patient Care Team: Rollene Almarie LABOR, MD as PCP - General (Internal Medicine) Early, Krystal FALCON, MD (Inactive) as Consulting Physician (Vascular Surgery)  I have updated your Care Teams any recent Medical Services you may have received from other providers in the past year.     Assessment:   This is a routine wellness examination for [place].  Hearing/Vision screen Hearing Screening - Comments:: Denies hearing difficulties   Vision Screening - Comments:: Denies vision concern - wears reading eyeglasses (denies referral to Optometrist)    Goals Addressed               This Visit's Progress     Patient Stated  (pt-stated)        Patient stated he plans to continue managing health       Depression Screen     02/06/2024    2:28 PM 07/03/2023    9:02 AM 06/09/2023   10:02 AM 02/08/2023   10:05 AM 03/25/2022   10:29 AM 03/19/2021   10:42 AM 02/06/2020    8:51 AM  PHQ 2/9 Scores  PHQ - 2 Score 0 0 0 1 1 0 0  PHQ- 9 Score 0   3       Fall Risk     02/06/2024    2:27 PM 07/03/2023    9:02 AM 06/09/2023   10:01 AM 02/08/2023   10:42 AM 03/25/2022   10:29 AM  Fall Risk   Falls in the past year? 0 0 0 0 0  Number falls in past yr: 0  0 0 0  Injury with Fall? 0  0 0 0  Risk for fall due to : No Fall Risks;Impaired balance/gait   No Fall Risks History of fall(s)  Follow up Falls evaluation completed;Falls prevention discussed  Falls evaluation completed Falls prevention discussed Falls evaluation completed      Data saved with a previous flowsheet row definition    MEDICARE RISK AT HOME:  Medicare Risk at Home Any stairs in or around the home?: No If so, are there any without handrails?: No Home free of loose throw rugs in walkways, pet beds, electrical cords, etc?: Yes Adequate lighting in your home to reduce risk of falls?: Yes Life alert?: Yes Use of a cane, walker or w/c?: Yes (cane) Grab bars in the bathroom?: Yes Shower chair or bench in shower?: No Elevated toilet seat or a handicapped toilet?: No  TIMED UP AND GO:  Was the test performed?  No  Cognitive Function: 6CIT completed    02/08/2023   10:07 AM 01/25/2018    1:12 PM 01/19/2017    9:24 AM 11/18/2015    8:56 AM  MMSE - Mini Mental State Exam  Not completed:    --  Orientation to time 5 5 5     Orientation to Place 5 5 5     Registration 3 3 3     Attention/ Calculation 5 5 5     Recall 3 2 2     Language- name 2 objects 2 2 2     Language- repeat 1 1 1    Language- follow 3 step command 3 3 3     Language- read & follow direction 1 1 1     Write a sentence 1 1 1     Copy design 1 1 1     Total score 30 29 29         Data saved with a previous flowsheet row definition        02/06/2024    2:30 PM 02/08/2023   10:07 AM 03/25/2022   10:27 AM 02/06/2020    8:54 AM 02/05/2019    8:33 AM  6CIT Screen  What Year? 0 points 0 points 0 points 0 points 0 points  What month? 0 points 0 points 0 points 0 points 0 points  What time? 0 points 0 points 0 points 0 points 0 points  Count back from 20 0 points 0 points 0 points 0 points 0 points  Months in reverse 0 points 0 points 0 points 0 points 0 points  Repeat phrase 0 points 0 points 0 points 0 points 0 points  Total Score 0 points 0 points 0 points 0 points 0 points    Immunizations Immunization History  Administered Date(s) Administered   Fluad Quad(high Dose 65+) 02/05/2019, 02/06/2020, 03/19/2021, 02/22/2022   Fluad Trivalent(High Dose 65+) 02/08/2023   INFLUENZA, HIGH DOSE SEASONAL PF 12/11/2014, 01/26/2016, 01/19/2017, 01/25/2018, 02/06/2024   Influenza,inj,Quad PF,6+ Mos 02/25/2013, 12/30/2013   Moderna Sars-Covid-2 Vaccination 06/27/2019, 07/25/2019   Pneumococcal Conjugate-13 03/13/2014   Pneumococcal Polysaccharide-23 12/26/2012, 12/03/2019   Td 08/10/2008   Tdap 12/03/2019   Zoster, Live 02/13/2010    Screening Tests Health Maintenance  Topic Date Due   Zoster Vaccines- Shingrix  (1 of 2) 01/26/1986   COVID-19 Vaccine (3 - 2025-26 season) 12/11/2023   Medicare Annual Wellness (AWV)  02/05/2025   DTaP/Tdap/Td (3 - Td or Tdap) 12/02/2029   Pneumococcal Vaccine: 50+ Years  Completed   Influenza Vaccine  Completed   Meningococcal B Vaccine  Aged Out    Health Maintenance Items Addressed:  Vaccines Given today: Influenza High-Dose  Additional Screening:  Vision Screening: Recommended annual ophthalmology exams for early detection of glaucoma and other disorders of the eye. Is the patient up to date with their annual eye exam?  No  Who is the provider or what is the name of the office in which the patient attends annual eye  exams? Patient declined referral  Dental Screening: Recommended annual dental exams for proper oral hygiene  Community Resource Referral / Chronic Care Management: CRR required this visit?  No   CCM required this visit?  No   Plan:    I have personally reviewed and noted the following in the patient's chart:   Medical and social history Use of alcohol, tobacco or illicit drugs  Current medications and supplements including opioid prescriptions. Patient is not currently taking opioid prescriptions. Functional ability and status Nutritional status Physical activity Advanced directives List of other physicians Hospitalizations, surgeries, and ER visits in previous 12 months Vitals Screenings to include cognitive, depression, and falls Referrals and appointments  In addition, I have reviewed and discussed  with patient certain preventive protocols, quality metrics, and best practice recommendations. A written personalized care plan for preventive services as well as general preventive health recommendations were provided to patient.   Verdie CHRISTELLA Saba, CMA   02/06/2024   After Visit Summary: (In Person-Declined) Patient declined AVS at this time.  Notes: Scheduled a CPE w/PCP for 02/2024.

## 2024-02-07 ENCOUNTER — Telehealth: Payer: Self-pay

## 2024-02-07 NOTE — Addendum Note (Signed)
 Addended by: GERALDENE VERDIE HERO on: 02/07/2024 11:56 AM   Modules accepted: Orders

## 2024-02-07 NOTE — Progress Notes (Signed)
 Subjective:   Christopher Nabor. is a 89 y.o. who presents for a Medicare Wellness preventive visit.  As a reminder, Annual Wellness Visits don't include a physical exam, and some assessments may be limited, especially if this visit is performed virtually. We may recommend an in-person follow-up visit with your provider if needed.  Visit Complete: In person  Persons Participating in Visit: Patient.  AWV Questionnaire: No: Patient Medicare AWV questionnaire was not completed prior to this visit.  Cardiac Risk Factors include: advanced age (>10men, >36 women);dyslipidemia;hypertension;male gender     Objective:    Today's Vitals   02/06/24 1422 02/06/24 1434  BP: (!) 148/62 (!) 148/60  Pulse: 60   SpO2: 96%   Weight: 131 lb 12.8 oz (59.8 kg)   Height: 5' 4 (1.626 m)    Body mass index is 22.62 kg/m.     02/06/2024    2:16 PM 11/23/2023    7:21 PM 07/03/2023    9:02 AM 03/18/2023   12:55 AM 02/08/2023   10:07 AM 03/25/2022   10:27 AM 03/19/2021   10:43 AM  Advanced Directives  Does Patient Have a Medical Advance Directive? Yes No No Yes Yes Yes Yes  Type of Estate Agent of [place];Living will   Living will;Healthcare Power of [employer] Power of [place];Living will Healthcare Power of [place];Living will Living will;Healthcare Power of Attorney  Does patient want to make changes to medical advance directive? No - Patient declined     No - Patient declined No - Patient declined  Copy of Healthcare Power of Attorney in Chart? Yes - validated most recent copy scanned in chart (See row information)    No - copy requested No - copy requested No - copy requested  Would patient like information on creating a medical advance directive?  No - Patient declined No - Patient declined        Current Medications (verified) Outpatient Encounter Medications as of 02/06/2024  Medication Sig   amLODipine  (NORVASC ) 5 MG tablet Take 1 tablet (5 mg total)  by mouth daily. NEED APPOINTMENT FOR REFILLS   Cholecalciferol (VITAMIN D) 2000 UNITS CAPS Take 2,000 Units by mouth daily.   ezetimibe  (ZETIA ) 10 MG tablet TAKE 1 TABLET BY MOUTH DAILY   losartan (COZAAR) 25 MG tablet Take 25 mg by mouth daily.   rivaroxaban  (XARELTO ) 2.5 MG TABS tablet Take 1 tablet (2.5 mg total) by mouth 2 (two) times daily.   silodosin  (RAPAFLO ) 4 MG CAPS capsule TAKE 1 CAPSULE BY MOUTH DAILY WITH BREAKFAST   tacrolimus  (PROTOPIC ) 0.1 % ointment Apply topically 2 (two) times daily.   tadalafil  (CIALIS ) 5 MG tablet TAKE 1 TABLET BY MOUTH DAILY AS NEEDED FOR ERECTILE DYSFUNCTION   thiamine  (VITAMIN B-1) 50 MG tablet Take 1 tablet (50 mg total) by mouth daily.   No facility-administered encounter medications on file as of 02/06/2024.    Allergies (verified) Keflex [cephalexin], Statins, Codeine, Neomycin-bacitracin zn-polymyx, Penicillins, and Prednisone   History: Past Medical History:  Diagnosis Date   Anemia    Atrial fibrillation (HCC)    Basal cell carcinoma of skin, site unspecified    Benign neoplasm of colon    Contact dermatitis and other eczema, due to unspecified cause    Diverticulosis of colon (without mention of hemorrhage)    Extrinsic asthma, unspecified    Gout, unspecified    Impotence of organic origin    Other and unspecified hyperlipidemia    Shortness of breath  08/19/2019   Unspecified essential hypertension    Past Surgical History:  Procedure Laterality Date   TONSILLECTOMY     Family History  Problem Relation Age of Onset   Coronary artery disease Father 36       deceased   Heart attack Father    Heart disease Father    Stroke Mother    Hypertension Mother    Cancer Brother    Bladder Cancer Brother    Diabetes Neg Hx    Social History   Socioeconomic History   Marital status: Widowed    Spouse name: Not on file   Number of children: 2   Years of education: Not on file   Highest education level: Not on file   Occupational History   Occupation: retired runner, broadcasting/film/video  Tobacco Use   Smoking status: Every Day    Current packs/day: 0.50    Average packs/day: 0.5 packs/day for 60.0 years (30.0 ttl pk-yrs)    Types: Cigarettes    Passive exposure: Current   Smokeless tobacco: Never  Vaping Use   Vaping status: Never Used  Substance and Sexual Activity   Alcohol use: Yes    Alcohol/week: 10.0 standard drinks of alcohol    Types: 10 Cans of beer per week    Comment: Rarely drink anything   Drug use: No   Sexual activity: Not Currently  Other Topics Concern   Not on file  Social History Narrative   HSG, [employer], [employer]; [employer]. Married '62. 2 daughters - '67, '70: no grandchildren. Retired-college editor, commissioning. Enjoys retirement: golf, gardening, woodworking. Marriage in good health            Social Drivers of Health   Financial Resource Strain: Low Risk  (02/06/2024)   Overall Financial Resource Strain (CARDIA)    Difficulty of Paying Living Expenses: Not hard at all  Food Insecurity: No Food Insecurity (02/06/2024)   Hunger Vital Sign    Worried About Running Out of Food in the Last Year: Never true    Ran Out of Food in the Last Year: Never true  Transportation Needs: No Transportation Needs (02/06/2024)   PRAPARE - Administrator, Civil Service (Medical): No    Lack of Transportation (Non-Medical): No  Physical Activity: Sufficiently Active (02/06/2024)   Exercise Vital Sign    Days of Exercise per Week: 5 days    Minutes of Exercise per Session: 30 min  Stress: No Stress Concern Present (02/06/2024)   [employer] of Occupational Health - Occupational Stress Questionnaire    Feeling of Stress: Not at all  Social Connections: Moderately Isolated (02/06/2024)   Social Connection and Isolation Panel    Frequency of Communication with Friends and Family: Three times a week    Frequency of Social Gatherings with Friends and Family:  Twice a week    Attends Religious Services: Never    Database Administrator or Organizations: Yes    Attends Engineer, Structural: More than 4 times per year    Marital Status: Widowed    Tobacco Counseling Ready to quit: No Counseling given: Yes    Clinical Intake:  Pre-visit preparation completed: Yes  Pain : No/denies pain     BMI - recorded: 22.62 Nutritional Status: BMI of 19-24  Normal Nutritional Risks: None Diabetes: No  No results found for: HGBA1C   How often do you need to have someone help you when you read instructions, pamphlets, or other written  materials from your doctor or pharmacy?: 1 - Never  Interpreter Needed?: No  Information entered by :: Christopher Rubio, CMA   Activities of Daily Living     02/06/2024    2:25 PM 02/08/2023   10:42 AM  In your present state of health, do you have any difficulty performing the following activities:  Hearing? 0 0  Vision? 0 0  Difficulty concentrating or making decisions? 0 0  Walking or climbing stairs? 0 0  Dressing or bathing? 0 0  Doing errands, shopping? 0 0  Preparing Food and eating ? N N  Using the Toilet? N N  In the past six months, have you accidently leaked urine? N N  Do you have problems with loss of bowel control? N N  Managing your Medications? N N  Managing your Finances? N N  Housekeeping or managing your Housekeeping? N N    Patient Care Team: Rollene Almarie LABOR, MD as PCP - General (Internal Medicine) Early, Krystal FALCON, MD (Inactive) as Consulting Physician (Vascular Surgery)  I have updated your Care Teams any recent Medical Services you may have received from other providers in the past year.     Assessment:   This is a routine wellness examination for [place].  Hearing/Vision screen Hearing Screening - Comments:: Denies hearing difficulties   Vision Screening - Comments:: Denies vision concern - wears reading eyeglasses (denies referral to Optometrist)    Goals  Addressed               This Visit's Progress     Patient Stated (pt-stated)        Patient stated he plans to continue managing health       Depression Screen     02/06/2024    2:28 PM 07/03/2023    9:02 AM 06/09/2023   10:02 AM 02/08/2023   10:05 AM 03/25/2022   10:29 AM 03/19/2021   10:42 AM 02/06/2020    8:51 AM  PHQ 2/9 Scores  PHQ - 2 Score 0 0 0 1 1 0 0  PHQ- 9 Score 0   3       Fall Risk     02/06/2024    2:27 PM 07/03/2023    9:02 AM 06/09/2023   10:01 AM 02/08/2023   10:42 AM 03/25/2022   10:29 AM  Fall Risk   Falls in the past year? 0 0 0 0 0  Number falls in past yr: 0  0 0 0  Injury with Fall? 0  0 0 0  Risk for fall due to : No Fall Risks;Impaired balance/gait   No Fall Risks History of fall(s)  Follow up Falls evaluation completed;Falls prevention discussed  Falls evaluation completed Falls prevention discussed Falls evaluation completed      Data saved with a previous flowsheet row definition    MEDICARE RISK AT HOME:  Medicare Risk at Home Any stairs in or around the home?: No If so, are there any without handrails?: No Home free of loose throw rugs in walkways, pet beds, electrical cords, etc?: Yes Adequate lighting in your home to reduce risk of falls?: Yes Life alert?: Yes Use of a cane, walker or w/c?: Yes (cane) Grab bars in the bathroom?: Yes Shower chair or bench in shower?: No Elevated toilet seat or a handicapped toilet?: No  TIMED UP AND GO:  Was the test performed?  No  Cognitive Function: 6CIT completed    02/08/2023   10:07 AM 01/25/2018  1:12 PM 01/19/2017    9:24 AM 11/18/2015    8:56 AM  MMSE - Mini Mental State Exam  Not completed:    --  Orientation to time 5 5 5     Orientation to Place 5 5 5     Registration 3 3 3     Attention/ Calculation 5 5 5     Recall 3 2 2     Language- name 2 objects 2 2 2     Language- repeat 1 1 1    Language- follow 3 step command 3 3 3     Language- read & follow direction 1 1 1      Write a sentence 1 1 1     Copy design 1 1 1     Total score 30 29 29        Data saved with a previous flowsheet row definition        02/06/2024    2:30 PM 02/08/2023   10:07 AM 03/25/2022   10:27 AM 02/06/2020    8:54 AM 02/05/2019    8:33 AM  6CIT Screen  What Year? 0 points 0 points 0 points 0 points 0 points  What month? 0 points 0 points 0 points 0 points 0 points  What time? 0 points 0 points 0 points 0 points 0 points  Count back from 20 0 points 0 points 0 points 0 points 0 points  Months in reverse 0 points 0 points 0 points 0 points 0 points  Repeat phrase 0 points 0 points 0 points 0 points 0 points  Total Score 0 points 0 points 0 points 0 points 0 points    Immunizations Immunization History  Administered Date(s) Administered   Fluad Quad(high Dose 65+) 02/05/2019, 02/06/2020, 03/19/2021, 02/22/2022   Fluad Trivalent(High Dose 65+) 02/08/2023   INFLUENZA, HIGH DOSE SEASONAL PF 12/11/2014, 01/26/2016, 01/19/2017, 01/25/2018, 02/06/2024   Influenza,inj,Quad PF,6+ Mos 02/25/2013, 12/30/2013   Moderna Sars-Covid-2 Vaccination 06/27/2019, 07/25/2019   Pneumococcal Conjugate-13 03/13/2014   Pneumococcal Polysaccharide-23 12/26/2012, 12/03/2019   Td 08/10/2008   Tdap 12/03/2019   Zoster, Live 02/13/2010    Screening Tests Health Maintenance  Topic Date Due   Zoster Vaccines- Shingrix  (1 of 2) 01/26/1986   COVID-19 Vaccine (3 - 2025-26 season) 12/11/2023   Medicare Annual Wellness (AWV)  02/05/2025   DTaP/Tdap/Td (3 - Td or Tdap) 12/02/2029   Pneumococcal Vaccine: 50+ Years  Completed   Influenza Vaccine  Completed   Meningococcal B Vaccine  Aged Out    Health Maintenance Items Addressed:  Vaccines Given today: Influenza High-Dose  Additional Screening:  Vision Screening: Recommended annual ophthalmology exams for early detection of glaucoma and other disorders of the eye. Is the patient up to date with their annual eye exam?  No  Who is the provider  or what is the name of the office in which the patient attends annual eye exams? Patient declined referral  Dental Screening: Recommended annual dental exams for proper oral hygiene  Community Resource Referral / Chronic Care Management: CRR required this visit?  No   CCM required this visit?  No   Plan:    I have personally reviewed and noted the following in the patient's chart:   Medical and social history Use of alcohol, tobacco or illicit drugs  Current medications and supplements including opioid prescriptions. Patient is not currently taking opioid prescriptions. Functional ability and status Nutritional status Physical activity Advanced directives List of other physicians Hospitalizations, surgeries, and ER visits in previous 12 months Vitals Screenings to include  cognitive, depression, and falls Referrals and appointments  In addition, I have reviewed and discussed with patient certain preventive protocols, quality metrics, and best practice recommendations. A written personalized care plan for preventive services as well as general preventive health recommendations were provided to patient.   Christopher CHRISTELLA Rubio, CMA   02/07/2024   After Visit Summary: (In Person-Declined) Patient declined AVS at this time.  Notes: Scheduled a CPE w/PCP for 02/2024. Pt called with the name of new med - Losartan 25mg .  NHA added to med list.

## 2024-02-07 NOTE — Telephone Encounter (Signed)
 Copied from CRM [id number]. Topic: General - Other >> Feb 07, 2024 10:35 AM Christopher Rubio wrote: Reason for CRM: pt called to spak to dr crawford nurse catrina rouse, reached out to cal, they adv she was with a pt, please reach out to pt at earliest convience. >> Feb 07, 2024 10:45 AM Christopher Rubio wrote: Pt says the dosage of the new medication is 25mg  the name is losartan potassium , he will also be available for nurse to call around 3pm

## 2024-02-20 ENCOUNTER — Ambulatory Visit (INDEPENDENT_AMBULATORY_CARE_PROVIDER_SITE_OTHER): Admitting: Internal Medicine

## 2024-02-20 ENCOUNTER — Encounter: Payer: Self-pay | Admitting: Internal Medicine

## 2024-02-20 VITALS — BP 120/60 | HR 63 | Temp 98.1°F | Ht 64.0 in | Wt 130.0 lb

## 2024-02-20 DIAGNOSIS — I739 Peripheral vascular disease, unspecified: Secondary | ICD-10-CM

## 2024-02-20 DIAGNOSIS — S81801A Unspecified open wound, right lower leg, initial encounter: Secondary | ICD-10-CM | POA: Diagnosis not present

## 2024-02-20 DIAGNOSIS — I10 Essential (primary) hypertension: Secondary | ICD-10-CM

## 2024-02-20 DIAGNOSIS — L2989 Other pruritus: Secondary | ICD-10-CM | POA: Diagnosis not present

## 2024-02-20 DIAGNOSIS — N1832 Chronic kidney disease, stage 3b: Secondary | ICD-10-CM | POA: Diagnosis not present

## 2024-02-20 DIAGNOSIS — E785 Hyperlipidemia, unspecified: Secondary | ICD-10-CM | POA: Diagnosis not present

## 2024-02-20 DIAGNOSIS — F17200 Nicotine dependence, unspecified, uncomplicated: Secondary | ICD-10-CM

## 2024-02-20 DIAGNOSIS — M1000 Idiopathic gout, unspecified site: Secondary | ICD-10-CM

## 2024-02-20 LAB — CBC
HCT: 32.7 % — ABNORMAL LOW (ref 39.0–52.0)
Hemoglobin: 11.1 g/dL — ABNORMAL LOW (ref 13.0–17.0)
MCHC: 34.1 g/dL (ref 30.0–36.0)
MCV: 87 fl (ref 78.0–100.0)
Platelets: 249 K/uL (ref 150.0–400.0)
RBC: 3.75 Mil/uL — ABNORMAL LOW (ref 4.22–5.81)
RDW: 14.7 % (ref 11.5–15.5)
WBC: 9.5 K/uL (ref 4.0–10.5)

## 2024-02-20 LAB — COMPREHENSIVE METABOLIC PANEL WITH GFR
ALT: 12 U/L (ref 0–53)
AST: 16 U/L (ref 0–37)
Albumin: 4 g/dL (ref 3.5–5.2)
Alkaline Phosphatase: 90 U/L (ref 39–117)
BUN: 34 mg/dL — ABNORMAL HIGH (ref 6–23)
CO2: 27 meq/L (ref 19–32)
Calcium: 9.4 mg/dL (ref 8.4–10.5)
Chloride: 103 meq/L (ref 96–112)
Creatinine, Ser: 1.68 mg/dL — ABNORMAL HIGH (ref 0.40–1.50)
GFR: 36.17 mL/min — ABNORMAL LOW (ref >60.00)
Glucose, Bld: 124 mg/dL — ABNORMAL HIGH (ref 70–99)
Potassium: 5 meq/L (ref 3.5–5.1)
Sodium: 139 meq/L (ref 135–145)
Total Bilirubin: 0.5 mg/dL (ref 0.2–1.2)
Total Protein: 7 g/dL (ref 6.0–8.3)

## 2024-02-20 LAB — FERRITIN: Ferritin: 32.1 ng/mL (ref 22.0–322.0)

## 2024-02-20 LAB — LIPID PANEL
Cholesterol: 175 mg/dL (ref 0–200)
HDL: 32.4 mg/dL — ABNORMAL LOW (ref >39.00)
LDL Cholesterol: 95 mg/dL (ref 0–99)
NonHDL: 142.62
Total CHOL/HDL Ratio: 5
Triglycerides: 239 mg/dL — ABNORMAL HIGH (ref 0.0–149.0)
VLDL: 47.8 mg/dL — ABNORMAL HIGH (ref 0.0–40.0)

## 2024-02-20 LAB — URIC ACID: Uric Acid, Serum: 7.2 mg/dL (ref 4.0–7.8)

## 2024-02-20 NOTE — Progress Notes (Unsigned)
 Subjective:   Patient ID: Christopher LITTIE Othel Mickey., male    DOB: 12-07-1933, 89 y.o.   MRN: [id number]  Discussed the use of AI scribe software for clinical note transcription with the patient, who gave verbal consent to proceed.  History of Present Illness Christopher Laduca. is an 89 year old male with chronic kidney disease who presents for medication management and evaluation of multiple symptoms.  He has chronic kidney disease and was previously prescribed losartan in addition to amlodipine  for blood pressure management. He questions the necessity of both medications and notes stable kidney function without significant decline. Blood pressure readings have been stable, typically around 130s/80s, with a recent reading of 120/60.  He describes a change in taste sensation over the past five years, noting his taste buds are 'not there like they used to be.' Additionally, he experiences thicker saliva, described as 'more mucousy,' which makes it difficult to spit at times.  He has developed an itchy scalp over the last year or two and mentions using a pet eczema product that seems to help.  He experiences occasional rectal leakage, which he attributes to potential muscle weakness, and is interested in dietary changes to increase fiber intake to manage this issue.  He experiences easy bruising, which he attributes to blood thinner use and thinning skin with age. Even minor bumps result in significant bruising.  He has a history of basal cell carcinoma and is concerned about a slow-healing wound on his leg, resulting from an injury while carrying a mattress box.  He reports two episodes of unexplained skin eruptions over the past five years, which were evaluated by dermatologists but remained inconclusive. He uses dry skin cream regularly.  He maintains an active lifestyle, engaging in daily walks and outdoor activities such as gardening and building, which he believes helps maintain his  breathing and leg health. No new chest pain, breathing difficulties, or changes in breathing.  Print and mail labs Review of Systems  Constitutional: Negative.   HENT: Negative.         Itchy scalp  Eyes: Negative.   Respiratory:  Negative for cough, chest tightness and shortness of breath.   Cardiovascular:  Negative for chest pain, palpitations and leg swelling.  Gastrointestinal:  Negative for abdominal distention, abdominal pain, constipation, diarrhea, nausea and vomiting.  Musculoskeletal: Negative.   Skin:  Positive for rash and wound.  Neurological: Negative.   Psychiatric/Behavioral: Negative.      Objective:  Physical Exam Constitutional:      Appearance: He is well-developed.  HENT:     Head: Normocephalic and atraumatic.  Cardiovascular:     Rate and Rhythm: Normal rate and regular rhythm.  Pulmonary:     Effort: Pulmonary effort is normal. No respiratory distress.     Breath sounds: Normal breath sounds. No wheezing or rales.  Abdominal:     General: Bowel sounds are normal. There is no distension.     Palpations: Abdomen is soft.     Tenderness: There is no abdominal tenderness.  Musculoskeletal:     Cervical back: Normal range of motion.  Skin:    General: Skin is warm and dry.     Comments: Wound right shin which appears to be healing but high risk for transformation to scc  Neurological:     Mental Status: He is alert and oriented to person, place, and time.     Coordination: Coordination normal.     Vitals:   02/20/24 1437  BP: 120/60  Pulse: 63  Temp: 98.1 F (36.7 C)  TempSrc: Oral  SpO2: 97%  Weight: 130 lb (59 kg)  Height: 5' 4 (1.626 m)    Assessment and Plan Assessment & Plan Chronic kidney disease, stage 3b   Kidney function is well-managed with losartan, and blood pressure is controlled. Losartan's potential renal effects were discussed. Continue losartan and amlodipine , monitor blood pressure for hypotension, and kidney function  tests have been ordered.  Peripheral vascular disease   Continue Xarelto .  Easy bruising due to anticoagulation and age-related skin changes   Bruising is attributed to Xarelto  and skin thinning, with normal blood counts. Continue current anticoagulation therapy.  Pruritic scalp   The condition is likely age-related with no fungal infection. Recommended using Selsun Blue or a similar medicated shampoo.  Age-related taste disturbance and thickened saliva   These are age-related changes with no significant concerns. Consider citrusy foods to stimulate saliva production.  Mild fecal incontinence   Possibly due to muscle weakness. Dietary fiber and pelvic exercises were discussed. Increase dietary fiber intake and consider Metamucil for additional fiber.  Chronic dry skin   Managed with moisturizing cream and no new concerns. Continue using dry skin cream twice daily.  Healing skin wound, lower body   The wound is healing with no infection. Skin cancer risk was discussed if non-healing. Monitor the wound for changes and consider a dermatology referral if no improvement as this looks high risk for progression to scc

## 2024-02-20 NOTE — Patient Instructions (Addendum)
 Selsun blue for the scalp.  Try benefiber or metamucil  eat fiber in food to help with the stomach.

## 2024-02-21 ENCOUNTER — Ambulatory Visit: Payer: Self-pay | Admitting: Internal Medicine

## 2024-02-22 DIAGNOSIS — S81801A Unspecified open wound, right lower leg, initial encounter: Secondary | ICD-10-CM | POA: Insufficient documentation

## 2024-02-22 NOTE — Assessment & Plan Note (Signed)
 The condition is likely age-related with no fungal infection. Recommended using Selsun Blue or a similar medicated shampoo.

## 2024-02-22 NOTE — Assessment & Plan Note (Signed)
 The wound is healing with no infection. Skin cancer risk was discussed if non-healing. Monitor the wound for changes and consider a dermatology referral if no improvement as this looks high risk for progression to scc

## 2024-02-22 NOTE — Assessment & Plan Note (Signed)
 Kidney function is well-managed with losartan, and blood pressure is controlled. Losartan's potential renal effects were discussed. Continue losartan and amlodipine , monitor blood pressure for hypotension, and kidney function tests have been ordered.

## 2024-02-22 NOTE — Assessment & Plan Note (Signed)
 Checking uric acid and adjust as needed.

## 2024-02-22 NOTE — Assessment & Plan Note (Signed)
 Continue xarelto

## 2024-02-22 NOTE — Assessment & Plan Note (Signed)
 Checking lipid panel and adjust as needed.

## 2024-02-22 NOTE — Assessment & Plan Note (Signed)
 Counseled to quit

## 2024-02-22 NOTE — Assessment & Plan Note (Signed)
BP at goal and checking CMP and adjust as needed.

## 2024-02-26 DIAGNOSIS — Z7901 Long term (current) use of anticoagulants: Secondary | ICD-10-CM | POA: Diagnosis not present

## 2024-02-26 DIAGNOSIS — Z85828 Personal history of other malignant neoplasm of skin: Secondary | ICD-10-CM | POA: Diagnosis not present

## 2024-02-26 DIAGNOSIS — I4891 Unspecified atrial fibrillation: Secondary | ICD-10-CM | POA: Diagnosis not present

## 2024-02-26 DIAGNOSIS — N400 Enlarged prostate without lower urinary tract symptoms: Secondary | ICD-10-CM | POA: Diagnosis not present

## 2024-02-26 DIAGNOSIS — D6869 Other thrombophilia: Secondary | ICD-10-CM | POA: Diagnosis not present

## 2024-02-26 DIAGNOSIS — N1832 Chronic kidney disease, stage 3b: Secondary | ICD-10-CM | POA: Diagnosis not present

## 2024-02-26 DIAGNOSIS — I129 Hypertensive chronic kidney disease with stage 1 through stage 4 chronic kidney disease, or unspecified chronic kidney disease: Secondary | ICD-10-CM | POA: Diagnosis not present

## 2024-02-26 DIAGNOSIS — I700 Atherosclerosis of aorta: Secondary | ICD-10-CM | POA: Diagnosis not present

## 2024-02-26 DIAGNOSIS — F1021 Alcohol dependence, in remission: Secondary | ICD-10-CM | POA: Diagnosis not present

## 2024-03-20 ENCOUNTER — Encounter: Payer: Self-pay | Admitting: Pharmacist

## 2024-03-20 NOTE — Progress Notes (Signed)
 Pharmacy Quality Measure Review  This patient is appearing on a report for being at risk of failing the adherence measure for hypertension (ACEi/ARB) medications this calendar year.   Medication: Losartan Last fill date: 02/26/24 for 90 day supply  Insurance report was not up to date. No action needed at this time.   Darrelyn Drum, PharmD, BCPS, CPP Clinical Pharmacist Practitioner [facility] [phone]

## 2025-02-21 ENCOUNTER — Encounter: Admitting: Internal Medicine

## 2025-02-21 ENCOUNTER — Ambulatory Visit
